# Patient Record
Sex: Male | Born: 1945 | Race: White | Hispanic: No | Marital: Married | Smoking: Former smoker
Health system: Southern US, Community
[De-identification: ages and names within clinical notes are randomized; demographics above are authoritative.]

## PROBLEM LIST (undated history)

## (undated) DIAGNOSIS — E785 Hyperlipidemia, unspecified: Secondary | ICD-10-CM

## (undated) DIAGNOSIS — I10 Essential (primary) hypertension: Secondary | ICD-10-CM

## (undated) DIAGNOSIS — I482 Chronic atrial fibrillation, unspecified: Secondary | ICD-10-CM

## (undated) DIAGNOSIS — J449 Chronic obstructive pulmonary disease, unspecified: Secondary | ICD-10-CM

## (undated) HISTORY — PX: TONSILLECTOMY: SUR1361

## (undated) HISTORY — PX: APPENDECTOMY: SHX54

## (undated) HISTORY — PX: HERNIA REPAIR: SHX51

---

## 1997-05-12 HISTORY — PX: FLEXIBLE SIGMOIDOSCOPY: SHX1649

## 2001-04-14 ENCOUNTER — Encounter: Payer: Self-pay | Admitting: Internal Medicine

## 2001-04-14 ENCOUNTER — Encounter: Admission: RE | Admit: 2001-04-14 | Discharge: 2001-04-14 | Payer: Self-pay | Admitting: Internal Medicine

## 2001-10-30 ENCOUNTER — Encounter: Payer: Self-pay | Admitting: Otolaryngology

## 2001-10-30 ENCOUNTER — Encounter: Admission: RE | Admit: 2001-10-30 | Discharge: 2001-10-30 | Payer: Self-pay | Admitting: Otolaryngology

## 2003-07-06 ENCOUNTER — Encounter: Payer: Self-pay | Admitting: Internal Medicine

## 2004-10-30 ENCOUNTER — Ambulatory Visit: Payer: Self-pay | Admitting: Internal Medicine

## 2004-10-31 ENCOUNTER — Ambulatory Visit: Payer: Self-pay | Admitting: Internal Medicine

## 2004-11-28 ENCOUNTER — Ambulatory Visit: Payer: Self-pay | Admitting: Internal Medicine

## 2005-02-26 ENCOUNTER — Ambulatory Visit: Payer: Self-pay | Admitting: Internal Medicine

## 2005-12-08 ENCOUNTER — Emergency Department (HOSPITAL_COMMUNITY): Admission: EM | Admit: 2005-12-08 | Discharge: 2005-12-08 | Payer: Self-pay | Admitting: Emergency Medicine

## 2005-12-21 ENCOUNTER — Encounter: Admission: RE | Admit: 2005-12-21 | Discharge: 2005-12-21 | Payer: Self-pay | Admitting: Orthopaedic Surgery

## 2006-01-14 ENCOUNTER — Ambulatory Visit: Payer: Self-pay | Admitting: Internal Medicine

## 2006-01-14 LAB — CONVERTED CEMR LAB
ALT: 29 units/L (ref 0–40)
AST: 27 units/L (ref 0–37)
BUN: 10 mg/dL (ref 6–23)
Basophils Relative: 1.2 % — ABNORMAL HIGH (ref 0.0–1.0)
Calcium: 9.4 mg/dL (ref 8.4–10.5)
Chloride: 103 meq/L (ref 96–112)
Cholesterol: 153 mg/dL (ref 0–200)
Creatinine, Ser: 1 mg/dL (ref 0.4–1.5)
Eosinophil percent: 2.1 % (ref 0.0–5.0)
GFR calc non Af Amer: 82 mL/min
HCT: 43 % (ref 39.0–52.0)
HDL: 62.5 mg/dL (ref >39.0)
Hemoglobin: 14.7 g/dL (ref 13.0–17.0)
Lymphocytes Relative: 28.1 % (ref 12.0–46.0)
MCHC: 34.3 g/dL (ref 30.0–36.0)
MCV: 98.2 fL (ref 78.0–100.0)
Monocytes Absolute: 0.6 10*3/uL (ref 0.2–0.7)
Neutro Abs: 3.4 10*3/uL (ref 1.4–7.7)
Neutrophils Relative %: 59 % (ref 43.0–77.0)
PSA: 0.26 ng/mL (ref 0.10–4.00)
Triglyceride fasting, serum: 77 mg/dL (ref 0–149)
VLDL: 15 mg/dL (ref 0–40)
WBC: 5.9 10*3/uL (ref 4.5–10.5)

## 2007-01-26 ENCOUNTER — Telehealth (INDEPENDENT_AMBULATORY_CARE_PROVIDER_SITE_OTHER): Payer: Self-pay | Admitting: *Deleted

## 2007-02-02 ENCOUNTER — Ambulatory Visit: Payer: Self-pay | Admitting: Internal Medicine

## 2007-02-02 DIAGNOSIS — N138 Other obstructive and reflux uropathy: Secondary | ICD-10-CM

## 2007-02-02 DIAGNOSIS — I4891 Unspecified atrial fibrillation: Secondary | ICD-10-CM

## 2007-02-02 DIAGNOSIS — N401 Enlarged prostate with lower urinary tract symptoms: Secondary | ICD-10-CM

## 2007-02-02 DIAGNOSIS — J45909 Unspecified asthma, uncomplicated: Secondary | ICD-10-CM

## 2007-02-03 ENCOUNTER — Encounter (INDEPENDENT_AMBULATORY_CARE_PROVIDER_SITE_OTHER): Payer: Self-pay | Admitting: *Deleted

## 2007-02-10 ENCOUNTER — Encounter (INDEPENDENT_AMBULATORY_CARE_PROVIDER_SITE_OTHER): Payer: Self-pay | Admitting: *Deleted

## 2007-02-10 ENCOUNTER — Ambulatory Visit: Payer: Self-pay | Admitting: Internal Medicine

## 2007-02-12 ENCOUNTER — Ambulatory Visit: Payer: Self-pay | Admitting: Cardiovascular Disease

## 2007-02-19 ENCOUNTER — Ambulatory Visit: Payer: Self-pay

## 2007-02-19 ENCOUNTER — Ambulatory Visit: Payer: Self-pay | Admitting: Cardiology

## 2007-02-19 ENCOUNTER — Encounter: Payer: Self-pay | Admitting: Cardiovascular Disease

## 2007-02-19 ENCOUNTER — Encounter: Payer: Self-pay | Admitting: Internal Medicine

## 2007-02-26 ENCOUNTER — Ambulatory Visit: Payer: Self-pay | Admitting: Cardiology

## 2007-03-05 ENCOUNTER — Ambulatory Visit: Payer: Self-pay | Admitting: Cardiology

## 2007-03-11 ENCOUNTER — Ambulatory Visit: Payer: Self-pay | Admitting: Cardiology

## 2007-03-20 ENCOUNTER — Ambulatory Visit: Payer: Self-pay | Admitting: Internal Medicine

## 2007-03-27 ENCOUNTER — Ambulatory Visit: Payer: Self-pay | Admitting: Cardiovascular Disease

## 2007-03-31 ENCOUNTER — Ambulatory Visit: Payer: Self-pay | Admitting: Cardiovascular Disease

## 2007-04-06 ENCOUNTER — Ambulatory Visit (HOSPITAL_COMMUNITY): Admission: RE | Admit: 2007-04-06 | Discharge: 2007-04-06 | Payer: Self-pay | Admitting: Cardiovascular Disease

## 2007-04-06 ENCOUNTER — Ambulatory Visit: Payer: Self-pay | Admitting: Cardiovascular Disease

## 2007-04-15 ENCOUNTER — Ambulatory Visit: Payer: Self-pay | Admitting: Cardiovascular Disease

## 2007-04-15 ENCOUNTER — Ambulatory Visit: Payer: Self-pay | Admitting: Internal Medicine

## 2007-04-24 ENCOUNTER — Ambulatory Visit: Payer: Self-pay

## 2007-05-06 ENCOUNTER — Ambulatory Visit: Payer: Self-pay | Admitting: Cardiovascular Disease

## 2007-05-20 ENCOUNTER — Ambulatory Visit: Payer: Self-pay | Admitting: Cardiology

## 2007-05-20 ENCOUNTER — Ambulatory Visit: Payer: Self-pay | Admitting: Cardiovascular Disease

## 2007-05-27 ENCOUNTER — Ambulatory Visit (HOSPITAL_COMMUNITY): Admission: RE | Admit: 2007-05-27 | Discharge: 2007-05-27 | Payer: Self-pay | Admitting: Cardiovascular Disease

## 2007-05-27 ENCOUNTER — Ambulatory Visit: Payer: Self-pay | Admitting: Cardiovascular Disease

## 2007-06-03 ENCOUNTER — Ambulatory Visit: Payer: Self-pay | Admitting: Cardiology

## 2007-06-08 ENCOUNTER — Ambulatory Visit: Payer: Self-pay | Admitting: Cardiovascular Disease

## 2007-07-01 ENCOUNTER — Ambulatory Visit: Payer: Self-pay | Admitting: Cardiovascular Disease

## 2007-07-23 ENCOUNTER — Ambulatory Visit: Payer: Self-pay | Admitting: Cardiology

## 2007-08-21 ENCOUNTER — Ambulatory Visit: Payer: Self-pay | Admitting: Cardiovascular Disease

## 2007-09-18 ENCOUNTER — Ambulatory Visit: Payer: Self-pay | Admitting: Internal Medicine

## 2007-10-16 ENCOUNTER — Ambulatory Visit: Payer: Self-pay | Admitting: Internal Medicine

## 2007-11-13 ENCOUNTER — Ambulatory Visit: Payer: Self-pay | Admitting: Plastic Surgery

## 2007-12-11 ENCOUNTER — Ambulatory Visit: Payer: Self-pay | Admitting: Cardiovascular Disease

## 2007-12-11 ENCOUNTER — Ambulatory Visit: Payer: Self-pay | Admitting: Internal Medicine

## 2008-01-11 ENCOUNTER — Telehealth (INDEPENDENT_AMBULATORY_CARE_PROVIDER_SITE_OTHER): Payer: Self-pay | Admitting: *Deleted

## 2008-01-12 ENCOUNTER — Encounter (INDEPENDENT_AMBULATORY_CARE_PROVIDER_SITE_OTHER): Payer: Self-pay | Admitting: *Deleted

## 2008-01-12 ENCOUNTER — Ambulatory Visit: Payer: Self-pay | Admitting: Cardiology

## 2008-01-18 ENCOUNTER — Ambulatory Visit: Payer: Self-pay | Admitting: Internal Medicine

## 2008-01-18 LAB — CONVERTED CEMR LAB: HDL goal, serum: 40 mg/dL

## 2008-01-19 ENCOUNTER — Telehealth (INDEPENDENT_AMBULATORY_CARE_PROVIDER_SITE_OTHER): Payer: Self-pay | Admitting: *Deleted

## 2008-01-20 ENCOUNTER — Ambulatory Visit: Payer: Self-pay | Admitting: Internal Medicine

## 2008-01-26 ENCOUNTER — Ambulatory Visit: Payer: Self-pay | Admitting: Internal Medicine

## 2008-01-26 LAB — CONVERTED CEMR LAB
OCCULT 2: NEGATIVE
OCCULT 3: NEGATIVE

## 2008-01-27 ENCOUNTER — Encounter (INDEPENDENT_AMBULATORY_CARE_PROVIDER_SITE_OTHER): Payer: Self-pay | Admitting: *Deleted

## 2008-02-02 ENCOUNTER — Encounter (INDEPENDENT_AMBULATORY_CARE_PROVIDER_SITE_OTHER): Payer: Self-pay | Admitting: *Deleted

## 2008-02-02 LAB — CONVERTED CEMR LAB
AST: 31 units/L (ref 0–37)
Alkaline Phosphatase: 52 units/L (ref 39–117)
BUN: 12 mg/dL (ref 6–23)
CO2: 30 meq/L (ref 19–32)
Chloride: 104 meq/L (ref 96–112)
Eosinophils Absolute: 0.1 10*3/uL (ref 0.0–0.7)
Eosinophils Relative: 0.9 % (ref 0.0–5.0)
GFR calc non Af Amer: 73 mL/min
HDL: 51.6 mg/dL (ref >39.0)
LDL Cholesterol: 90 mg/dL (ref 0–99)
Lymphocytes Relative: 27.8 % (ref 12.0–46.0)
MCV: 97.8 fL (ref 78.0–100.0)
Neutrophils Relative %: 58.6 % (ref 43.0–77.0)
Platelets: 209 10*3/uL (ref 150–400)
Potassium: 4.4 meq/L (ref 3.5–5.1)
Total Bilirubin: 1.1 mg/dL (ref 0.3–1.2)
Total CHOL/HDL Ratio: 3
VLDL: 14 mg/dL (ref 0–40)
WBC: 6.4 10*3/uL (ref 4.5–10.5)

## 2008-02-09 ENCOUNTER — Ambulatory Visit: Payer: Self-pay | Admitting: Cardiology

## 2008-03-21 ENCOUNTER — Ambulatory Visit: Payer: Self-pay | Admitting: Cardiovascular Disease

## 2008-03-21 ENCOUNTER — Encounter: Payer: Self-pay | Admitting: Cardiovascular Disease

## 2008-04-18 ENCOUNTER — Ambulatory Visit: Payer: Self-pay | Admitting: Internal Medicine

## 2008-05-16 ENCOUNTER — Ambulatory Visit: Payer: Self-pay | Admitting: Internal Medicine

## 2008-06-07 ENCOUNTER — Encounter: Payer: Self-pay | Admitting: *Deleted

## 2008-06-13 ENCOUNTER — Ambulatory Visit: Payer: Self-pay | Admitting: Cardiology

## 2008-06-13 LAB — CONVERTED CEMR LAB: POC INR: 2.4

## 2008-07-12 ENCOUNTER — Ambulatory Visit: Payer: Self-pay | Admitting: Cardiology

## 2008-07-12 ENCOUNTER — Encounter (INDEPENDENT_AMBULATORY_CARE_PROVIDER_SITE_OTHER): Payer: Self-pay | Admitting: Cardiology

## 2008-07-12 LAB — CONVERTED CEMR LAB: Prothrombin Time: 22.7 s

## 2008-07-13 ENCOUNTER — Encounter: Payer: Self-pay | Admitting: *Deleted

## 2008-08-09 ENCOUNTER — Ambulatory Visit: Payer: Self-pay | Admitting: Cardiovascular Disease

## 2008-09-06 ENCOUNTER — Ambulatory Visit: Payer: Self-pay | Admitting: Cardiovascular Disease

## 2008-10-04 ENCOUNTER — Ambulatory Visit: Payer: Self-pay | Admitting: Cardiology

## 2008-11-01 ENCOUNTER — Ambulatory Visit: Payer: Self-pay | Admitting: Cardiology

## 2008-11-29 ENCOUNTER — Ambulatory Visit: Payer: Self-pay | Admitting: Cardiology

## 2008-11-29 LAB — CONVERTED CEMR LAB: POC INR: 2.6

## 2008-12-27 ENCOUNTER — Ambulatory Visit: Payer: Self-pay | Admitting: Cardiology

## 2008-12-27 LAB — CONVERTED CEMR LAB: POC INR: 2.7

## 2009-01-07 HISTORY — PX: COLONOSCOPY: SHX174

## 2009-01-16 ENCOUNTER — Telehealth (INDEPENDENT_AMBULATORY_CARE_PROVIDER_SITE_OTHER): Payer: Self-pay | Admitting: *Deleted

## 2009-01-23 ENCOUNTER — Ambulatory Visit: Payer: Self-pay | Admitting: Internal Medicine

## 2009-01-23 DIAGNOSIS — Z8739 Personal history of other diseases of the musculoskeletal system and connective tissue: Secondary | ICD-10-CM

## 2009-01-23 DIAGNOSIS — E785 Hyperlipidemia, unspecified: Secondary | ICD-10-CM | POA: Insufficient documentation

## 2009-01-24 ENCOUNTER — Encounter (INDEPENDENT_AMBULATORY_CARE_PROVIDER_SITE_OTHER): Payer: Self-pay | Admitting: *Deleted

## 2009-02-03 ENCOUNTER — Ambulatory Visit: Payer: Self-pay | Admitting: Gastroenterology

## 2009-02-03 ENCOUNTER — Encounter (INDEPENDENT_AMBULATORY_CARE_PROVIDER_SITE_OTHER): Payer: Self-pay | Admitting: *Deleted

## 2009-02-15 ENCOUNTER — Ambulatory Visit: Payer: Self-pay | Admitting: Gastroenterology

## 2009-02-24 ENCOUNTER — Ambulatory Visit: Payer: Self-pay | Admitting: Cardiology

## 2009-03-14 ENCOUNTER — Telehealth (INDEPENDENT_AMBULATORY_CARE_PROVIDER_SITE_OTHER): Payer: Self-pay | Admitting: *Deleted

## 2009-03-24 ENCOUNTER — Ambulatory Visit: Payer: Self-pay | Admitting: Cardiology

## 2009-04-21 ENCOUNTER — Ambulatory Visit: Payer: Self-pay | Admitting: Internal Medicine

## 2009-04-21 LAB — CONVERTED CEMR LAB: POC INR: 1.9

## 2009-05-19 ENCOUNTER — Ambulatory Visit: Payer: Self-pay | Admitting: Cardiovascular Disease

## 2009-06-16 ENCOUNTER — Ambulatory Visit: Payer: Self-pay | Admitting: Cardiology

## 2009-07-14 ENCOUNTER — Ambulatory Visit: Payer: Self-pay | Admitting: Cardiovascular Disease

## 2009-08-11 ENCOUNTER — Ambulatory Visit: Payer: Self-pay | Admitting: Cardiovascular Disease

## 2009-08-11 LAB — CONVERTED CEMR LAB: POC INR: 2

## 2009-09-13 ENCOUNTER — Encounter: Payer: Self-pay | Admitting: Internal Medicine

## 2009-09-14 ENCOUNTER — Ambulatory Visit: Payer: Self-pay | Admitting: Cardiovascular Disease

## 2009-09-14 ENCOUNTER — Ambulatory Visit: Payer: Self-pay | Admitting: Internal Medicine

## 2009-09-14 LAB — CONVERTED CEMR LAB: POC INR: 2.2

## 2009-10-11 ENCOUNTER — Ambulatory Visit: Payer: Self-pay | Admitting: Cardiovascular Disease

## 2009-10-11 LAB — CONVERTED CEMR LAB: POC INR: 2.6

## 2009-11-09 ENCOUNTER — Ambulatory Visit: Payer: Self-pay | Admitting: Cardiology

## 2009-11-09 LAB — CONVERTED CEMR LAB: POC INR: 2.5

## 2009-12-07 ENCOUNTER — Ambulatory Visit: Payer: Self-pay | Admitting: Cardiology

## 2009-12-07 LAB — CONVERTED CEMR LAB: POC INR: 2.5

## 2010-01-11 ENCOUNTER — Ambulatory Visit: Admission: RE | Admit: 2010-01-11 | Discharge: 2010-01-11 | Payer: Self-pay | Source: Home / Self Care

## 2010-01-11 LAB — CONVERTED CEMR LAB: POC INR: 2.6

## 2010-01-26 ENCOUNTER — Encounter: Payer: Self-pay | Admitting: Internal Medicine

## 2010-01-26 ENCOUNTER — Ambulatory Visit
Admission: RE | Admit: 2010-01-26 | Discharge: 2010-01-26 | Payer: Self-pay | Source: Home / Self Care | Attending: Internal Medicine | Admitting: Internal Medicine

## 2010-01-26 DIAGNOSIS — J309 Allergic rhinitis, unspecified: Secondary | ICD-10-CM | POA: Insufficient documentation

## 2010-02-04 LAB — CONVERTED CEMR LAB
ALT: 29 units/L (ref 0–53)
Albumin: 4 g/dL (ref 3.5–5.2)
BUN: 13 mg/dL (ref 6–23)
Basophils Absolute: 0 10*3/uL (ref 0.0–0.1)
Basophils Absolute: 0.1 10*3/uL (ref 0.0–0.1)
Bilirubin, Direct: 0.2 mg/dL (ref 0.0–0.3)
Bilirubin, Direct: 0.3 mg/dL (ref 0.0–0.3)
CK-MB: 1.8 ng/mL (ref 0.3–4.0)
CO2: 28 meq/L (ref 19–32)
Chloride: 103 meq/L (ref 96–112)
Cholesterol: 135 mg/dL (ref 0–200)
Cholesterol: 164 mg/dL (ref 0–200)
Creatinine, Ser: 1.1 mg/dL (ref 0.4–1.5)
Eosinophils Absolute: 0.1 10*3/uL (ref 0.0–0.6)
Eosinophils Absolute: 0.1 10*3/uL (ref 0.0–0.7)
Eosinophils Relative: 1.2 % (ref 0.0–5.0)
Free T4: 0.7 ng/dL (ref 0.6–1.6)
GFR calc Af Amer: 88 mL/min
GFR calc non Af Amer: 73 mL/min
Glucose, Bld: 107 mg/dL — ABNORMAL HIGH (ref 70–99)
Glucose, Bld: 83 mg/dL (ref 70–99)
HCT: 44.7 % (ref 39.0–52.0)
HDL goal, serum: 40 mg/dL
Hemoglobin: 15.7 g/dL (ref 13.0–17.0)
INR: 2.5
LDL Goal: 160 mg/dL
Lymphocytes Relative: 21.7 % (ref 12.0–46.0)
Lymphocytes Relative: 25.7 % (ref 12.0–46.0)
MCHC: 33.3 g/dL (ref 30.0–36.0)
MCHC: 33.7 g/dL (ref 30.0–36.0)
MCV: 97.4 fL (ref 78.0–100.0)
Neutro Abs: 3.9 10*3/uL (ref 1.4–7.7)
Neutro Abs: 4.9 10*3/uL (ref 1.4–7.7)
Neutrophils Relative %: 62.9 % (ref 43.0–77.0)
PSA: 0.21 ng/mL (ref 0.10–4.00)
Potassium: 4.7 meq/L (ref 3.5–5.1)
RBC: 4.59 M/uL (ref 4.22–5.81)
RDW: 12.1 % (ref 11.5–14.6)
Sodium: 138 meq/L (ref 135–145)
TSH: 1.24 microintl units/mL (ref 0.35–5.50)
TSH: 1.4 microintl units/mL (ref 0.35–5.50)
Total CHOL/HDL Ratio: 2.4
Total CK: 78 units/L (ref 7–195)
Total Protein: 7.2 g/dL (ref 6.0–8.3)
Triglycerides: 55 mg/dL (ref 0–149)
Triglycerides: 83 mg/dL (ref 0.0–149.0)
Uric Acid, Serum: 8.3 mg/dL — ABNORMAL HIGH (ref 4.0–7.8)
WBC: 6.2 10*3/uL (ref 4.5–10.5)

## 2010-02-06 ENCOUNTER — Ambulatory Visit: Admission: RE | Admit: 2010-02-06 | Discharge: 2010-02-06 | Payer: Self-pay | Source: Home / Self Care

## 2010-02-06 ENCOUNTER — Ambulatory Visit: Admit: 2010-02-06 | Payer: Self-pay | Admitting: Internal Medicine

## 2010-02-06 ENCOUNTER — Ambulatory Visit
Admission: RE | Admit: 2010-02-06 | Discharge: 2010-02-06 | Payer: Self-pay | Source: Home / Self Care | Attending: Internal Medicine | Admitting: Internal Medicine

## 2010-02-06 ENCOUNTER — Other Ambulatory Visit: Payer: Self-pay | Admitting: Internal Medicine

## 2010-02-06 LAB — CBC WITH DIFFERENTIAL/PLATELET
Basophils Absolute: 0 10*3/uL (ref 0.0–0.1)
Eosinophils Absolute: 0.1 10*3/uL (ref 0.0–0.7)
Lymphocytes Relative: 15.3 % (ref 12.0–46.0)
MCHC: 34.7 g/dL (ref 30.0–36.0)
Neutrophils Relative %: 73.7 % (ref 43.0–77.0)
Platelets: 218 10*3/uL (ref 150.0–400.0)
RBC: 4.18 Mil/uL — ABNORMAL LOW (ref 4.22–5.81)
RDW: 12 % (ref 11.5–14.6)

## 2010-02-06 LAB — BASIC METABOLIC PANEL
Calcium: 8.9 mg/dL (ref 8.4–10.5)
Creatinine, Ser: 1 mg/dL (ref 0.4–1.5)
GFR: 79.32 mL/min (ref >60.00)
Glucose, Bld: 91 mg/dL (ref 70–99)
Sodium: 138 mEq/L (ref 135–145)

## 2010-02-06 LAB — HEPATIC FUNCTION PANEL
ALT: 21 U/L (ref 0–53)
AST: 25 U/L (ref 0–37)
Albumin: 3.7 g/dL (ref 3.5–5.2)
Alkaline Phosphatase: 52 U/L (ref 39–117)

## 2010-02-06 LAB — PSA: PSA: 0.37 ng/mL (ref 0.10–4.00)

## 2010-02-06 LAB — LIPID PANEL
HDL: 50.9 mg/dL (ref >39.00)
Triglycerides: 65 mg/dL (ref 0.0–149.0)

## 2010-02-06 NOTE — Medication Information (Signed)
Summary: rov/cb  Anticoagulant Therapy  Managed by: [name], PharmD Referring MD: [name] PCP: [name], MD Supervising MD: Eden Emms MD, Theron Arista Indication 1: Atrial Fibrillation (ICD-427.31) Lab Used: [facility] Site: [facility] INR POC 2.2 INR RANGE 2 - 3  Dietary changes: no    Health status changes: no    Bleeding/hemorrhagic complications: no    Recent/future hospitalizations: no    Any changes in medication regimen? no    Recent/future dental: no  Any missed doses?: no       Is patient compliant with meds? yes       Allergies: No Known Drug Allergies  Anticoagulation Management History:      The patient is taking warfarin and comes in today for a routine follow up visit.  Negative risk factors for bleeding include an age less than 41 years old and no history of CVA/TIA.  The bleeding index is 'low risk'.  Positive CHADS2 values include History of HTN.  Negative CHADS2 values include Age > 89 years old, History of Diabetes, and Prior Stroke/CVA/TIA.  The start date was 02/12/2007.  His last INR was 2.5.  Anticoagulation responsible provider: Eden Emms MD, Theron Arista.  INR POC: 2.2.  Cuvette Lot#: [id number].  Exp: 09/2010.    Anticoagulation Management Assessment/Plan:      The patient's current anticoagulation dose is Coumadin 5 mg tabs: Use as directed by anticoagulation clinic..  The target INR is 2 - 3.  The next INR is due 08/11/2009.  Anticoagulation instructions were given to patient.  Results were reviewed/authorized by [name], PharmD.  He was notified by [name] PharmD.         Prior Anticoagulation Instructions: INR 2.6. Take 1 tablet daily except 1.5 tablets on Sat.  Recheck in 4 weeks.  Current Anticoagulation Instructions: INR 2.2  Continue same dose of 1 tablet every day except 1 1/ 2 tablets on Saturday.

## 2010-02-06 NOTE — Letter (Signed)
Summary: Care Consideration Regarding Pulmonary Rehab/[facility]  Care Consideration Regarding Pulmonary Rehab/[facility]   Imported By: Lanelle Bal 10/02/2009 12:57:22  _____________________________________________________________________  External Attachment:    Type:   Image     Comment:   External Document

## 2010-02-06 NOTE — Medication Information (Signed)
Summary: rov/sp  Anticoagulant Therapy  Managed by: [name], PharmD Referring MD: [name] PCP: [name], MD Supervising MD: Eden Emms MD, Theron Arista Indication 1: Atrial Fibrillation (ICD-427.31) Lab Used: [facility] Site: [facility] INR POC 2.0 INR RANGE 2 - 3  Dietary changes: no    Health status changes: no    Bleeding/hemorrhagic complications: no    Recent/future hospitalizations: no    Any changes in medication regimen? no    Recent/future dental: no  Any missed doses?: no       Is patient compliant with meds? yes       Allergies: No Known Drug Allergies  Anticoagulation Management History:      The patient is taking warfarin and comes in today for a routine follow up visit.  Negative risk factors for bleeding include an age less than 66 years old and no history of CVA/TIA.  The bleeding index is 'low risk'.  Positive CHADS2 values include History of HTN.  Negative CHADS2 values include Age > 21 years old, History of Diabetes, and Prior Stroke/CVA/TIA.  The start date was 02/12/2007.  His last INR was 2.5.  Anticoagulation responsible Obe Ahlers: Eden Emms MD, Theron Arista.  INR POC: 2.0.  Cuvette Lot#: [id number].  Exp: 10/2010.    Anticoagulation Management Assessment/Plan:      The patient's current anticoagulation dose is Coumadin 5 mg tabs: Use as directed by anticoagulation clinic..  The target INR is 2 - 3.  The next INR is due 09/14/2009.  Anticoagulation instructions were given to patient.  Results were reviewed/authorized by [name], PharmD.  He was notified by Gweneth Fritter, PharmD Candidate.         Prior Anticoagulation Instructions: INR 2.2  Continue same dose of 1 tablet every day except 1 1/ 2 tablets on Saturday.   Current Anticoagulation Instructions: INR- 2.0  Continue taking 1 tablet (5mg ) daily except take 1.5 tablets (7.5mg ) on Sat.

## 2010-02-06 NOTE — Assessment & Plan Note (Signed)
Summary: 6 mo f/u /cy  Medications Added LIPITOR 20 MG  TABS (ATORVASTATIN CALCIUM) 1 tablet mon ,wed, fri, sunday ASPIRIN 81 MG  TABS (ASPIRIN) 1 tab by mouth once daily      Allergies Added: NKDA  Primary Provider:  [name], MD  CC:  pt has been having some muscle pain.  History of Present Illness: Reginald Green is seen today in followup.   Patient has chronic atrial fibrillation.  He has failed multiple cardioversions in the past. We  have not  referred him for afib ablatin in the past  since he is asymptomatic and doing well   He has been in afib for about 3 years.  We discussed  Pradaxa as well and until there is an "antidote" I would not change especially since his INR's have been very consistant  He bruises easily on Coumadin He had a benign colonoscopy recently and no problems off coumadin with no bridge In February of 09 he had a normal stress Myoview and a normal echo.  Is not having any significant chest pain palpitations PND orthopnea dyspnea no syncope lower extremity edema or claudication.  His hypertension has been under good control.  Is trying to watch the salt in his diet.  Current Problems (verified): 1)  Gout  (ICD-274.9) 2)  Hyperlipidemia  (ICD-272.4) 3)  Coumadin Therapy  (ICD-V58.61) 4)  Atrial Fibrillation  (ICD-427.31) 5)  Special Screening For Malignant Neoplasms Colon  (ICD-V76.51) 6)  Hyperplasia Prostate Uns w/o Ur Obst & Oth Luts  (ICD-600.90) 7)  Atrial Fibrillation  (ICD-427.31) 8)  Other and Unspecified Hyperlipidemia  (ICD-272.4) 9)  Unspecified Essential Hypertension  (ICD-401.9) 10)  Asthma  (ICD-493.90)  Current Medications (verified): 1)  Advair Diskus 250-50 Mcg/dose  Misc (Fluticasone-Salmeterol) .... Inhale 1 Puff By Mouth Every 12 Hours, 2)  Lisinopril 40 Mg  Tabs (Lisinopril) .... 1/2 Tab Daily 3)  Metoprolol Tartrate 50 Mg  Tabs (Metoprolol Tartrate) .... 1/2 By Mouth Two Times A Day 4)  Lipitor 20 Mg  Tabs (Atorvastatin Calcium) .Marland Kitchen.. 1  Tablet Mon ,wed, Fri, Sunday 5)  Aspirin 81 Mg  Tabs (Aspirin) .Marland Kitchen.. 1 Tab By Mouth Once Daily 6)  Proair Hfa 108 (90 Base) Mcg/act  Aers (Albuterol Sulfate) .Marland Kitchen.. 1-2 Puffs Q 4-6 Hrs Prn 7)  Multivitamins  Tabs (Multiple Vitamin) .Marland Kitchen.. 1 By Mouth Once Daily 8)  Coumadin 5 Mg Tabs (Warfarin Sodium) .... Use As Directed By Anticoagulation Clinic.  Allergies (verified): No Known Drug Allergies  Past History:  Past Medical History: Last updated: 02/03/2009 atrial fibrillation PVC and  excess hypertensive response at treadmill stress test 12/25/1998 Hypertension elevated uric acid, PMH of Gilberts syndrome false positive HIV 2004 @ [facility] Hyperlipidemia Tinnitus Gout COPD  Past Surgical History: Last updated: 02/03/2009 hernia repair 1979 undescended testicle-testicle remans, it was brought down at herniorrhaphy 1979; incidental appendectomy done @ same surgery 1979 Tonsillectomy colonoscopy negative 2001, due 2011 dislocated left shoulder  S/P Physical Therapy 12/2005 Cardioversions , multiple, unsuccessful, Dr Eden Emms Appendectomy  Family History: Last updated: 02/03/2009 maternal uncle 2 vessel CABG, carotid endarectomy mother CVA ,endarterectomy, HTN maternal grandmotherCVA, diabetes maternal grandfather MI @ 10; M uncle MI <55,CBAG No FH of Colon Cancer: Family History of Colon Polyps:Mother Family History of Heart Disease: Father  Social History: Last updated: 02/03/2009 Former Smoker: quit age 61 Alcohol use-yes occasionally Regular exercise-yes: walking 2-3 mpd > 3X/week Occupation: Emergency planning/management officer Daily Caffeine Use 2-3  Review of Systems  Denies fever, malais, weight loss, blurry vision, decreased visual acuity, cough, sputum, SOB, hemoptysis, pleuritic pain, palpitaitons, heartburn, abdominal pain, melena, lower extremity edema, claudication, or rash.   Vital Signs:  Patient profile:   65 year old male Height:      74 inches Weight:       223 pounds BMI:     28.73 Pulse rate:   70 / minute Pulse rhythm:   irregularly irregular Resp:     14 per minute BP sitting:   118 / 80  (left arm)  Vitals Entered By: Kem Parkinson (September 14, 2009 3:42 PM)  Physical Exam  General:  Affect appropriate Healthy:  appears stated age HEENT: normal Neck supple with no adenopathy JVP normal no bruits no thyromegaly Lungs clear with no wheezing and good diaphragmatic motion Heart:  S1/S2 no murmur,rub, gallop or click PMI normal Abdomen: benighn, BS positve, no tenderness, no AAA no bruit.  No HSM or HJR Distal pulses intact with no bruits No edema Neuro non-focal Skin warm and dry    Impression & Recommendations:  Problem # 1:  HYPERLIPIDEMIA (ICD-272.4) At goal with no side effects His updated medication list for this problem includes:    Lipitor 20 Mg Tabs (Atorvastatin calcium) .Marland Kitchen... 1 tablet mon ,wed, fri, sunday  CHOL: 164 (01/23/2009)   LDL: 84 (01/23/2009)   HDL: 63.00 (01/23/2009)   TG: 83.0 (01/23/2009) CHOL (goal): 200 (01/18/2008)   LDL (goal): 120 (01/18/2008)   HDL (goal): 40 (01/18/2008)   TG (goal): 150 (01/18/2008)  Problem # 2:  COUMADIN THERAPY (ICD-V58.61) F/U coumadin clinic today.  No bleeding problems  Problem # 3:  ATRIAL FIBRILLATION (ICD-427.31) Good rate control and anticoagulaiton  Asymptomatic His updated medication list for this problem includes:    Metoprolol Tartrate 50 Mg Tabs (Metoprolol tartrate) .Marland Kitchen... 1/2 by mouth two times a day    Aspirin 81 Mg Tabs (Aspirin) .Marland Kitchen... 1 tab by mouth once daily    Coumadin 5 Mg Tabs (Warfarin sodium) ..... Use as directed by anticoagulation clinic.  Problem # 4:  UNSPECIFIED ESSENTIAL HYPERTENSION (ICD-401.9) Well controlled continue low sodium diet His updated medication list for this problem includes:    Lisinopril 40 Mg Tabs (Lisinopril) .Marland Kitchen... 1/2 tab daily    Metoprolol Tartrate 50 Mg Tabs (Metoprolol tartrate) .Marland Kitchen... 1/2 by mouth two times a  day    Aspirin 81 Mg Tabs (Aspirin) .Marland Kitchen... 1 tab by mouth once daily  Patient Instructions: 1)  Your physician recommends that you schedule a follow-up appointment in: ONE YEAR

## 2010-02-06 NOTE — Medication Information (Signed)
Summary: rov/nb   Anticoagulant Therapy  Managed by: [name], PharmD Referring MD: [name] PCP: [name], MD Supervising MD: Jens Som MD, Arlys Aldous Indication 1: Atrial Fibrillation (ICD-427.31) Lab Used: [facility] Site: [facility] INR POC 2.5 INR RANGE 2 - 3  Dietary changes: no    Health status changes: no    Bleeding/hemorrhagic complications: no    Recent/future hospitalizations: no    Any changes in medication regimen? no    Recent/future dental: no  Any missed doses?: no       Is patient compliant with meds? yes       Allergies: No Known Drug Allergies  Anticoagulation Management History:      The patient is taking warfarin and comes in today for a routine follow up visit.  Negative risk factors for bleeding include an age less than 42 years old and no history of CVA/TIA.  The bleeding index is 'low risk'.  Positive CHADS2 values include History of HTN.  Negative CHADS2 values include Age > 67 years old, History of Diabetes, and Prior Stroke/CVA/TIA.  The start date was 02/12/2007.  His last INR was 2.5.  Anticoagulation responsible provider: Jens Som MD, Arlys Mourad.  INR POC: 2.5.  Cuvette Lot#: [id number].  Exp: 12/2010.    Anticoagulation Management Assessment/Plan:      The patient's current anticoagulation dose is Coumadin 5 mg tabs: Use as directed by anticoagulation clinic..  The target INR is 2 - 3.  The next INR is due 01/11/2010.  Anticoagulation instructions were given to patient.  Results were reviewed/authorized by [name], PharmD.  He was notified by [name] PharmD.         Prior Anticoagulation Instructions: INR 2.5  Continue previous dose of 1 tablet daily except 1.5 tablets on Saturday.  Recheck INR in 4 weeks.   Current Anticoagulation Instructions: INR 2.5  Continue same dose of 1 tablet every day except 1 1/2 tablets on Saturday.  Recheck INR in 4 weeks.

## 2010-02-06 NOTE — Letter (Signed)
Summary: [facility] Instructions  [facility]  [address] [place], [place] [address]   Phone: [phone]  Fax: [phone]       Reginald Green    12/19/1945    MRN: [id number]        Procedure Day Dorna Bloom: Wednesday, 02/15/09     Arrival Time: 10;00      Procedure Time: 11;00     Location of Procedure:                    _x _   [facility] ([facility])                        PREPARATION FOR COLONOSCOPY WITH MOVIPREP   Starting 5 days prior to your procedure 02/10/09 do not eat nuts, seeds, popcorn, corn, beans, peas,  salads, or any raw vegetables.  Do not take any fiber supplements (e.g. Metamucil, Citrucel, and Benefiber).  THE DAY BEFORE YOUR PROCEDURE         DATE: 02/14/09   DAY: Tuesday  1.  Drink clear liquids the entire day-NO SOLID FOOD  2.  Do not drink anything colored red or purple.  Avoid juices with pulp.  No orange juice.  3.  Drink at least 64 oz. (8 glasses) of fluid/clear liquids during the day to prevent dehydration and help the prep work efficiently.  CLEAR LIQUIDS INCLUDE: Water Jello Ice Popsicles Tea (sugar ok, no milk/cream) Powdered fruit flavored drinks Coffee (sugar ok, no milk/cream) Gatorade Juice: apple, white grape, white cranberry  Lemonade Clear bullion, consomm, broth Carbonated beverages (any kind) Strained chicken noodle soup Hard Candy                             4.  In the morning, mix first dose of MoviPrep solution:    Empty 1 Pouch A and 1 Pouch B into the disposable container    Add lukewarm drinking water to the top line of the container. Mix to dissolve    Refrigerate (mixed solution should be used within 24 hrs)  5.  Begin drinking the prep at 5:00 p.m. The MoviPrep container is divided by 4 marks.   Every 15 minutes drink the solution down to the next mark (approximately 8 oz) until the full liter is complete.   6.  Follow completed prep with 16 oz of clear liquid of your choice (Nothing  red or purple).  Continue to drink clear liquids until bedtime.  7.  Before going to bed, mix second dose of MoviPrep solution:    Empty 1 Pouch A and 1 Pouch B into the disposable container    Add lukewarm drinking water to the top line of the container. Mix to dissolve    Refrigerate  THE DAY OF YOUR PROCEDURE      DATE: 02/15/09  DAY: Wednesday  Beginning at 6:00 a.m. (5 hours before procedure):         1. Every 15 minutes, drink the solution down to the next mark (approx 8 oz) until the full liter is complete.  2. Follow completed prep with 16 oz. of clear liquid of your choice.    3. You may drink clear liquids until 9:00 (2 HOURS BEFORE PROCEDURE).   MEDICATION INSTRUCTIONS  Unless otherwise instructed, you should take regular prescription medications with a small sip of water   as early as possible the morning of  your procedure.     Stop taking Coumadin on  02/10/09  (5 days before procedure).           OTHER INSTRUCTIONS  You will need a responsible adult at least 63 years of age to accompany you and drive you home.   This person must remain in the waiting room during your procedure.  Wear loose fitting clothing that is easily removed.  Leave jewelry and other valuables at home.  However, you may wish to bring a book to read or  an iPod/MP3 player to listen to music as you wait for your procedure to start.  Remove all body piercing jewelry and leave at home.  Total time from sign-in until discharge is approximately 2-3 hours.  You should go home directly after your procedure and rest.  You can resume normal activities the  day after your procedure.  The day of your procedure you should not:   Drive   Make legal decisions   Operate machinery   Drink alcohol   Return to work  You will receive specific instructions about eating, activities and medications before you leave.    The above instructions have been reviewed and explained to me by    _______________________    I fully understand and can verbalize these instructions _____________________________ Date _________

## 2010-02-06 NOTE — Progress Notes (Signed)
Summary: refill  Phone Note Refill Request Message from:  Fax from Pharmacy on [address] fax [phone]  Refills Requested: Medication #1:  METOPROLOL TARTRATE 50 MG  TABS 1/2 by mouth two times a day Initial call taken by: Barb Merino,  January 16, 2009 8:37 AM  Follow-up for Phone Call        pt has pending OV 01-24-08............Marland KitchenFelecia Deloach CMA  January 16, 2009 11:01 AM     New/Updated Medications: METOPROLOL TARTRATE 50 MG  TABS (METOPROLOL TARTRATE) 1/2 by mouth two times a day*OFFICE VISIT DUE NOW** Prescriptions: METOPROLOL TARTRATE 50 MG  TABS (METOPROLOL TARTRATE) 1/2 by mouth two times a day*OFFICE VISIT DUE NOW**  #30 x 0   Entered by:   Jeremy Johann CMA   Authorized by:   [name] MD   Signed by:   Jeremy Johann CMA on 01/16/2009   Method used:   Faxed to ...       [facility]* (retail)       [address] [place], [place]  [address]       Ph: [phone]       Fax: [phone]   RxID:   [id number]

## 2010-02-06 NOTE — Assessment & Plan Note (Signed)
Summary: med refiill//fd   Vital Signs:  Patient profile:   65 year old male Height:      74 inches Weight:      227 pounds BMI:     29.25 Resp:     15 per minute BP sitting:   130 / 84  (left arm) Cuff size:   large  Vitals Entered By: Shonna Chock (January 23, 2009 10:48 AM) CC: CPX with fasting labs and refill medications, Hypertension Management Comments REVIEWED MED LIST, PATIENT AGREED DOSE AND INSTRUCTION CORRECT    CC:  CPX with fasting labs and refill medications and Hypertension Management.  History of Present Illness: Reginald Green is here for med refill;he is asymptomatic.  Hypertension History:      He complains of neurologic problems, but denies headache, chest pain, palpitations, dyspnea with exertion, orthopnea, PND, peripheral edema, visual symptoms, syncope, and side effects from treatment.  BP @ home usually 130/75-80. Minor epistaxis. No claudication.        Positive major cardiovascular risk factors include male age 66 years old or older, hyperlipidemia, hypertension, and family history for ischemic heart disease (males less than 49 years old).  Negative major cardiovascular risk factors include no history of diabetes and non-tobacco-user status.        Further assessment for target organ damage reveals no history of ASHD, stroke/TIA, or peripheral vascular disease.     Allergies (verified): No Known Drug Allergies  Past History:  Past Medical History: atrial fibrillation PVC and  excess hypertensive response at treadmill stress test 12/25/1998 Hypertension elevated uric acid, PMH of Gilberts syndrome false positive HIV 2004 @ [facility] Hyperlipidemia Tinnitus Gout  Past Surgical History: hernia repair 1979 undescended testicle-testicle remans, it was brought down at herniorrhaphy 1979; incidental appendectomy done @ same surgery 1979 Tonsillectomy colonoscopy negative 2001, due 2011 dislocated left shoulder  S/P Physical Therapy  12/2005 Cardioversions , multiple, unsuccessful, Dr Eden Emms  Family History: maternal uncle 2 vessel CABG, carotid endarectomy mother CVA ,endarterectomy, HTN maternal grandmotherCVA, diabetes maternal grandfather MI @ 47; M uncle MI <55,CBAG  Social History: Former Smoker: quit age 82 Alcohol use-yes occasionally Regular exercise-yes: walking 2-3 mpd > 3X/week Occupation: Emergency planning/management officer  Review of Systems  The patient denies anorexia, fever, weight loss, weight gain, decreased hearing, hoarseness, abdominal pain, melena, hematochezia, severe indigestion/heartburn, hematuria, incontinence, suspicious skin lesions, depression, unusual weight change, abnormal bleeding, enlarged lymph nodes, and angioedema.   Eyes:  Denies blurring, double vision, and vision loss-both eyes. CV:  Denies leg cramps with exertion, lightheadness, and near fainting. Resp:  Complains of sputum productive; denies cough, excessive snoring, hypersomnolence, morning headaches, shortness of breath, and wheezing; Rarely uses rescue MDI. Phlegm in am & with walking. Neuro:  Complains of numbness and tingling; denies disturbances in coordination and poor balance; Occasional N&T in hands @ night.  Physical Exam  General:  well-nourished,in no acute distress; alert,appropriate and cooperative throughout examination Head:  Normocephalic and atraumatic without obvious abnormalities. No apparent alopecia; moustache &  beard Neck:  No deformities, masses, or tenderness noted. Lungs:  Normal respiratory effort, chest expands symmetrically. Lungs are clear to auscultation, no crackles or wheezes. Heart:  bradycardia and irregular rhythm.   Abdomen:  Bowel sounds positive,abdomen soft and non-tender without masses, organomegaly or hernias noted. Rectal:  No external abnormalities noted. Normal sphincter tone. No rectal masses or tenderness. Genitalia:  R testicle is high.L varicocele.   Prostate:  no nodules, no asymmetry, no  induration, but  1+ enlarged.  Pulses:  R and L carotid,radial  and posterior tibial pulses are full and equal bilaterally. Decreased DPP Extremities:  No clubbing, cyanosis, edema. Deformed toenails Neurologic:  alert & oriented X3 and DTRs symmetrical and normal.   Skin:  Intact without suspicious lesions or rashes Cervical Nodes:  No lymphadenopathy noted Axillary Nodes:  No palpable lymphadenopathy Psych:  memory intact for recent and remote, normally interactive, and good eye contact.     Impression & Recommendations:  Problem # 1:  HYPERLIPIDEMIA (ICD-272.4)  His updated medication list for this problem includes:    Lipitor 20 Mg Tabs (Atorvastatin calcium) .Marland Kitchen... 1 by mouth mon,wed,frid, sun  Orders: Venipuncture ([address]) TLB-Lipid Panel (80061-LIPID) TLB-Hepatic/Liver Function Pnl (80076-HEPATIC) TLB-TSH (Thyroid Stimulating Hormone) (84443-TSH) EKG w/ Interpretation (93000)  Problem # 2:  ATRIAL FIBRILLATION (ICD-427.31)  as per Dr Eden Emms His updated medication list for this problem includes:    Metoprolol Tartrate 50 Mg Tabs (Metoprolol tartrate) .Marland Kitchen... 1/2 by mouth two times a day    Coumadin 5 Mg Tabs (Warfarin sodium) ..... Use as directed by anticoagulation clinic.  Orders: Venipuncture (60454) EKG w/ Interpretation (93000) Protime (09811BJ)  Problem # 3:  ASTHMA (ICD-493.90) Stable His updated medication list for this problem includes:    Advair Diskus 250-50 Mcg/dose Misc (Fluticasone-salmeterol) ..... Inhale 1 puff by mouth every 12 hours,    Proair Hfa 108 (90 Base) Mcg/act Aers (Albuterol sulfate) .Marland Kitchen... 1-2 puffs q 4-6 hrs prn  Problem # 4:  GOUT (ICD-274.9)  PMH of  Orders: TLB-Uric Acid, Blood (84550-URIC)  Problem # 5:  HYPERPLASIA PROSTATE UNS W/O UR OBST & OTH LUTS (ICD-600.90)  Orders: Venipuncture (47829) TLB-PSA (Prostate Specific Antigen) (84153-PSA)  Problem # 6:  SPECIAL SCREENING FOR MALIGNANT NEOPLASMS COLON  (ICD-V76.51)  Orders: Gastroenterology Referral (GI)  Problem # 7:  COUMADIN THERAPY (ICD-V58.61)  Orders: TLB-CBC Platelet - w/Differential (85025-CBCD) Protime (56213YQ)  Complete Medication List: 1)  Advair Diskus 250-50 Mcg/dose Misc (Fluticasone-salmeterol) .... Inhale 1 puff by mouth every 12 hours, 2)  Lisinopril 40 Mg Tabs (Lisinopril) .... 1/2 tab daily 3)  Metoprolol Tartrate 50 Mg Tabs (Metoprolol tartrate) .... 1/2 by mouth two times a day 4)  Lipitor 20 Mg Tabs (Atorvastatin calcium) .Marland Kitchen.. 1 by mouth mon,wed,frid, sun 5)  Asa 81mg   .... Qd 6)  Proair Hfa 108 (90 Base) Mcg/act Aers (Albuterol sulfate) .Marland Kitchen.. 1-2 puffs q 4-6 hrs prn 7)  Multivitamins Tabs (Multiple vitamin) .Marland Kitchen.. 1 by mouth once daily 8)  Coumadin 5 Mg Tabs (Warfarin sodium) .... Use as directed by anticoagulation clinic.  Other Orders: TLB-BMP (Basic Metabolic Panel-BMET) (80048-METABOL)  Hypertension Assessment/Plan:      The patient's hypertensive risk group is category B: At least one risk factor (excluding diabetes) with no target organ damage.  His calculated 10 year risk of coronary heart disease is 7 %.  Today's blood pressure is 130/84.    Patient Instructions: 1)  Correct data & carry copy when traveling. No change in warfarin; recheck 4 weeks. Prescriptions: METOPROLOL TARTRATE 50 MG  TABS (METOPROLOL TARTRATE) 1/2 by mouth two times a day  #90 x 3   Entered and Authorized by:   [name] MD   Signed by:   [name] MD on 01/23/2009   Method used:   Faxed to ...       [facility]* (retail)       [address] [place]  [place], [place]  [address]       Ph: [phone]       Fax: [phone]   RxID:   [id number] LIPITOR 20 MG  TABS (ATORVASTATIN CALCIUM) 1 by mouth mon,wed,frid, sun  #30 x 6   Entered and Authorized by:   [name] MD   Signed by:   [name] MD on 01/23/2009   Method used:   Faxed to ...       [facility] [address]* (retail)       [address] [place], [place]  [address]       Ph: [phone]       Fax: [phone]   RxID:   [id number] LISINOPRIL 40 MG  TABS (LISINOPRIL) 1/2 tab daily  #90 x 1   Entered and Authorized by:   [name] MD   Signed by:   [name] MD on 01/23/2009   Method used:   Faxed to ...       [facility] [address]* (retail)       [address] [place], [place]  [address]       Ph: [phone]       Fax: [phone]   RxID:   [id number] ADVAIR DISKUS 250-50 MCG/DOSE  MISC (FLUTICASONE-SALMETEROL) INHALE 1 PUFF by mouth EVERY 12 HOURS,  #1 x 11   Entered and Authorized by:   [name] MD   Signed by:   [name] MD on 01/23/2009   Method used:   Faxed to ...       [facility] [address] (retail)       [address] [place], [place]  [address]       Ph: [phone]       Fax: [phone]   RxID:   [id number]    ANTICOAGULATION RECORD PREVIOUS REGIMEN & LAB RESULTS Anticoagulation Diagnosis:  Atrial Fibrillation (ICD-427.31) on  05/16/2008 Previous INR Goal Range:  2 - 3 on  05/16/2008  Previous Coumadin Dose(mg):  5mg  on  11/01/2008   NEW REGIMEN & LAB RESULTS Current INR: 2.5 Regimen:   (no change)  Other Comments: Patient will follow-up in 4 weeks with Coumadin Clinic  MEDICATIONS ADVAIR DISKUS 250-50 MCG/DOSE  MISC (FLUTICASONE-SALMETEROL) INHALE 1 PUFF by mouth EVERY 12 HOURS, LISINOPRIL 40 MG  TABS (LISINOPRIL) 1/2 tab daily METOPROLOL TARTRATE 50 MG  TABS (METOPROLOL TARTRATE) 1/2 by mouth two times a day LIPITOR 20 MG  TABS (ATORVASTATIN CALCIUM) 1 by mouth mon,wed,frid, sun * ASA 81MG  qd PROAIR HFA 108 (90 BASE) MCG/ACT  AERS (ALBUTEROL SULFATE) 1-2 puffs q 4-6 hrs prn MULTIVITAMINS  TABS (MULTIPLE VITAMIN) 1 by mouth once daily COUMADIN 5 MG TABS (WARFARIN  SODIUM) Use as directed by anticoagulation clinic.   Anticoagulation Visit Questionnaire      Coumadin dose missed/changed:  No      Abnormal Bleeding Symptoms:  No   Any diet changes including alcohol intake, vegetables or greens since the last visit:  No Any illnesses or hospitalizations since the last visit:  No Any signs of clotting since the last visit (including chest discomfort, dizziness, shortness of breath, arm tingling, slurred speech, swelling or redness in leg):  No

## 2010-02-06 NOTE — Procedures (Signed)
Summary: Colonoscopy  Patient: Reginald Green Note: All result statuses are Final unless otherwise noted.  Tests: (1) Colonoscopy (COL)   COL Colonoscopy           DONE     [facility]     [address]     [place], [place]  [address]           COLONOSCOPY PROCEDURE REPORT           PATIENT:  Reginald Green, Reginald Green  MR#:  [id number]     BIRTHDATE:  23-Jan-1945, 63 yrs. old  GENDER:  male           ENDOSCOPIST:  Vania Rea. Jarold Motto, MD, [facility]     Referred by:           PROCEDURE DATE:  02/15/2009     PROCEDURE:  Average-risk screening colonoscopy     Q2595     ASA CLASS:  Class II     INDICATIONS:  Routine Risk Screening           MEDICATIONS:   Fentanyl 50 mcg IV, Versed 9 mg IV           DESCRIPTION OF PROCEDURE:   After the risks benefits and     alternatives of the procedure were thoroughly explained, informed     consent was obtained.  Digital rectal exam was performed and     revealed no abnormalities.   The LB [id number] endoscope     was introduced through the anus and advanced to the cecum, which     was identified by both the appendix and ileocecal valve, without     limitations.  The quality of the prep was excellent, using     MoviPrep.  The instrument was then slowly withdrawn as the colon     was fully examined.     <<PROCEDUREIMAGES>>           FINDINGS:  No polyps or cancers were seen.  This was otherwise a     normal examination of the colon.   Retroflexed views in the rectum     revealed no abnormalities.    The scope was then withdrawn from     the patient and the procedure completed.           COMPLICATIONS:  None           ENDOSCOPIC IMPRESSION:     1) No polyps or cancers     2) Otherwise normal examination     RECOMMENDATIONS:     1) Continue current colorectal screening recommendations for     "routine risk" patients with a repeat colonoscopy in 10 years.     RESUME ALL MEDS           REPEAT EXAM:  No        ______________________________     Vania Rea. Jarold Motto, MD, Clementeen Graham           CC:  Pecola Lawless, MD           n.     Rosalie DoctorMarland Kitchen   Vania Rea. Patterson at 02/15/2009 11:41 AM           Wolter, Jonny Ruiz, [phone]  Note: An exclamation mark (!) indicates a result that was not dispersed into the flowsheet. Document Creation Date: 02/15/2009 11:41 AM _______________________________________________________________________  (1) Order result status: Final Collection or observation date-time: 02/15/2009 11:36 Requested date-time:  Receipt date-time:  Reported date-time:  Referring Physician:  Ordering Physician: Sheryn Bison [phone]) Specimen Source:  Source: Launa Grill Order Number: [id number] Lab site:   Appended Document: Colonoscopy    Clinical Lists Changes  Observations: Added new observation of COLONNXTDUE: 02/2019 (02/15/2009 12:52)      Appended Document: Colonoscopy     Procedures Next Due Date:    Colonoscopy: 02/2019

## 2010-02-06 NOTE — Assessment & Plan Note (Signed)
Summary: SCREEN FOR COLON ONCOUMADIN/YF    History of Present Illness Visit Type: Initial Consult Primary GI MD: Sheryn Bison MD FACP FAGA Primary Provider: [name], MD Requesting Provider: [name], MD Chief Complaint: Colon screening, patient on coumadin History of Present Illness:   65 year old Caucasian male with chronic atrial  fibrillation on Coumadin and 81 mg of aspirin a day  referred for possible colonoscopy screening.  Croix had flexible sigmoidoscopy in 1999 that was unremarkable. He's had no followup since that time and remains asymptomatic in terms of any gastrointestinal complaints. He has regular daily bowel movements without melena, hematochezia, or abdominal pain. He denies upper gastrointestinal or hepatobiliary complaints. Appetite is good and his weight is stable without food intolerances.  His atrial fibrillation is well controlled with lisinopril and metaprolol. His prothrombin time runs 2 x control. Other problems have included surgery for undescended testicle, and inguinall hernia repair, multiple cardioversions by Dr.Nishan, and appendectomy. Patient denies any current cardiovascular or pulmonary complaints.He does have attacks of gouty arthritis and has recently been started on allopurinol therapy.   GI Review of Systems      Denies abdominal pain, acid reflux, belching, bloating, chest pain, dysphagia with liquids, dysphagia with solids, heartburn, loss of appetite, nausea, vomiting, vomiting blood, weight loss, and  weight gain.        Denies anal fissure, black tarry stools, change in bowel habit, constipation, diarrhea, diverticulosis, fecal incontinence, heme positive stool, hemorrhoids, irritable bowel syndrome, jaundice, light color stool, liver problems, rectal bleeding, and  rectal pain.    Current Medications (verified): 1)  Advair Diskus 250-50 Mcg/dose  Misc (Fluticasone-Salmeterol) .... Inhale 1 Puff By Mouth Every 12 Hours, 2)   Lisinopril 40 Mg  Tabs (Lisinopril) .... 1/2 Tab Daily 3)  Metoprolol Tartrate 50 Mg  Tabs (Metoprolol Tartrate) .... 1/2 By Mouth Two Times A Day 4)  Lipitor 20 Mg  Tabs (Atorvastatin Calcium) .Marland Kitchen.. 1 By Mouth Mon,wed,frid, Sun 5)  Asa 81mg  .... Qd 6)  Proair Hfa 108 (90 Base) Mcg/act  Aers (Albuterol Sulfate) .Marland Kitchen.. 1-2 Puffs Q 4-6 Hrs Prn 7)  Multivitamins  Tabs (Multiple Vitamin) .Marland Kitchen.. 1 By Mouth Once Daily 8)  Coumadin 5 Mg Tabs (Warfarin Sodium) .... Use As Directed By Anticoagulation Clinic. 9)  Allopurinol 100 Mg Tabs (Allopurinol) .Marland Kitchen.. 1 Once Daily. If This Is Started , Check Pt/inr After 1 Week  Allergies (verified): No Known Drug Allergies  Past History:  Past medical, surgical, family and social histories (including risk factors) reviewed for relevance to current acute and chronic problems.  Past Medical History: atrial fibrillation PVC and  excess hypertensive response at treadmill stress test 12/25/1998 Hypertension elevated uric acid, PMH of Gilberts syndrome false positive HIV 2004 @ [facility] Hyperlipidemia Tinnitus Gout COPD  Past Surgical History: hernia repair 1979 undescended testicle-testicle remans, it was brought down at herniorrhaphy 1979; incidental appendectomy done @ same surgery 1979 Tonsillectomy colonoscopy negative 2001, due 2011 dislocated left shoulder  S/P Physical Therapy 12/2005 Cardioversions , multiple, unsuccessful, Dr Eden Emms Appendectomy  Family History: Reviewed history from 01/23/2009 and no changes required. maternal uncle 2 vessel CABG, carotid endarectomy mother CVA ,endarterectomy, HTN maternal grandmotherCVA, diabetes maternal grandfather MI @ 82; M uncle MI <55,CBAG No FH of Colon Cancer: Family History of Colon Polyps:Mother Family History of Heart Disease: Father  Social History: Reviewed history from 01/23/2009 and no changes required. Former Smoker: quit age 89 Alcohol use-yes occasionally Regular  exercise-yes: walking 2-3 mpd > 3X/week Occupation: Project  Manager Daily Caffeine Use 2-3  Review of Systems  The patient denies allergy/sinus, anemia, anxiety-new, arthritis/joint pain, back pain, blood in urine, breast changes/lumps, change in vision, confusion, cough, coughing up blood, depression-new, fainting, fatigue, fever, headaches-new, hearing problems, heart murmur, heart rhythm changes, itching, menstrual pain, muscle pains/cramps, night sweats, nosebleeds, pregnancy symptoms, shortness of breath, skin rash, sleeping problems, sore throat, swelling of feet/legs, swollen lymph glands, thirst - excessive , urination - excessive , urination changes/pain, urine leakage, vision changes, and voice change.    Vital Signs:  Patient profile:   65 year old male Height:      74 inches Weight:      226.25 pounds BMI:     29.15 Pulse rate:   68 / minute Pulse rhythm:   irregular BP sitting:   110 / 70  (left arm) Cuff size:   regular  Vitals Entered By: June McMurray CMA Duncan Dull) (February 03, 2009 10:25 AM)  Physical Exam  General:  Well developed, well nourished, no acute distress.healthy appearing.   Head:  Normocephalic and atraumatic. Eyes:  PERRLA, no icterus.exam deferred to patient's ophthalmologist.   Lungs:  Clear throughout to auscultation. Heart:  Regular rate and rhythm; no murmurs, rubs,  or bruits. Abdomen:  Soft, nontender and nondistended. No masses, hepatosplenomegaly or hernias noted. Normal bowel sounds. Rectal:  deferred until time of colonoscopy.   Msk:  Symmetrical with no gross deformities. Normal posture. Pulses:  Normal pulses noted. Extremities:  No clubbing, cyanosis, edema or deformities noted. Neurologic:  Alert and  oriented x4;  grossly normal neurologically. Inguinal Nodes:  No significant inguinal adenopathy. Psych:  Alert and cooperative. Normal mood and affect.   Impression & Recommendations:  Problem # 1:  SPECIAL SCREENING FOR MALIGNANT  NEOPLASMS COLON (ICD-V76.51) Assessment Unchanged His mother had colonic polyposis but at an elderly age.The Patient is asymptomatic, but has never had full diagnostic colonoscopy which is scheduled at his convenience. We will hold Coumadin 5 days before this procedure but continue salicylate therapy. Risk and benefits of colonoscopy polypectomy including bleeding with anticoagulant use has been reviewed in detail, and the patient understands the risk involved should polypectomy be performed. I'll send this letter to cardiology and to Dr. Alwyn Ren to be sure they have no objections to these plans.  Problem # 2:  COUMADIN THERAPY (ICD-V58.61) Assessment: Unchanged Hold and resume Coumadin for procedure as per Coumadin clinic direction  Problem # 3:  ATRIAL FIBRILLATION (ICD-427.31) Assessment: Improved His cardiac rhythm is slightly irregular but the rate is well controlled on lisinopril/ metaprolol.He Is to Continue All His Medications as per Drs. Hopper and [employer].  Problem # 4:  UNSPECIFIED ESSENTIAL HYPERTENSION (ICD-401.9) Assessment: Improved blood pressure today is 110/70.  Patient Instructions: 1)  Copy sent to : Dr. [name] and Dr. Charlton Haws 2)  Please continue current medications.  3)  Coumadin adjustments for colonoscopy 4)  Colonoscopy and Flexible Sigmoidoscopy brochure given.  5)  Conscious Sedation brochure given.  6)  The medication list was reviewed and reconciled.  All changed / newly prescribed medications were explained.  A complete medication list was provided to the patient / caregiver.  Appended Document: SCREEN FOR COLON ONCOUMADIN/YF    Clinical Lists Changes  Medications: Added new medication of MOVIPREP 100 GM  SOLR (PEG-KCL-NACL-NASULF-NA ASC-C) As per prep instructions. - Signed Rx of MOVIPREP 100 GM  SOLR (PEG-KCL-NACL-NASULF-NA ASC-C) As per prep instructions.;  #1 x 0;  Signed;  Entered by: Ashok Cordia RN;  Authorized by: Mardella Layman MD  [facility];  Method used: Electronically to [facility] [address], [place], [place], [place]  [address], Ph: [phone], Fax: [phone] Orders: Added new Test order of Colonoscopy (Colon) - Signed    Prescriptions: MOVIPREP 100 GM  SOLR (PEG-KCL-NACL-NASULF-NA ASC-C) As per prep instructions.  #1 x 0   Entered by:   Ashok Cordia RN   Authorized by:   Mardella Layman MD [facility]   Signed by:   Ashok Cordia RN on 02/03/2009   Method used:   Electronically to        [facility]* (retail)       [address] [place], [place]  [address]       Ph: [phone]       Fax: [phone]   RxID:   [id number]

## 2010-02-06 NOTE — Letter (Signed)
Summary: New Patient letter  [facility]  [address] [place], [place] [address]   Phone: [phone]  Fax: [phone]       01/24/2009 MRN: [id number]  Reginald Green [address] [place], [place]  [address]  Dear Reginald Green,  Welcome to the Gastroenterology Division at [facility].    You are scheduled to see Dr.  Jarold Motto on 02-03-09 at 10AM on the 3rd floor at [facility], [address]  We ask that you try to arrive at our office 15 minutes prior to your appointment time to allow for check-in.  We would like you to complete the enclosed self-administered evaluation form prior to your visit and bring it with you on the day of your appointment.  We will review it with you.  Also, please bring a complete list of all your medications or, if you prefer, bring the medication bottles and we will list them.  Please bring your insurance card so that we may make a copy of it.  If your insurance requires a referral to see a specialist, please bring your referral form from your primary care physician.  Co-payments are due at the time of your visit and may be paid by cash, check or credit card.     Your office visit will consist of a consult with your physician (includes a physical exam), any laboratory testing he/she may order, scheduling of any necessary diagnostic testing (e.g. x-ray, ultrasound, CT-scan), and scheduling of a procedure (e.g. Endoscopy, Colonoscopy) if required.  Please allow enough time on your schedule to allow for any/all of these possibilities.    If you cannot keep your appointment, please call [phone] to cancel or reschedule prior to your appointment date.  This allows [place] the opportunity to schedule an appointment for another patient in need of care.  If you do not cancel or reschedule by 5 p.m. the business day prior to your appointment date, you will be charged a $50.00 late cancellation/no-show fee.    Thank you for choosing  [facility] for your medical needs.  We appreciate the opportunity to care for you.  Please visit [place] at our website  to learn more about our practice.                     Sincerely,                                                             The Gastroenterology Division

## 2010-02-06 NOTE — Medication Information (Signed)
Summary: rov/sp  Anticoagulant Therapy  Managed by: [name], PharmD Referring MD: [name] PCP: [name], MD Supervising MD: Eden Emms MD, Theron Arista Indication 1: Atrial Fibrillation (ICD-427.31) Lab Used: [facility] Site: [facility] INR POC 2.4 INR RANGE 2 - 3  Dietary changes: no    Health status changes: no    Bleeding/hemorrhagic complications: no    Recent/future hospitalizations: no    Any changes in medication regimen? no    Recent/future dental: no  Any missed doses?: no       Is patient compliant with meds? yes       Allergies: No Known Drug Allergies  Anticoagulation Management History:      The patient is taking warfarin and comes in today for a routine follow up visit.  Negative risk factors for bleeding include an age less than 47 years old and no history of CVA/TIA.  The bleeding index is 'low risk'.  Positive CHADS2 values include History of HTN.  Negative CHADS2 values include Age > 4 years old, History of Diabetes, and Prior Stroke/CVA/TIA.  The start date was 02/12/2007.  His last INR was 2.5.  Anticoagulation responsible provider: Eden Emms MD, Theron Arista.  INR POC: 2.4.  Cuvette Lot#: [id number].  Exp: 08/2010.    Anticoagulation Management Assessment/Plan:      The patient's current anticoagulation dose is Coumadin 5 mg tabs: Use as directed by anticoagulation clinic..  The target INR is 2 - 3.  The next INR is due 06/16/2009.  Anticoagulation instructions were given to patient.  Results were reviewed/authorized by [name], PharmD.  He was notified by [name] PharmD.         Prior Anticoagulation Instructions: INR 1.9  Take 1 1/2 tablets today then resume same dose of 1 tablet every day except 1 1/2 tablets on Saturday   Current Anticoagulation Instructions: INR 2.4  Continue same dose of 1 tablet every day except 1 1/2 tablets on Saturday.  Recheck in 4 weeks.

## 2010-02-06 NOTE — Medication Information (Signed)
Summary: rov/cs   Anticoagulant Therapy  Managed by: [name], PharmD Referring MD: [name] PCP: [name], MD Supervising MD: Patty Sermons Indication 1: Atrial Fibrillation (ICD-427.31) Lab Used: [facility] Site: [facility] INR POC 2.5 INR RANGE 2 - 3  Dietary changes: no    Health status changes: no    Bleeding/hemorrhagic complications: no    Recent/future hospitalizations: no    Any changes in medication regimen? no    Recent/future dental: no  Any missed doses?: no       Is patient compliant with meds? yes       Allergies: No Known Drug Allergies  Anticoagulation Management History:      The patient is taking warfarin and comes in today for a routine follow up visit.  Negative risk factors for bleeding include an age less than 16 years old and no history of CVA/TIA.  The bleeding index is 'low risk'.  Positive CHADS2 values include History of HTN.  Negative CHADS2 values include Age > 62 years old, History of Diabetes, and Prior Stroke/CVA/TIA.  The start date was 02/12/2007.  His last INR was 2.5.  Anticoagulation responsible Reshawn Ostlund: [place].  INR POC: 2.5.  Cuvette Lot#: [id number].  Exp: 12/2010.    Anticoagulation Management Assessment/Plan:      The patient's current anticoagulation dose is Coumadin 5 mg tabs: Use as directed by anticoagulation clinic..  The target INR is 2 - 3.  The next INR is due 12/07/2009.  Anticoagulation instructions were given to patient.  Results were reviewed/authorized by [name], PharmD.  He was notified by Hoy Register, PharmD Candidate.         Prior Anticoagulation Instructions: INR 2.6  Continue Coumadin as scheduled - 1 tablet every day of the week except 1.5 tab on Saturday.  Recheck in 4 weeks.   Current Anticoagulation Instructions: INR 2.5  Continue previous dose of 1 tablet daily except 1.5 tablets on Saturday.  Recheck INR in 4 weeks.

## 2010-02-06 NOTE — Medication Information (Signed)
Summary: ccr/ appt is 4 p.m. same day with pn/ gd   Anticoagulant Therapy  Managed by: Eda Keys, PharmD Referring MD: [name] PCP: [name], MD Supervising MD: Ladona Ridgel MD, Sharlot Gowda Indication 1: Atrial Fibrillation (ICD-427.31) Lab Used: [facility] Site: [facility] INR POC 2.2 INR RANGE 2 - 3  Dietary changes: no    Health status changes: no    Bleeding/hemorrhagic complications: no    Recent/future hospitalizations: no    Any changes in medication regimen? no    Recent/future dental: no  Any missed doses?: no       Is patient compliant with meds? yes       Allergies: No Known Drug Allergies  Anticoagulation Management History:      The patient is taking warfarin and comes in today for a routine follow up visit.  Negative risk factors for bleeding include an age less than 46 years old and no history of CVA/TIA.  The bleeding index is 'low risk'.  Positive CHADS2 values include History of HTN.  Negative CHADS2 values include Age > 69 years old, History of Diabetes, and Prior Stroke/CVA/TIA.  The start date was 02/12/2007.  His last INR was 2.5.  Anticoagulation responsible provider: Ladona Ridgel MD, Sharlot Gowda.  INR POC: 2.2.  Cuvette Lot#: [id number].  Exp: 11/2010.    Anticoagulation Management Assessment/Plan:      The patient's current anticoagulation dose is Coumadin 5 mg tabs: Use as directed by anticoagulation clinic..  The target INR is 2 - 3.  The next INR is due 10/11/2009.  Anticoagulation instructions were given to patient.  Results were reviewed/authorized by Eda Keys, PharmD.  He was notified by Harrel Carina, PharmD candidate.         Prior Anticoagulation Instructions: INR- 2.0  Continue taking 1 tablet (5mg ) daily except take 1.5 tablets (7.5mg ) on Sat.    Current Anticoagulation Instructions: INR 2.2  Continue taking 1 tablet every day except take 1 1/2 tablets on Saturdays. Re-check INR in 4 weeks.

## 2010-02-06 NOTE — Medication Information (Signed)
Summary: rov/eac  Anticoagulant Therapy  Managed by: [name], PharmD Referring MD: [name] PCP: [name], MD Supervising MD: Tenny Craw MD, Gunnar Fusi Indication 1: Atrial Fibrillation (ICD-427.31) Lab Used: [facility] Site: [facility] INR POC 1.9 INR RANGE 2 - 3  Dietary changes: no    Health status changes: no    Bleeding/hemorrhagic complications: no    Recent/future hospitalizations: no    Any changes in medication regimen? no    Recent/future dental: no  Any missed doses?: no       Is patient compliant with meds? yes       Allergies: No Known Drug Allergies  Anticoagulation Management History:      The patient is taking warfarin and comes in today for a routine follow up visit.  Negative risk factors for bleeding include an age less than 29 years old and no history of CVA/TIA.  The bleeding index is 'low risk'.  Positive CHADS2 values include History of HTN.  Negative CHADS2 values include Age > 48 years old, History of Diabetes, and Prior Stroke/CVA/TIA.  The start date was 02/12/2007.  His last INR was 2.5.  Anticoagulation responsible provider: Tenny Craw MD, Gunnar Fusi.  INR POC: 1.9.  Cuvette Lot#: [id number].  Exp: 05/2010.    Anticoagulation Management Assessment/Plan:      The patient's current anticoagulation dose is Coumadin 5 mg tabs: Use as directed by anticoagulation clinic..  The target INR is 2 - 3.  The next INR is due 05/19/2009.  Anticoagulation instructions were given to patient.  Results were reviewed/authorized by [name], PharmD.  He was notified by [name] PharmD.         Prior Anticoagulation Instructions: INR 2.1  Continue taking 1.5 tablets on Saturday and 1 tablet all other days.  Return to clinic in 4 weeks.  Current Anticoagulation Instructions: INR 1.9  Take 1 1/2 tablets today then resume same dose of 1 tablet every day except 1 1/2 tablets on Saturday

## 2010-02-06 NOTE — Medication Information (Signed)
Summary: rov/jaj      Allergies Added: NKDA Anticoagulant Therapy  Managed by: [name], PharmD Referring MD: [name] PCP: [name], MD Supervising MD: Leodis Sias Indication 1: Atrial Fibrillation (ICD-427.31) Lab Used: [facility] Site: [facility] INR POC 2.6 INR RANGE 2 - 3  Dietary changes: no    Health status changes: no    Bleeding/hemorrhagic complications: no    Recent/future hospitalizations: no    Any changes in medication regimen? no    Recent/future dental: no  Any missed doses?: no       Is patient compliant with meds? yes       Current Medications (verified): 1)  Advair Diskus 250-50 Mcg/dose  Misc (Fluticasone-Salmeterol) .... Inhale 1 Puff By Mouth Every 12 Hours, 2)  Lisinopril 40 Mg  Tabs (Lisinopril) .... 1/2 Tab Daily 3)  Metoprolol Tartrate 50 Mg  Tabs (Metoprolol Tartrate) .... 1/2 By Mouth Two Times A Day 4)  Lipitor 20 Mg  Tabs (Atorvastatin Calcium) .Marland Kitchen.. 1 Tablet Mon ,wed, Fri, Sunday 5)  Aspirin 81 Mg  Tabs (Aspirin) .Marland Kitchen.. 1 Tab By Mouth Once Daily 6)  Proair Hfa 108 (90 Base) Mcg/act  Aers (Albuterol Sulfate) .Marland Kitchen.. 1-2 Puffs Q 4-6 Hrs Prn 7)  Multivitamins  Tabs (Multiple Vitamin) .Marland Kitchen.. 1 By Mouth Once Daily 8)  Coumadin 5 Mg Tabs (Warfarin Sodium) .... Use As Directed By Anticoagulation Clinic.  Allergies (verified): No Known Drug Allergies  Anticoagulation Management History:      The patient is taking warfarin and comes in today for a routine follow up visit.  Negative risk factors for bleeding include an age less than 72 years old and no history of CVA/TIA.  The bleeding index is 'low risk'.  Positive CHADS2 values include History of HTN.  Negative CHADS2 values include Age > 26 years old, History of Diabetes, and Prior Stroke/CVA/TIA.  The start date was 02/12/2007.  His last INR was 2.5.  Anticoagulation responsible Reginald Green: Leodis Sias.  INR POC: 2.6.  Cuvette Lot#: [id number].  Exp: 11/2010.    Anticoagulation  Management Assessment/Plan:      The patient's current anticoagulation dose is Coumadin 5 mg tabs: Use as directed by anticoagulation clinic..  The target INR is 2 - 3.  The next INR is due 11/08/2009.  Anticoagulation instructions were given to patient.  Results were reviewed/authorized by [name], PharmD.  He was notified by Haynes Hoehn, PharmD candidate.         Prior Anticoagulation Instructions: INR 2.2  Continue taking 1 tablet every day except take 1 1/2 tablets on Saturdays. Re-check INR in 4 weeks.   Current Anticoagulation Instructions: INR 2.6  Continue Coumadin as scheduled - 1 tablet every day of the week except 1.5 tab on Saturday.  Recheck in 4 weeks.

## 2010-02-06 NOTE — Progress Notes (Signed)
Summary: lipitor refill   Phone Note Refill Request Message from:  Patient on March 14, 2009 3:42 PM  Refills Requested: Medication #1:  LIPITOR 20 MG  TABS 1 by mouth [facility]-francis king  Initial call taken by: Doristine Devoid,  March 14, 2009 3:42 PM  Follow-up for Phone Call        patient need lipitor prescription rewritten so he could get $4 co-pay.Marland KitchenMarland KitchenDoristine Devoid  March 14, 2009 3:44 PM     New/Updated Medications: LIPITOR 20 MG  TABS (ATORVASTATIN CALCIUM) 1 tablet at bedtime Prescriptions: LIPITOR 20 MG  TABS (ATORVASTATIN CALCIUM) 1 tablet at bedtime  #30 x 5   Entered by:   Doristine Devoid   Authorized by:   [name] MD   Signed by:   Doristine Devoid on 03/14/2009   Method used:   Electronically to        [facility]* (retail)       [address] [place], [place]  [address]       Ph: [phone]       Fax: [phone]   RxID:   [id number]

## 2010-02-06 NOTE — Medication Information (Signed)
Summary: CCR/PER PT PH CALL/JSS  Anticoagulant Therapy  Managed by: Cloyde Reams, RN, BSN Referring MD: [name] PCP: [name], MD Supervising MD: Jens Som MD, Arlys Camren Indication 1: Atrial Fibrillation (ICD-427.31) Lab Used: [facility] Site: [facility] INR POC 2.0 INR RANGE 2 - 3  Dietary changes: no    Health status changes: no    Bleeding/hemorrhagic complications: no    Recent/future hospitalizations: no    Any changes in medication regimen? no    Recent/future dental: no  Any missed doses?: yes     Details: Off coumadin 2/4-2/9  Is patient compliant with meds? yes       Allergies (verified): No Known Drug Allergies  Anticoagulation Management History:      The patient is taking warfarin and comes in today for a routine follow up visit.  Negative risk factors for bleeding include an age less than 68 years old and no history of CVA/TIA.  The bleeding index is 'low risk'.  Positive CHADS2 values include History of HTN.  Negative CHADS2 values include Age > 23 years old, History of Diabetes, and Prior Stroke/CVA/TIA.  The start date was 02/12/2007.  His last INR was 2.5.  Anticoagulation responsible provider: Jens Som MD, Arlys Josia.  INR POC: 2.0.  Cuvette Lot#: [id number].  Exp: 04/2010.    Anticoagulation Management Assessment/Plan:      The patient's current anticoagulation dose is Coumadin 5 mg tabs: Use as directed by anticoagulation clinic..  The target INR is 2 - 3.  The next INR is due 03/24/2009.  Anticoagulation instructions were given to patient.  Results were reviewed/authorized by Cloyde Reams, RN, BSN.  He was notified by Cloyde Reams RN.         Prior Anticoagulation Instructions: INR 2.7  Continue 5mg s daily except 7.5mg s on Sat. Recheck in 4 wks.   Current Anticoagulation Instructions: INR 2.0  Continue on same dosage 1 tablet daily except 1.5 tablets on Saturdays.   Recheck in 4 weeks.

## 2010-02-06 NOTE — Medication Information (Signed)
Summary: rov/sp  Anticoagulant Therapy  Managed by: Elaina Pattee, PharmD Referring MD: [name] PCP: [name], MD Supervising MD: Myrtis Ser MD, Tinnie Gens Indication 1: Atrial Fibrillation (ICD-427.31) Lab Used: [facility] Site: [facility] INR POC 2.6 INR RANGE 2 - 3  Dietary changes: no    Health status changes: no    Bleeding/hemorrhagic complications: no    Recent/future hospitalizations: no    Any changes in medication regimen? no    Recent/future dental: no  Any missed doses?: no       Is patient compliant with meds? yes       Allergies: No Known Drug Allergies  Anticoagulation Management History:      The patient is taking warfarin and comes in today for a routine follow up visit.  Negative risk factors for bleeding include an age less than 76 years old and no history of CVA/TIA.  The bleeding index is 'low risk'.  Positive CHADS2 values include History of HTN.  Negative CHADS2 values include Age > 8 years old, History of Diabetes, and Prior Stroke/CVA/TIA.  The start date was 02/12/2007.  His last INR was 2.5.  Anticoagulation responsible provider: Myrtis Ser MD, Tinnie Gens.  INR POC: 2.6.  Cuvette Lot#: [id number].  Exp: 08/2010.    Anticoagulation Management Assessment/Plan:      The patient's current anticoagulation dose is Coumadin 5 mg tabs: Use as directed by anticoagulation clinic..  The target INR is 2 - 3.  The next INR is due 07/14/2009.  Anticoagulation instructions were given to patient.  Results were reviewed/authorized by Elaina Pattee, PharmD.  He was notified by Elaina Pattee, PharmD.         Prior Anticoagulation Instructions: INR 2.4  Continue same dose of 1 tablet every day except 1 1/2 tablets on Saturday.  Recheck in 4 weeks.   Current Anticoagulation Instructions: INR 2.6. Take 1 tablet daily except 1.5 tablets on Sat.  Recheck in 4 weeks.

## 2010-02-06 NOTE — Medication Information (Signed)
Summary: rov/ewj  Anticoagulant Therapy  Managed by: Eda Keys, PharmD Referring MD: [name] PCP: [name], MD Supervising MD: Jens Som MD, Arlys Atha Indication 1: Atrial Fibrillation (ICD-427.31) Lab Used: [facility] Site: [facility] INR POC 2.1 INR RANGE 2 - 3  Dietary changes: no    Health status changes: no    Bleeding/hemorrhagic complications: no    Recent/future hospitalizations: no    Any changes in medication regimen? no    Recent/future dental: no  Any missed doses?: no       Is patient compliant with meds? yes       Allergies: No Known Drug Allergies  Anticoagulation Management History:      The patient is taking warfarin and comes in today for a routine follow up visit.  Negative risk factors for bleeding include an age less than 76 years old and no history of CVA/TIA.  The bleeding index is 'low risk'.  Positive CHADS2 values include History of HTN.  Negative CHADS2 values include Age > 72 years old, History of Diabetes, and Prior Stroke/CVA/TIA.  The start date was 02/12/2007.  His last INR was 2.5.  Anticoagulation responsible provider: Jens Som MD, Arlys Zyrus.  INR POC: 2.1.  Cuvette Lot#: [id number].  Exp: 05/2010.    Anticoagulation Management Assessment/Plan:      The patient's current anticoagulation dose is Coumadin 5 mg tabs: Use as directed by anticoagulation clinic..  The target INR is 2 - 3.  The next INR is due 04/21/2009.  Anticoagulation instructions were given to patient.  Results were reviewed/authorized by Eda Keys, PharmD.  He was notified by Eda Keys.         Prior Anticoagulation Instructions: INR 2.0  Continue on same dosage 1 tablet daily except 1.5 tablets on Saturdays.   Recheck in 4 weeks.    Current Anticoagulation Instructions: INR 2.1  Continue taking 1.5 tablets on Saturday and 1 tablet all other days.  Return to clinic in 4 weeks.

## 2010-02-08 NOTE — Medication Information (Signed)
Summary: rov/sp   Anticoagulant Therapy  Managed by: [name], PharmD Referring MD: [name] PCP: [name], MD Supervising MD: Tenny Craw MD, Gunnar Fusi Indication 1: Atrial Fibrillation (ICD-427.31) Lab Used: [facility] Site: [facility] INR POC 2.6 INR RANGE 2 - 3  Dietary changes: no    Health status changes: yes       Details: Case of vertigo probably due to congestion   Bleeding/hemorrhagic complications: no    Recent/future hospitalizations: no    Any changes in medication regimen? no    Recent/future dental: no  Any missed doses?: no       Is patient compliant with meds? yes       Allergies: No Known Drug Allergies  Anticoagulation Management History:      The patient is taking warfarin and comes in today for a routine follow up visit.  Negative risk factors for bleeding include an age less than 80 years old and no history of CVA/TIA.  The bleeding index is 'low risk'.  Positive CHADS2 values include History of HTN.  Negative CHADS2 values include Age > 24 years old, History of Diabetes, and Prior Stroke/CVA/TIA.  The start date was 02/12/2007.  His last INR was 2.5.  Anticoagulation responsible provider: Tenny Craw MD, Gunnar Fusi.  INR POC: 2.6.  Cuvette Lot#: [id number].  Exp: 01/2011.    Anticoagulation Management Assessment/Plan:      The patient's current anticoagulation dose is Coumadin 5 mg tabs: Use as directed by anticoagulation clinic..  The target INR is 2 - 3.  The next INR is due 02/08/2010.  Anticoagulation instructions were given to patient.  Results were reviewed/authorized by [name], PharmD.  He was notified by Stephannie Peters, PharmD Candidate .         Prior Anticoagulation Instructions: INR 2.5  Continue same dose of 1 tablet every day except 1 1/2 tablets on Saturday.  Recheck INR in 4 weeks.   Current Anticoagulation Instructions: INR 2.6  Coumadin 5 mg tablets - Continue 1 tablet daily except 1.5 tablets on Saturdays.

## 2010-02-08 NOTE — Assessment & Plan Note (Signed)
Summary: FOR MED REFILL//PH   Vital Signs:  Patient profile:   65 year old male Height:      74.75 inches Weight:      223.0 pounds BMI:     28.16 Temp:     98.3 degrees F oral Pulse rate:   76 / minute Resp:     14 per minute BP sitting:   116 / 78  (left arm) Cuff size:   large  Vitals Entered By: Shonna Chock CMA (January 26, 2010 4:31 PM) CC: Yearly follow-up and renew meds , URI symptoms, Lipid Management   Primary Care Provider:  [name], MD  CC:  Yearly follow-up and renew meds , URI symptoms, and Lipid Management.  History of Present Illness:    Reginald Green is here for a physical; he is asymptomatic except for head  congestion & PNDrainage.He  denies purulent nasal discharge, sore throat, productive cough, itchy eyes , sneezing , earache, fever, dyspnea,  wheezing & frontal  headache.  The patient denies the following risk factors for Strep sinusitis: unilateral facial pain, tooth pain, and tender adenopathy.      Hypertension Follow-Up:  The patient reports urinary frequency, but denies lightheadedness, edema, and fatigue.  The patient denies the following associated symptoms: chest pain, chest pressure, exercise intolerance, dyspnea, palpitations, and syncope.  Compliance with medications (by patient report) has been near 100% & dietary compliance has been good.  The patient reports exercising 3-4X per week.  Adjunctive measures currently used by the patient include salt restriction. BP  AVERAGES < 120/80.    Hyperlipidemia Follow-Up: The patient denies muscle aches, GI upset, abdominal pain, flushing, constipation, and diarrhea.  Compliance with medications (by patient report) has been near 100%.  Adjunctive measures currently used by the patient include fiber and ASA.    Lipid Management History:      Positive NCEP/ATP III risk factors include male age 52 years old or older, family history for ischemic heart disease (males less than 79 years old), and hypertension.   Negative NCEP/ATP III risk factors include non-diabetic, HDL cholesterol greater than 60, non-tobacco-user status, no ASHD (atherosclerotic heart disease), no prior stroke/TIA, no peripheral vascular disease, and no history of aortic aneurysm.     Current Medications (verified): 1)  Advair Diskus 250-50 Mcg/dose  Misc (Fluticasone-Salmeterol) .... Inhale 1 Puff By Mouth Every 12 Hours, 2)  Lisinopril 40 Mg  Tabs (Lisinopril) .... 1/2 Tab Daily 3)  Metoprolol Tartrate 50 Mg  Tabs (Metoprolol Tartrate) .... 1/2 By Mouth Two Times A Day 4)  Lipitor 20 Mg  Tabs (Atorvastatin Calcium) .Marland Kitchen.. 1 Tablet Mon ,wed, Fri, Sunday 5)  Aspirin 81 Mg  Tabs (Aspirin) .Marland Kitchen.. 1 Tab By Mouth Once Daily 6)  Proair Hfa 108 (90 Base) Mcg/act  Aers (Albuterol Sulfate) .Marland Kitchen.. 1-2 Puffs Q 4-6 Hrs Prn 7)  Multivitamins  Tabs (Multiple Vitamin) .Marland Kitchen.. 1 By Mouth Once Daily 8)  Coumadin 5 Mg Tabs (Warfarin Sodium) .... Use As Directed By Anticoagulation Clinic.  Allergies (verified): No Known Drug Allergies  Past History:  Past Medical History: Atrial fibrillation, Dr Eden Emms PVC and  excess hypertensive response at treadmill stress test 12/25/1998 Hypertension Gilberts syndrome false positive HIV 2004 @ [facility]/EIB Hyperlipidemia: Framingham Study LDL goal = < 130. Tinnitus Gout, PMH of  Past Surgical History: Hernia repair 1979 undescended testicle  brought down at herniorrhaphy 1979; incidental appendectomy done @ same surgery 1979 Tonsillectomy colonoscopy negative 2001& 2011 dislocated left shoulder  S/P Physical  Therapy 12/2005 Cardioversions, unsuccessful X 2, Dr Eden Emms Appendectomy  Family History: maternal uncle :multiple vessel  CABG X 2, carotid endarectomy mother: CVA ,endarterectomy, HTN, polyps maternal grandmother:CVA, diabetes maternal grandfather: MI @ 43; M uncle: MI <55,CBAG No FH of Colon Cancer   Father:CAD, MI @ 106  Social History: Former Smoker: quit age 30 Alcohol use-yes  occasionally Regular exercise-yes: walking 2-3 mpd  3-4 X/week Occupation: Emergency planning/management officer Daily Caffeine Use 2 cups / day & 2 glasses tea/ day   Review of Systems  The patient denies anorexia, weight loss, weight gain, hoarseness, hemoptysis, melena, hematochezia, severe indigestion/heartburn, hematuria, suspicious skin lesions, depression, unusual weight change, and angioedema.    Physical Exam  General:  well-nourished;alert,appropriate and cooperative throughout examination Head:  Normocephalic and atraumatic without obvious abnormalities. No apparent alopecia ; beard & moustache Eyes:  No corneal or conjunctival inflammation noted. EOMI. Perrla. Funduscopic exam benign, without hemorrhages, exudates or papilledema. Vision grossly normal. Ears:  External ear exam shows no significant lesions or deformities.  Otoscopic examination reveals clear canals, tympanic membranes are intact bilaterally without bulging, retraction, inflammation or discharge. Hearing is grossly normal bilaterally. Nose:  External nasal examination shows no deformity or inflammation. Nasal mucosa are pink and moist without lesions or exudates. Mouth:  Oral mucosa and oropharynx without lesions or exudates.  Teeth in good repair. Neck:  No deformities, masses, or tenderness noted. Lungs:  Normal respiratory effort, chest expands symmetrically. Lungs are clear to auscultation, no crackles or wheezes. Heart:  no murmur, no gallop, no rub, no JVD, no HJR, bradycardia, and irregular rhythm.   Abdomen:  Bowel sounds positive,abdomen soft and non-tender without masses, organomegaly or hernias noted. Rectal:  No external abnormalities noted. Normal sphincter tone. No rectal masses or tenderness. Genitalia:  Testes bilaterally descended without nodularity, tenderness or masses. No scrotal masses or lesions. No penis lesions or urethral discharge. R testicle high . Small L varicocele.   Prostate:  Prostate gland firm and smooth,  no enlargement, nodularity, tenderness, mass, asymmetry or induration. Msk:  No deformity or scoliosis noted of thoracic or lumbar spine.   Pulses:  R and L carotid,radial,dorsalis pedis and posterior tibial pulses are full and equal bilaterally Extremities:  No clubbing, cyanosis, edema, or deformity noted with normal full range of motion of all joints.   Crepitus of knees Neurologic:  alert & oriented X3 and DTRs symmetrical and normal.   Skin:  Intact without suspicious lesions or rashes Cervical Nodes:  No lymphadenopathy noted Axillary Nodes:  No palpable lymphadenopathy Inguinal Nodes:  No significant adenopathy Psych:  memory intact for recent and remote, normally interactive, and good eye contact.     Impression & Recommendations:  Problem # 1:  ROUTINE GENERAL MEDICAL EXAM@HEALTH  CARE FACL (ICD-V70.0)  Problem # 2:  RHINITIS (ICD-477.9)  His updated medication list for this problem includes:    Fluticasone Propionate 50 Mcg/act Susp (Fluticasone propionate) .Marland Kitchen... 1 spray two times a day as needed  Problem # 3:  HYPERLIPIDEMIA (ICD-272.4)  His updated medication list for this problem includes:    Lipitor 20 Mg Tabs (Atorvastatin calcium) .Marland Kitchen... 1 tablet mon ,wed, fri, sunday  Problem # 4:  ATRIAL FIBRILLATION (ICD-427.31) rate controlled; PT/INRs stable & therapeutic His updated medication list for this problem includes:    Metoprolol Tartrate 50 Mg Tabs (Metoprolol tartrate) .Marland Kitchen... 1/2 by mouth two times a day    Aspirin 81 Mg Tabs (Aspirin) .Marland Kitchen... 1 tab by mouth once daily    Coumadin  5 Mg Tabs (Warfarin sodium) ..... Use as directed by anticoagulation clinic.  Problem # 5:  ASTHMA (ICD-493.90) Quiescent His updated medication list for this problem includes:    Advair Diskus 250-50 Mcg/dose Misc (Fluticasone-salmeterol) ..... Inhale 1 puff by mouth every 12 hours,    Proair Hfa 108 (90 Base) Mcg/act Aers (Albuterol sulfate) .Marland Kitchen... 1-2 puffs q 4-6 hrs prn  Problem # 6:   GOUT (ICD-274.9) PMH of  Complete Medication List: 1)  Advair Diskus 250-50 Mcg/dose Misc (Fluticasone-salmeterol) .... Inhale 1 puff by mouth every 12 hours, 2)  Lisinopril 40 Mg Tabs (Lisinopril) .... 1/2 tab daily 3)  Metoprolol Tartrate 50 Mg Tabs (Metoprolol tartrate) .... 1/2 by mouth two times a day 4)  Lipitor 20 Mg Tabs (Atorvastatin calcium) .Marland Kitchen.. 1 tablet mon ,wed, fri, sunday 5)  Aspirin 81 Mg Tabs (Aspirin) .Marland Kitchen.. 1 tab by mouth once daily 6)  Proair Hfa 108 (90 Base) Mcg/act Aers (Albuterol sulfate) .Marland Kitchen.. 1-2 puffs q 4-6 hrs prn 7)  Multivitamins Tabs (Multiple vitamin) .Marland Kitchen.. 1 by mouth once daily 8)  Coumadin 5 Mg Tabs (Warfarin sodium) .... Use as directed by anticoagulation clinic. 9)  Fluticasone Propionate 50 Mcg/act Susp (Fluticasone propionate) .Marland Kitchen.. 1 spray two times a day as needed  Other Orders: Tdap => 39yrs IM (21308) Admin 1st Vaccine (65784)  Lipid Assessment/Plan:      Based on NCEP/ATP III, the patient's risk factor category is "0-1 risk factors".  The patient's lipid goals are as follows: Total cholesterol goal is 200; LDL cholesterol goal is 120; HDL cholesterol goal is 40; Triglyceride goal is 150.  His LDL cholesterol goal has been met.    Patient Instructions: 1)   For rhinitis use Neti pot & inhaled steroid  daily as needed.                Please schedule fasting labs; see Diagnoses for Codes:             uric acid ; 2)  BMP ; 3)  Hepatic Panel ; 4)  Lipid Panel ; 5)  TSH ; 6)  CBC w/ Diff ; 7)  PSA . 8)  It is important that you exercise regularly at least 20 minutes 5 times a week. If you develop chest pain, have severe difficulty breathing, or feel very tired , stop exercising immediately and seek medical attention. 9)  Check your Blood Pressure regularly. If it is above: 135/85 on average  you should make an appointment. Prescriptions: FLUTICASONE PROPIONATE 50 MCG/ACT SUSP (FLUTICASONE PROPIONATE) 1 spray two times a day as needed  #1 x 11   Entered  and Authorized by:   [name] MD   Signed by:   [name] MD on 01/26/2010   Method used:   Print then Give to Patient   RxID:   [id number] PROAIR HFA 108 (90 BASE) MCG/ACT  AERS (ALBUTEROL SULFATE) 1-2 puffs q 4-6 hrs prn  #1 x 2   Entered and Authorized by:   [name] MD   Signed by:   [name] MD on 01/26/2010   Method used:   Print then Give to Patient   RxID:   [id number] METOPROLOL TARTRATE 50 MG  TABS (METOPROLOL TARTRATE) 1/2 by mouth two times a day  #90 x 3   Entered and Authorized by:   [name] MD   Signed by:   [name] MD on 01/26/2010   Method used:   Print then Give to Patient  RxID:   [id number] LISINOPRIL 40 MG  TABS (LISINOPRIL) 1/2 tab daily  #90 x 3   Entered and Authorized by:   [name] MD   Signed by:   [name] MD on 01/26/2010   Method used:   Print then Give to Patient   RxID:   [id number] ADVAIR DISKUS 250-50 MCG/DOSE  MISC (FLUTICASONE-SALMETEROL) INHALE 1 PUFF by mouth EVERY 12 HOURS,  #1 x 11   Entered and Authorized by:   [name] MD   Signed by:   [name] MD on 01/26/2010   Method used:   Print then Give to Patient   RxID:   [id number]    Orders Added: 1)  Tdap => 57yrs IM [90715] 2)  Admin 1st Vaccine [90471] 3)  Est. Patient 40-64 years [99396]   Immunizations Administered:  Tetanus Vaccine:    Vaccine Type: Tdap    Site: right deltoid    Mfr: GlaxoSmithKline    Dose: 0.5 ml    Route: IM    Given by: Shonna Chock CMA    Exp. Date: 10/27/2011    Lot #: [id number]    VIS given: 11/25/07 version given January 26, 2010.   Immunizations Administered:  Tetanus Vaccine:    Vaccine Type: Tdap    Site: right deltoid    Mfr: GlaxoSmithKline    Dose: 0.5 ml    Route: IM    Given by: Shonna Chock CMA    Exp. Date: 10/27/2011    Lot #: [id number]    VIS given: 11/25/07 version given January 26, 2010.

## 2010-02-14 NOTE — Medication Information (Signed)
Summary: ROV/SP  Medications Added INR 2.7  Continue same dose of 1 tablet every day except 1 1/2 tablets on Saturday.  Recheck INR in 4 weeks. ADVAIR DISKUS 250-50 MCG/DOSE  MISC (FLUTICASONE-SALMETEROL) INHALE 1 PUFF by mouth EVERY 12 HOURS, MUST HAVE OV FOR ADDITIONAL REFILL ADVAIR DISKUS 250-50 MCG/DOSE  MISC (FLUTICASONE-SALMETEROL) INHALE 1 PUFF by mouth EVERY 12 HOURS, LISINOPRIL 40 MG  TABS (LISINOPRIL) 1/2 tab qd LISINOPRIL 40 MG  TABS (LISINOPRIL) 1/2 tab daily METOPROLOL TARTRATE 50 MG  TABS (METOPROLOL TARTRATE) 1/2 by mouth two times a day METOPROLOL TARTRATE 50 MG  TABS (METOPROLOL TARTRATE) 1/2 tab qd METOPROLOL TARTRATE 50 MG  TABS (METOPROLOL TARTRATE) 1/2 by mouth two times a day*OFFICE VISIT DUE NOW** METOPROLOL TARTRATE 50 MG  TABS (METOPROLOL TARTRATE) 1/2 by mouth two times a day LIPITOR 20 MG  TABS (ATORVASTATIN CALCIUM) 1 tablet at bedtime LIPITOR 20 MG  TABS (ATORVASTATIN CALCIUM) 1 tablet mon ,wed, fri, sunday LIPITOR 20 MG  TABS (ATORVASTATIN CALCIUM) 1 by mouth mon,wed,frid, sun ASPIRIN 81 MG  TABS (ASPIRIN) 1 tab by mouth once daily * ASA 81MG  qd PROAIR HFA 108 (90 BASE) MCG/ACT  AERS (ALBUTEROL SULFATE) 1-2 puffs q 4-6 hrs prn COUMADIN 5 MG TABS (WARFARIN SODIUM) AS DIRECTED BY DR.NISHAN COUMADIN 5 MG TABS (WARFARIN SODIUM) AS DIRECTED BY DR.NISHAN MULTIVITAMINS  TABS (MULTIPLE VITAMIN) 1 by mouth once daily COUMADIN 5 MG TABS (WARFARIN SODIUM) Sunday - 1 tab, Monday - 1 tab, Tuesday - 1 tab, Wednesday - 1 tab, Thursday - 1 tab, Friday - 1 tab, Saturday - 1.5 tabs COUMADIN 5 MG TABS (WARFARIN SODIUM) Use as directed by anticoagulation clinic. ALLOPURINOL 100 MG TABS (ALLOPURINOL) 1 once daily. If this is started , check PT/INR after 1 week ALLOPURINOL 100 MG TABS (ALLOPURINOL) 1 once daily. If this is started , check PT/INR after 1 week MOVIPREP 100 GM  SOLR (PEG-KCL-NACL-NASULF-NA ASC-C) As per prep instructions. FLUTICASONE PROPIONATE 50 MCG/ACT SUSP  (FLUTICASONE PROPIONATE) 1 spray two times a day as needed       Anticoagulant Therapy  Managed by: [name], PharmD Referring MD: [name] PCP: [name], MD Supervising MD: [name], [name] Indication 1: Atrial Fibrillation (ICD-427.31) Lab Used: [facility] Site: [facility] INR POC 2.7 INR RANGE 2 - 3  Dietary changes: no    Health status changes: no    Bleeding/hemorrhagic complications: no    Recent/future hospitalizations: no    Any changes in medication regimen? no    Recent/future dental: no  Any missed doses?: no       Is patient compliant with meds? yes       Allergies: No Known Drug Allergies  Anticoagulation Management History:      The patient is taking warfarin and comes in today for a routine follow up visit.  Negative risk factors for bleeding include an age less than 62 years old and no history of CVA/TIA.  The bleeding index is 'low risk'.  Positive CHADS2 values include History of HTN.  Negative CHADS2 values include Age > 9 years old, History of Diabetes, and Prior Stroke/CVA/TIA.  The start date was 02/12/2007.  His last INR was 2.5.  Anticoagulation responsible provider: [name], [name].  INR POC: 2.7.  Cuvette Lot#: [id number].  Exp: 01/2011.    Anticoagulation Management Assessment/Plan:      The patient's current anticoagulation dose is Coumadin 5 mg tabs: Use as directed by anticoagulation clinic..  The target INR is 2 -  3.  The next INR is due 03/06/2010.  Anticoagulation instructions were given to patient.  Results were reviewed/authorized by [name], PharmD.  He was notified by [name] PharmD.         Prior Anticoagulation Instructions: INR 2.6  Coumadin 5 mg tablets - Continue 1 tablet daily except 1.5 tablets on Saturdays.   Current Anticoagulation Instructions: INR 2.7  Continue same dose of 1 tablet every day except 1 1/2 tablets on Saturday.  Recheck INR in 4 weeks.  Prescriptions: COUMADIN 5 MG TABS (WARFARIN  SODIUM) Use as directed by anticoagulation clinic.  #60 x 3   Entered by:   [name] PharmD   Authorized by:   Colon Branch, MD, [name]   Signed by:   [name] PharmD on 02/06/2010   Method used:   Electronically to        [facility]* (retail)       [address] [place], [place]  [address]       Ph: [phone]       Fax: [phone]   RxID:   [id number]

## 2010-02-19 ENCOUNTER — Ambulatory Visit (INDEPENDENT_AMBULATORY_CARE_PROVIDER_SITE_OTHER): Payer: BC Managed Care – PPO

## 2010-02-19 ENCOUNTER — Encounter: Payer: Self-pay | Admitting: Internal Medicine

## 2010-02-19 DIAGNOSIS — I4891 Unspecified atrial fibrillation: Secondary | ICD-10-CM

## 2010-02-19 DIAGNOSIS — Z7901 Long term (current) use of anticoagulants: Secondary | ICD-10-CM

## 2010-02-19 LAB — CONVERTED CEMR LAB: INR: 2.9

## 2010-02-26 DIAGNOSIS — I4891 Unspecified atrial fibrillation: Secondary | ICD-10-CM

## 2010-02-28 NOTE — Assessment & Plan Note (Signed)
Summary: PT CHECK--PH      Allergies Added: NKDA Nurse Visit   Vital Signs:  Patient profile:   65 year old male Weight:      224 pounds Temp:     98.1 degrees F Pulse rate:   73 / minute BP sitting:   100 / 78  Current Medications (verified): 1)  Advair Diskus 250-50 Mcg/dose  Misc (Fluticasone-Salmeterol) .... Inhale 1 Puff By Mouth Every 12 Hours, 2)  Lisinopril 40 Mg  Tabs (Lisinopril) .... 1/2 Tab Daily 3)  Metoprolol Tartrate 50 Mg  Tabs (Metoprolol Tartrate) .... 1/2 By Mouth Two Times A Day 4)  Lipitor 20 Mg  Tabs (Atorvastatin Calcium) .Marland Kitchen.. 1 Tablet Mon ,wed, Fri, Sunday 5)  Aspirin 81 Mg  Tabs (Aspirin) .... 1 Tab By Mouth Once Daily 6)  Proair Hfa 108 (90 Base) Mcg/act  Aers (Albuterol Sulfate) .... 1-2 Puffs Q 4-6 Hrs Prn 7)  Multivitamins  Tabs (Multiple Vitamin) .... 1 By Mouth Once Daily 8)  Coumadin 5 Mg Tabs (Warfarin Sodium) .... Use As Directed By Anticoagulation Clinic. 9)  Fluticasone Propionate 50 Mcg/act Susp (Fluticasone Propionate) .... 1 Spray Two Times A Day As Needed 10)  Allopurinol 300 Mg Tabs (Allopurinol) .... 1/2 Once Daily (Check Pt/inr  5 Days Afterward If This Is Started)  Allergies (verified): No Known Drug Allergies Laboratory Results   Blood Tests      INR: 2.9   (Normal Range: 0.88-1.12   Therap INR: 2.0-3.5)    Orders Added: 1)  Est. Patient Level I [99211] 2)  Protime [85610QW]    VITAL SIGNS:  Patient Profile:   64 Years Old Male CC:      PT/INR Height:     74.75 inches Weight:      224 pounds BP sitting:   100 / 78  (left arm) Temp:     98 .1 degrees F. Pulse rate:   73      ANTICOAGULATION RECORD PREVIOUS REGIMEN & LAB RESULTS Anticoagulation Diagnosis:  Atrial Fibrillation (ICD-427.31) on  05/16/2008 Previous INR Goal Range:  2 - 3 on  05/16/2008 Previous INR:  2.5 on  01/23/2009 Previous Coumadin Dose(mg):  5mg  on  10/11/2009   NEW REGIMEN & LAB RESULTS Current INR: 2.9 Current Coumadin Dose(mg): 1 tab  daily except 1 1/2 tab on Sat Regimen: same  Provider: Dulcey Riederer Repeat testing in: f/u as instructed by cardiology  Anticoagulation Visit Questionnaire Coumadin dose missed/changed:  No Abnormal Bleeding Symptoms:  No  Any diet changes including alcohol intake, vegetables or greens since the last visit:  No Any illnesses or hospitalizations since the last visit:  No Any signs of clotting since the last visit (including chest discomfort, dizziness, shortness of breath, arm tingling, slurred speech, swelling or redness in leg):  No  MEDICATIONS ADVAIR DISKUS 250-50 MCG/DOSE  MISC (FLUTICASONE-SALMETEROL) INHALE 1 PUFF by mouth EVERY 12 HOURS, LISINOPRIL 40 MG  TABS (LISINOPRIL) 1/2 tab daily METOPROLOL TARTRATE 50 MG  TABS (METOPROLOL TARTRATE) 1/2 by mouth two times a day LIPITOR 20 MG  TABS (ATORVASTATIN CALCIUM) 1 tablet mon ,wed, fri, sunday ASPIRIN 81 MG  TABS (ASPIRIN) 1 tab by mouth once daily PROAIR HFA 108 (90 BASE) MCG/ACT  AERS (ALBUTEROL SULFATE) 1-2 puffs q 4-6 hrs prn MULTIVITAMINS  TABS (MULTIPLE VITAMIN) 1 by mouth once daily COUMADIN 5 MG TABS (WARFARIN SODIUM) Use as directed by anticoagulation clinic. FLUTICASONE PROPIONATE 50 MCG/ACT SUSP (FLUTICASONE PROPIONATE) 1 spray two times a day as needed ALLOPURINOL 300  MG TABS (ALLOPURINOL) 1/2 once daily (check PT/INR  5 days afterward if this is started)

## 2010-03-06 ENCOUNTER — Encounter (INDEPENDENT_AMBULATORY_CARE_PROVIDER_SITE_OTHER): Payer: BC Managed Care – PPO

## 2010-03-06 ENCOUNTER — Encounter: Payer: Self-pay | Admitting: Cardiovascular Disease

## 2010-03-06 DIAGNOSIS — Z7901 Long term (current) use of anticoagulants: Secondary | ICD-10-CM

## 2010-03-06 DIAGNOSIS — I4891 Unspecified atrial fibrillation: Secondary | ICD-10-CM

## 2010-03-15 NOTE — Medication Information (Signed)
Summary: Reginald Green   Anticoagulant Therapy  Managed by: [name], PharmD Referring MD: [name] PCP: [name], MD Supervising MD: Eden Emms MD, Theron Arista Indication 1: Atrial Fibrillation (ICD-427.31) Lab Used: [facility] Site: [facility] INR POC 2.6 INR RANGE 2 - 3  Dietary changes: no    Health status changes: no    Bleeding/hemorrhagic complications: no    Recent/future hospitalizations: no    Any changes in medication regimen? yes       Details: started flonase and allopurinol  Recent/future dental: no  Any missed doses?: no       Is patient compliant with meds? yes       Allergies: No Known Drug Allergies  Anticoagulation Management History:      The patient is taking warfarin and comes in today for a routine follow up visit.  Negative risk factors for bleeding include an age less than 74 years old and no history of CVA/TIA.  The bleeding index is 'low risk'.  Positive CHADS2 values include History of HTN.  Negative CHADS2 values include Age > 8 years old, History of Diabetes, and Prior Stroke/CVA/TIA.  The start date was 02/12/2007.  His last INR was 2.9.  Anticoagulation responsible provider: Eden Emms MD, Theron Arista.  INR POC: 2.6.  Cuvette Lot#: [id number].  Exp: 01/2011.    Anticoagulation Management Assessment/Plan:      The patient's current anticoagulation dose is Coumadin 5 mg tabs: Use as directed by anticoagulation clinic..  The target INR is 2 - 3.  The next INR is due 04/03/2010.  Anticoagulation instructions were given to patient.  Results were reviewed/authorized by [name], PharmD.  He was notified by [name] PharmD.         Prior Anticoagulation Instructions: INR 2.7  Continue same dose of 1 tablet every day except 1 1/2 tablets on Saturday.  Recheck INR in 4 weeks.   Current Anticoagulation Instructions: INR 2.6  Continue same dose of 1 tablet every day except 1 1/2 tablets on Saturday.  Recheck INR in 4 weeks.

## 2010-04-03 ENCOUNTER — Ambulatory Visit (INDEPENDENT_AMBULATORY_CARE_PROVIDER_SITE_OTHER): Payer: BC Managed Care – PPO | Admitting: *Deleted

## 2010-04-03 DIAGNOSIS — I4891 Unspecified atrial fibrillation: Secondary | ICD-10-CM

## 2010-04-03 NOTE — Patient Instructions (Signed)
INR 2.4 Return to clinic in 4 weeks Cont with current regimen

## 2010-04-11 ENCOUNTER — Other Ambulatory Visit: Payer: Self-pay

## 2010-04-11 MED ORDER — METOPROLOL TARTRATE 50 MG PO TABS: 50.0000 mg | ORAL_TABLET | ORAL | Status: DC

## 2010-05-01 ENCOUNTER — Ambulatory Visit (INDEPENDENT_AMBULATORY_CARE_PROVIDER_SITE_OTHER): Payer: BC Managed Care – PPO | Admitting: *Deleted

## 2010-05-01 DIAGNOSIS — I4891 Unspecified atrial fibrillation: Secondary | ICD-10-CM

## 2010-05-01 LAB — POCT INR: INR: 2.6

## 2010-05-22 NOTE — Assessment & Plan Note (Signed)
[facility] HEALTHCARE                            CARDIOLOGY OFFICE NOTE   NAME:[name], Reginald [name]                     MRN:          [id number]  DATE:05/19/2007                            DOB:          [birth year]    Reginald [name] returns today for followup.   He has had atrial fibrillation.  He had a cardioversion March 30 and  quickly reverted back to A fibrillation.  We subsequently started him on  flecainide 100 b.i.d. He had a stress test done April 24, 2007 with no  proarrhythmia.  He has been therapeutic on his Coumadin.  We had a  discussion today regarding repeat attempted cardioversion.  We will set  this up for May 20.   Has not had any bleeding diathesis.  He just had a nice cruise to the  [place] the [place].   He has a bit of COPD and has some bronchitis and a URI now.  I told him  waiting another week would be good in terms of clearing up any lung  issues.   The patient continues to understand the risk of stroke during  cardioversion.  His INRs have been therapeutic.  He did not have any  problems with his last attempt outside of not maintaining sinus rhythm.  Review of systems otherwise negative.   CURRENT MEDICATIONS:  1. Advair 250/50 b.i.d.  2. Aspirin.  3. Lisinopril 20 a day.  4. Lipitor 20 a day.  5. Coumadin as directed.  6. Flecainide 100 b.i.d.  7. Metoprolol 25 a day.   PHYSICAL EXAMINATION:  GENERAL:  Exam is remarkable for healthy-  appearing, middle-aged white male in no distress.  VITAL SIGNS:  His blood pressure is 126/87, pulse 94 and irregular,  weight 234, afebrile, respiratory 14.  HEENT:  Unremarkable.  NECK:  Carotids are without bruit.  No lymphadenopathy, thyromegaly or  JVP elevation.  LUNGS:  Mild expiratory wheezes and rhonchi.  Good diaphragmatic motion.  HEART:  S1 and S2.  Normal heart sounds. PMI normal.  ABDOMEN:  Benign.  Bowel sounds positive.  No AAA, no tenderness, no  bruit, no hepatosplenomegaly or  reflux.  EXTREMITIES:  Pulses intact, no edema.  NEURO:  Nonfocal.  SKIN:  Warm and dry.  MUSCULOSKELETAL: No muscular weakness.   LABORATORY DATA:  Lab work reviewed from March 30.  Hematocrit was 44.  INR was 2.4, creatinine was 1, potassium was 4.5.   IMPRESSION:  1. Atrial fibrillation.  Failed cardioversion x1.  Antiarrhythmic      therapy instituted.  Followup cardioversion May 20.  2. Anticoagulation.  INRs been therapeutic for more than 3 weeks.  INR      today was 3.7.  3. Upper respiratory infection with chronic obstructive pulmonary      disease.  Continue to use Advair symptomatic care.  4. Hypertension.  Continue lisinopril, low-salt diet.  5. Hypercholesterolemia.  Continue Lipitor.  Lipid liver profile in 6      months.   Further recommendation will be based on his response to repeat  cardioversion.     Reginald [name]. Eden Emms, MD, [facility]  Electronically Signed    PCN/MedQ  DD: 05/20/2007  DT: 05/20/2007  Job #: [id number]

## 2010-05-22 NOTE — Assessment & Plan Note (Signed)
[facility] HEALTHCARE                            CARDIOLOGY OFFICE NOTE   NAME:Reginald [name]                     MRN:          [id number]  DATE:03/31/2007                            DOB:          [birth year]    Reginald [name] returns today for followup.  He has isolated atrial fibrillation.  He has been therapeutic on Coumadin for over 3 weeks.  We had a long  discussion today.  He is fairly asymptomatic.  His echocardiogram was  normal without valve disease and good LV function.  His Myoview was  nonischemic. I told Reginald [name] that in more elderly patients with asymptomatic  atrial fibrillation, rate control on Coumadin seemed to be fine and just  as good; however, he is only 65 years old.  He does not necessarily want  to be on Coumadin the rest of his life. I told him that if it were me, I  would try one attempt at cardioversion.  I would not necessarily  reattempt it on antiarrhythmics if he is unable to be converted or  converts back to atrial fibrillation.  He is in agreement with this  plan.  We will try to arrange cardioversion for next week.  The risks of  acute cardioversion including stroke were discussed.  He is willing to  proceed.   REVIEW OF SYSTEMS:  Remarkable for no significant bleeding diathesis on  Coumadin.  In fact, he says he seems to bleed easier on aspirin.  He  does have asthma and is not having any active wheezing.  I told him to  take a couple puffs of his rescue inhaler prior to the cardioversion and  explained to him that anesthesia would be a little more careful with him  given his asthma.  He does take Advair 250/50 twice a day.  He has not  had any significant coughing or fever.   MEDICATIONS:  1. Advair 250/50 b.i.d.  2. Multivitamins.  3. Baby aspirin.  4. Lisinopril 20 a day.  5. Metoprolol 25 a day.  6. Lipitor 20 mg.  7. Coumadin as directed.   PHYSICAL EXAMINATION:  VITAL SIGNS:  Remarkable for atrial fibrillation  at a rate of  80, blood pressure 115/80, weight 232, afebrile,  respiratory rate 14.  HEENT:  Unremarkable.  NECK:  Carotids normal without bruit.  No lymphadenopathy, thyromegaly,  or JVP elevation.  LUNGS:  Clear with good diaphragmatic motion.  No wheezing.  CARDIOVASCULAR:  S1-S2, normal heart sounds. PMI normal.  ABDOMEN:  Benign.  Bowel sounds positive.  No AAA, no tenderness, no  hepatosplenomegaly, no hepatojugular reflux.  EXTREMITIES:  Distal pulses intact.  No edema.  NEUROLOGIC:  Nonfocal.  SKIN:  Warm and dry.  MUSCULOSKELETAL:  No musculoskeletal weakness.   IMPRESSION:  1. Atrial fibrillation for elective cardioversion next week.  2. Anticoagulation.  Pro time is therapeutic, if anything a little on      the high side.  Will check stat pro times just prior to      cardioversion.  3. Hypertension, currently well controlled.  Continue lisinopril 20 a  day.  4. Hypercholesterolemia.  Continue Lipitor 20 a day. Lipid and liver      profile in 6 months.     [name], MD, [facility]  Electronically Signed    PCN/MedQ  DD: 03/31/2007  DT: 03/31/2007  Job #: [id number]

## 2010-05-22 NOTE — Consult Note (Signed)
NAME:  Reginald [name], Reginald [name] NO.:  [id number]   MEDICAL RECORD NO.:  [id number]          PATIENT TYPE:  OIB   LOCATION:  2855                         FACILITY:  [facility]   PHYSICIAN:  [name], MD, FACCDATE OF BIRTH:  1945/01/30   DATE OF CONSULTATION:  05/27/2007  DATE OF DISCHARGE:                                 CONSULTATION   A 65 year old patient with AFib and previous failed cardioversion.  The  patient has been loaded with flecainide.  He has been on therapeutic  Coumadin.  Repeat cardioversion was attempted on antiarrhythmics.   This patient was treated with Sodium Pentothal by Dr. Jairo Ben.  Two 200-joules biphasic shocks were delivered.  The patient converted to  normal sinus rhythm at a rate of 55.   IMPRESSION:  Successful cardioversion on therapeutic Coumadin.  The  patient will follow up with our Coumadin Clinic in a week.  We will  maintain him on flecainide.   I told the patient that if he reverts back to atrial fibrillation, we  would not attempt any further cardioversion and rate control and  anticoagulation would be in order.  He tolerated procedure well with no  immediate neurological sequela.      [name], MD, [facility]  Electronically Signed     PCN/MEDQ  D:  05/27/2007  T:  05/28/2007  Job:  [id number]

## 2010-05-22 NOTE — Assessment & Plan Note (Signed)
[facility] HEALTHCARE                            CARDIOLOGY OFFICE NOTE   NAME:Reginald [name], Reginald [name]                     MRN:          [id number]  DATE:02/12/2007                            DOB:          May 20, 1945    [name] is a pleasant 65 year old patient referred by Dr. Alwyn Ren  for a fib.  The patient is relatively asymptomatic.  However, he was  found to be in atrial fibrillation at last office visit.  In retrospect  when I talk to the patient he has noted that his heart rate may be  little higher than usual in the morning.   He has not had any significant palpitations, PND, orthopnea and has been  no chest pain or dyspnea.  Has not had a previous heart workup before.   The patient does not have an EKG in our physical chart since 2001.  However, he seems to think that his higher than normal heart rate  started about a month ago.  He has no contraindications to Coumadin.   I talked at length to [name] regarding his relatively  asymptomatic atrial fibrillation.  First explained to him that we needed  to make sure his heart was structurally sound with a 2-D echocardiogram  and also to rule out coronary disease in light of possible need for  antiarrhythmic this with a stress Myoview.  Would also like to see how  his blood pressure and rate control are with exercise.   We then talked about rate control conversion and anticoagulation.   I told the patient that outside of his relatively young age, a lot of  people did perfectly well with rate control and Coumadin alone.  However, since he is only 30,  I think that one attempted cardioversion  may be in order.   The patient has not had a recent echo or stress test.  He has no other  structural heart disease that is known.  He is never been cathed and  does not have significant chest pain with his arrhythmia.   His past medical history is remarkable for previous hiatal hernia  repair, previous  appendectomy, previous surgery for undescended testes,  some insomnia. Dr. Frederik Pear note indicated some previous history of  chest pressure.  However, he did not really complain about this to me.  Dr. Frederik Pear note indicated that pain was in the substernal area and  lasted about 30 minutes and was present almost every other day.   His past medical history otherwise is remarkable for hypertension,  hyperuricemia, Gilbert's disease, occasional PVCs.   FAMILY HISTORY:  Remarkable for a maternal uncle with previous CABG and  endarterectomy.  Mother with a CVA and endarterectomy as well as  hypertension.   The patient does use alcohol on occasion.  He is fairly sedentary.  He  is a previous smoker.  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  He is married with three children.   He has no known allergies.  His medications currently include Advair  250/50, an aspirin a day, lisinopril 20 a day,  metoprolol 25 a day and  Lipitor 20 a day.   EXAM:  Remarkable for healthy-appearing middle-aged white male in no  distress.  Affect is appropriate.  Blood pressure is 130/80, pulse is 80 and irregular.  HEENT:  Unremarkable.  Carotids are without bruit, no lymphadenopathy,  thyromegaly, JVP elevation.  Lungs are clear be diaphragmatic motion wheezing.  S1-S2 normal heart sounds.  PMI normal.  ABDOMEN:  Benign.  Bowel sounds positive.  No AAA no tenderness, no  bruit, status post appendectomy.  Distal pulse intact, no edema.  NEURO:  Nonfocal.  SKIN:  Warm and dry.   EKG shows atrial fibrillation, nonspecific ST-T wave changes but no  previous MI or significant QRS widening or heart block.  Baseline QT  interval is 392.   IMPRESSION:  1. Essentially asymptomatic atrial fibrillation.  Rate control fine on      metoprolol, start Coumadin 5 mg a day.  Arrange to be seen in      Coumadin Clinic see back in 4-6 weeks to discuss cardioversion.  2. Rule out  structural heart disease in the setting of atrial      fibrillation, check 2-D echocardiogram to assess atrial size as      well.  3. Rule out coronary disease.  The patient described chest pain      syndrome to Dr. Alwyn Ren.  He is in A fib.  Important to rule out      significant ischemic disease.  If we have to start antiarrhythmic.      Follow-up stress Myoview.  This will also allow me to check for      chronotropic response on beta blockers and adequacy control his      blood pressure.  4. Hypertension likely related to his atrial fibrillation currently      under good control.  Continue current dose lisinopril and beta      blocker.  5. Hyperlipidemia.  Follow with Dr. Alwyn Ren.  Continue Lipitor 20 a      day, low cholesterol diet.  6. Previous smoker with a question asthma, chronic obstructive      pulmonary disease.  Continue Advair 250 50 b.i.d. follow-up PFTs in      a year.   I will see Mr. Oliff back in 4-6 weeks and depending on the results  of his echo and stress test, we will discuss the possibility of an  attempted cardioversion.    [name], MD, [facility]  Electronically Signed   PCN/MedQ  DD: 02/12/2007  DT: 02/13/2007  Job #: [id number]   cc:   Titus Dubin. Alwyn Ren, MD,FACP,FCCP

## 2010-05-22 NOTE — Assessment & Plan Note (Signed)
[facility] HEALTHCARE                            CARDIOLOGY OFFICE NOTE   NAME:Reginald [name]                     MRN:          [id number]  DATE:06/08/2007                            DOB:          Jul 15, 1945    Reginald [name] returns today for followup.  He has had atrial fibrillation.  I  tried to cardiovert him without antiarrhythmics once.  He failed this.  We cardioverted him last week on flecainide, and he failed this.  He is  back in atrial fibrillation.  We had a long discussion.  I think at this  point, anticoagulation rate control is in order.  I told him to stop his  flecainide.  We will increase his Lopressor to 25 b.i.d.  He will follow  up with the Coumadin Clinic in 3-4 weeks.  Hypertension seems to have  been well-controlled.   REVIEW OF SYSTEMS:  Negative, outside of a little bit of burning and  itching in the anterior patch site from his cardioversion.   MEDICATIONS:  1. Advair 250/50 b.i.d.  2. Aspirin 81 a day.  3. Lisinopril 20 a day.  4. Lipitor 20 a day.  5. Coumadin as directed.  6. Flecainide to be stopped.  7. Metoprolol 25 b.i.d.   He uses the [employer] out by [employer] on [address].   PHYSICAL EXAMINATION:  GENERAL:  A healthy-appearing middle-aged white  male in no distress.  VITAL SIGNS:  His weight is 234, blood pressure 122/80, pulse 82 and  irregular, afebrile, respiratory rate 14.  HEENT:  Unremarkable.  Carotids normal without bruit.  No  lymphadenopathy, thyromegaly, or JVP elevation.  LUNGS:  Clear with good diaphragmatic motion.  No wheezing.  CARDIAC:  S1-S2 with normal heart sounds.  PMI normal.  ABDOMEN:  Benign.  Bowel sounds positive.  No AAA, no bruit, no  hepatosplenomegaly, no hepatojugular reflux.  EXTREMITIES:  No edema.  NEUROLOGIC:  Nonfocal.  SKIN:  Warm and dry.  CHEST:  His chest actually looks fine.  There is mild erythema and no  blistering.  MUSCULOSKELETAL:  No muscular  weakness.   EKG shows atrial fibrillation with nonspecific ST-T wave changes.   IMPRESSION:  1. Atrial fibrillation, failed cardioversion x2.  Stop flecainide,      increase beta blocker.  Follow up Coumadin in 3-4 weeks.  2. Hypertension, currently well-controlled.  Continue beta-blocker and      low-sodium diet.  3. Hypercholesterolemia.  Continue Lipitor.  Lipid and liver profile      in 6 months.     [name], MD, [facility]  Electronically Signed    PCN/MedQ  DD: 06/08/2007  DT: 06/08/2007  Job #: [id number]

## 2010-05-22 NOTE — Procedures (Signed)
[facility]                              EXERCISE TREADMILL   NAME:Agostino, SECUNDINO ELLITHORPE                     MRN:          [id number]  DATE:04/24/2007                            DOB:          06-27-1945    INDICATIONS FOR PROCEDURE:  A 65 year old patient with previous  cardioversion failed, now on flecainide.   The patient exercised 6 minutes on advanced stage Bruce protocol.  Exercise was stopped due to fatigue and shortness of breath.  Maximum  heart rate was 146.  Peak blood pressure was 149/79.   EKG showed atrial fibrillation with a QT interval of 402. With stress  there was an occasional PVC but no nonsustained VT.   IMPRESSION:  No proarrhythmia on flecainide.  Will continue  antiarrhythmic therapy with eye towards repeat cardioversion.     [name], MD, [facility]  Electronically Signed    PCN/MedQ  DD: 04/24/2007  DT: 04/24/2007  Job #: [id number]

## 2010-05-22 NOTE — Assessment & Plan Note (Signed)
[facility] HEALTHCARE                            CARDIOLOGY OFFICE NOTE   NAME:Reginald [name]                     MRN:          [id number]  DATE:04/15/2007                            DOB:          Jun 18, 1945    Reginald [name] returns today for followup.  I did a cardioversion on him on April 06, 2007. It was successful after two shocks; however, clinically he is  back in atrial fibrillation today. He is asymptomatic.  His INRs 2.8.  I  talked to Ashten at length.  I told him I would confirm the irregular  pulse by EKG.  We talked at length about possible antiarrhythmic  therapy.  I think he is a reasonable candidate for flecainide.  I  explained to him that we would have him on it for 6-8 weeks.  We would  do a treadmill rule out proarrhythmia and repeat cardioversion in about  8 weeks. If he does not want to do this, we can continue to have him  follow up in the Coumadin Clinic and treat him with anticoagulation and  rate control.  He will let me know what he wants to do.   REVIEW OF SYSTEMS:  Otherwise negative. In particular, he has not had  any TIA-like symptoms or bleeding diathesis.   MEDICATIONS:  1. Advair 250 /50.  2. An aspirin a day 81 mg.  3. Lisinopril 20 mg a day.  4. Lopressor 25 a day.  5. Lipitor 20 a day.  6. Coumadin as directed. INR 2.8 today.   PHYSICAL EXAMINATION:  GENERAL:  Remarkable for no significant burns on  his chest from the cardioversion.  VITAL SIGNS:  Weight 232, blood pressure 137/80, pulse 74 and irregular,  afebrile, respiratory rate 14.  HEENT:  Unremarkable.  NECK:  Carotids are without bruit. No lymphadenopathy, thyromegaly JVP  elevation.  LUNGS:  Clear with good diaphragmatic motion.  No wheezing.  CARDIAC:  S1-S2 normal heart sounds. PMI normal.  ABDOMEN:  Benign.  Bowel sounds positive.  No hepatosplenomegaly or  hepatojugular reflux.  EXTREMITIES:  Distal pulses intact, no edema.  NEUROLOGIC:  Nonfocal.  SKIN:   Warm and dry.  No muscular weakness.   IMPRESSION:  1 . Atrial fibrillation appears recurrent.  Patient to  decide whether he wants to take antiarrhythmic therapy or continue rate  control and anticoagulation.  1. Hyperlipidemia.  Continue Lipitor, lipid and liver profile in 6      months.  2. Asthma.  Continue Advair 250/50, p.r.n. Claritin or Allegra.  3. Hypertension currently well controlled.  Continue lisinopril 20 mg      a day, low-salt diet.   I will see him back depending on how he wants to proceed, but I think it  is reasonable to try flecainide and one more cardioversion.     [name], MD, [facility]  Electronically Signed    PCN/MedQ  DD: 04/15/2007  DT: 04/15/2007  Job #: [id number]

## 2010-05-22 NOTE — Op Note (Signed)
NAME:  Reginald [name], Reginald [name] NO.:  [id number]   MEDICAL RECORD NO.:  [id number]          PATIENT TYPE:  OIB   LOCATION:  2852                         FACILITY:  [facility]   PHYSICIAN:  [name], MD, FACCDATE OF BIRTH:  Jul 17, 1945   DATE OF PROCEDURE:  04/06/2007  DATE OF DISCHARGE:                               OPERATIVE REPORT   PROCEDURE:  Direct current cardioversion.   INDICATIONS:  A 65 year old patient with atrial fibrillation.   PROCEDURE:  The patient was sedated with 250 mg of sodium Pentothal.  Two synchronized biphasic 200-joule shocks were applied.  The patient  converted from atrial fibrillation at a rate of 90 to normal sinus  rhythm with a rate of 70.   INR at the time of the procedure was 2.4.   IMPRESSION:  Successful direct current cardioversion with no obvious  complications immediately.   The patient will follow up with me in the office in a week.      [name], MD, [facility]  Electronically Signed     PCN/MEDQ  D:  04/06/2007  T:  04/06/2007  Job:  [id number]

## 2010-05-22 NOTE — Assessment & Plan Note (Signed)
[facility] HEALTHCARE                            CARDIOLOGY OFFICE NOTE   NAME:[name], Reginald NEEDS                     MRN:          [id number]  DATE:12/11/2007                            DOB:          05/15/1945    Reginald [name] returns today in followup.  He has chronic atrial  fibrillation.  He has failed cardioversions on multiple times including  on antiarrhythmics.   He gets occasional palpitations, but overall has been doing well.  He  has significant COPD and this does cause some exertional dyspnea.   He has been compliant with his medications.   His overall LV function is normal.  His most recent echo was on February 19, 2007.  He had a Myoview study done in February 2009 as well, which  was nonischemic.  He has no documented coronary artery disease.   REVIEW OF SYSTEMS:  Otherwise negative.  His Coumadin level has been  therapeutic with no bleeding diathesis.   MEDICATIONS:  1. Advair 50/250 b.i.d.  2. Multivitamins.  3. Aspirin.  4. Lisinopril 20 a day.  5. Lipitor 20 a day.  6. Coumadin as directed.  7. Lopressor 25 b.i.d.   PHYSICAL EXAMINATION:  VITAL SIGNS:  Remarkable for being in AFib at a  rate of 80 and afebrile.  HEENT:  Unremarkable.  NECK:  Carotids are normal without bruit.  No lymphadenopathy,  thyromegaly, or JVP elevation.  LUNGS:  Clear.  Good diaphragmatic motion.  No wheezing.  CARDIAC:  S1 and S2 with normal heart sounds.  PMI normal.  ABDOMEN:  Benign.  Bowel sounds positive.  No AAA, no tenderness, no  bruit, no hepatosplenomegaly, no hepatojugular reflux.  EXTREMITIES:  Distal pulses are intact.  No edema.  NEUROLOGIC:  Nonfocal,.  SKIN:  Warm and dry.  MUSCULOSKELETAL:  No muscular weakness.   IMPRESSION:  1. Chronic atrial fibrillation with good rate control and      anticoagulation.  2. Hypertension, currently well controlled.  Continue current doses of      medicine and low-sodium diet.  3. Chronic  obstructive pulmonary disease.  Continue inhalers.      Baseline dyspnea have not worsened.  He has had the flu shot.   I will see him back in 3 months.     [name], MD, [facility]  Electronically Signed    PCN/MedQ  DD: 12/11/2007  DT: 12/12/2007  Job #: [id number]

## 2010-05-29 ENCOUNTER — Ambulatory Visit (INDEPENDENT_AMBULATORY_CARE_PROVIDER_SITE_OTHER): Payer: BC Managed Care – PPO | Admitting: *Deleted

## 2010-05-29 DIAGNOSIS — I4891 Unspecified atrial fibrillation: Secondary | ICD-10-CM

## 2010-05-29 LAB — POCT INR: INR: 2.6

## 2010-06-12 ENCOUNTER — Other Ambulatory Visit (INDEPENDENT_AMBULATORY_CARE_PROVIDER_SITE_OTHER): Payer: BC Managed Care – PPO

## 2010-06-12 DIAGNOSIS — M109 Gout, unspecified: Secondary | ICD-10-CM

## 2010-06-12 LAB — URINALYSIS
Bilirubin Urine: NEGATIVE
Ketones, ur: NEGATIVE
Nitrite: NEGATIVE
Total Protein, Urine: NEGATIVE
Urine Glucose: NEGATIVE
pH: 7 (ref 5.0–8.0)

## 2010-06-15 ENCOUNTER — Other Ambulatory Visit: Payer: Self-pay | Admitting: *Deleted

## 2010-06-15 MED ORDER — WARFARIN SODIUM 5 MG PO TABS: ORAL_TABLET | ORAL | Status: DC

## 2010-06-26 ENCOUNTER — Ambulatory Visit (INDEPENDENT_AMBULATORY_CARE_PROVIDER_SITE_OTHER): Payer: BC Managed Care – PPO | Admitting: *Deleted

## 2010-06-26 DIAGNOSIS — I4891 Unspecified atrial fibrillation: Secondary | ICD-10-CM

## 2010-07-14 ENCOUNTER — Other Ambulatory Visit: Payer: Self-pay | Admitting: Cardiovascular Disease

## 2010-07-16 ENCOUNTER — Telehealth: Payer: Self-pay | Admitting: Cardiovascular Disease

## 2010-07-16 NOTE — Telephone Encounter (Signed)
Spoke with medical records, they have received paperwork from [employer] requesting 5 years of the pt records. Pt aware, he will call back if anything else needed Deliah Goody

## 2010-07-16 NOTE — Telephone Encounter (Signed)
Pt is waiting for an attending physician statement to be filled out and returned to [facility].  This has been mailed but not received.  Reginald Green is the agent.  Do you have this to fill out or do they need to resend?  Call patient and advise.

## 2010-07-16 NOTE — Telephone Encounter (Signed)
Spoke with pt, he is aware i have sent a message to [employer] to see if they have any paperwork. Awaiting their response. Reginald Green

## 2010-07-24 ENCOUNTER — Encounter: Payer: BC Managed Care – PPO | Admitting: *Deleted

## 2010-07-24 ENCOUNTER — Ambulatory Visit (INDEPENDENT_AMBULATORY_CARE_PROVIDER_SITE_OTHER): Payer: BC Managed Care – PPO | Admitting: *Deleted

## 2010-07-24 DIAGNOSIS — I4891 Unspecified atrial fibrillation: Secondary | ICD-10-CM

## 2010-08-06 ENCOUNTER — Emergency Department (HOSPITAL_COMMUNITY): Payer: BC Managed Care – PPO

## 2010-08-06 ENCOUNTER — Emergency Department (HOSPITAL_COMMUNITY)
Admission: EM | Admit: 2010-08-06 | Discharge: 2010-08-06 | Disposition: A | Discharge status: Other Acute Inpatient Hospital | Payer: BC Managed Care – PPO | Source: Home / Self Care | Attending: Emergency Medicine | Admitting: Emergency Medicine

## 2010-08-06 ENCOUNTER — Emergency Department (HOSPITAL_COMMUNITY)
Admission: EM | Admit: 2010-08-06 | Discharge: 2010-08-06 | Disposition: A | Discharge status: Home / Self Care | Payer: BC Managed Care – PPO | Attending: Emergency Medicine | Admitting: Emergency Medicine

## 2010-08-06 DIAGNOSIS — Y92009 Unspecified place in unspecified non-institutional (private) residence as the place of occurrence of the external cause: Secondary | ICD-10-CM | POA: Insufficient documentation

## 2010-08-06 DIAGNOSIS — J449 Chronic obstructive pulmonary disease, unspecified: Secondary | ICD-10-CM | POA: Insufficient documentation

## 2010-08-06 DIAGNOSIS — R109 Unspecified abdominal pain: Secondary | ICD-10-CM | POA: Insufficient documentation

## 2010-08-06 DIAGNOSIS — J4489 Other specified chronic obstructive pulmonary disease: Secondary | ICD-10-CM | POA: Insufficient documentation

## 2010-08-06 DIAGNOSIS — S32509A Unspecified fracture of unspecified pubis, initial encounter for closed fracture: Secondary | ICD-10-CM | POA: Insufficient documentation

## 2010-08-06 DIAGNOSIS — R079 Chest pain, unspecified: Secondary | ICD-10-CM | POA: Insufficient documentation

## 2010-08-06 DIAGNOSIS — Z7901 Long term (current) use of anticoagulants: Secondary | ICD-10-CM | POA: Insufficient documentation

## 2010-08-06 DIAGNOSIS — I4891 Unspecified atrial fibrillation: Secondary | ICD-10-CM | POA: Insufficient documentation

## 2010-08-06 DIAGNOSIS — S329XXA Fracture of unspecified parts of lumbosacral spine and pelvis, initial encounter for closed fracture: Secondary | ICD-10-CM | POA: Insufficient documentation

## 2010-08-06 DIAGNOSIS — W19XXXA Unspecified fall, initial encounter: Secondary | ICD-10-CM | POA: Insufficient documentation

## 2010-08-06 DIAGNOSIS — R0602 Shortness of breath: Secondary | ICD-10-CM | POA: Insufficient documentation

## 2010-08-06 DIAGNOSIS — I10 Essential (primary) hypertension: Secondary | ICD-10-CM | POA: Insufficient documentation

## 2010-08-06 DIAGNOSIS — Z79899 Other long term (current) drug therapy: Secondary | ICD-10-CM | POA: Insufficient documentation

## 2010-08-06 DIAGNOSIS — R05 Cough: Secondary | ICD-10-CM | POA: Insufficient documentation

## 2010-08-06 DIAGNOSIS — W1809XA Striking against other object with subsequent fall, initial encounter: Secondary | ICD-10-CM | POA: Insufficient documentation

## 2010-08-06 DIAGNOSIS — Z7982 Long term (current) use of aspirin: Secondary | ICD-10-CM | POA: Insufficient documentation

## 2010-08-06 DIAGNOSIS — M25559 Pain in unspecified hip: Secondary | ICD-10-CM | POA: Insufficient documentation

## 2010-08-06 DIAGNOSIS — R059 Cough, unspecified: Secondary | ICD-10-CM | POA: Insufficient documentation

## 2010-08-06 LAB — COMPREHENSIVE METABOLIC PANEL
Alkaline Phosphatase: 64 U/L (ref 39–117)
BUN: 23 mg/dL (ref 6–23)
Calcium: 9.7 mg/dL (ref 8.4–10.5)
Creatinine, Ser: 0.87 mg/dL (ref 0.50–1.35)
GFR calc Af Amer: >60 mL/min (ref >60)
Glucose, Bld: 119 mg/dL — ABNORMAL HIGH (ref 70–99)
Potassium: 4.2 mEq/L (ref 3.5–5.1)
Total Protein: 7.1 g/dL (ref 6.0–8.3)

## 2010-08-06 LAB — DIFFERENTIAL
Eosinophils Relative: 0 % (ref 0–5)
Lymphocytes Relative: 9 % — ABNORMAL LOW (ref 12–46)
Lymphs Abs: 1.1 10*3/uL (ref 0.7–4.0)
Monocytes Absolute: 1.1 10*3/uL — ABNORMAL HIGH (ref 0.1–1.0)
Monocytes Relative: 10 % (ref 3–12)
Neutro Abs: 9.6 10*3/uL — ABNORMAL HIGH (ref 1.7–7.7)

## 2010-08-06 LAB — APTT: aPTT: 45 seconds — ABNORMAL HIGH (ref 24–37)

## 2010-08-06 LAB — CBC
HCT: 40.4 % (ref 39.0–52.0)
Hemoglobin: 14.7 g/dL (ref 13.0–17.0)
MCH: 34.1 pg — ABNORMAL HIGH (ref 26.0–34.0)
MCHC: 36.4 g/dL — ABNORMAL HIGH (ref 30.0–36.0)
MCV: 93.7 fL (ref 78.0–100.0)

## 2010-08-06 LAB — PROTIME-INR: Prothrombin Time: 28.6 seconds — ABNORMAL HIGH (ref 11.6–15.2)

## 2010-08-06 LAB — ETHANOL: Alcohol, Ethyl (B): <11 mg/dL (ref 0–11)

## 2010-08-06 MED ORDER — IOHEXOL 300 MG/ML  SOLN
100.0000 mL | Freq: Once | INTRAMUSCULAR | Status: AC | PRN
  Administered 2010-08-06: 100 mL via INTRAVENOUS

## 2010-08-10 ENCOUNTER — Telehealth (INDEPENDENT_AMBULATORY_CARE_PROVIDER_SITE_OTHER): Payer: Self-pay | Admitting: Orthopedic Surgery

## 2010-08-10 MED ORDER — BISACODYL 5 MG PO TBEC: 5.0000 mg | DELAYED_RELEASE_TABLET | Freq: Every day | ORAL | Status: AC | PRN

## 2010-08-10 MED ORDER — DOCUSATE SODIUM 100 MG PO CAPS: 100.0000 mg | ORAL_CAPSULE | Freq: Two times a day (BID) | ORAL | Status: AC

## 2010-08-10 MED ORDER — MAGNESIUM CITRATE PO SOLN: 296.0000 mL | Freq: Two times a day (BID) | ORAL | Status: DC | PRN

## 2010-08-10 MED ORDER — POLYETHYLENE GLYCOL 3350 17 GM/SCOOP PO POWD: 17.0000 g | Freq: Every day | ORAL | Status: AC

## 2010-08-10 NOTE — Telephone Encounter (Signed)
Pt called because he is constipated. Hasn't had a BM in about 5 days.

## 2010-08-17 ENCOUNTER — Telehealth (INDEPENDENT_AMBULATORY_CARE_PROVIDER_SITE_OTHER): Payer: Self-pay | Admitting: Orthopedic Surgery

## 2010-08-17 NOTE — Telephone Encounter (Signed)
Pt called worried he would run out of percocet before his appointment on Monday with Dr. Carola Frost. He said he does have some Vicodin 5/500 left over from dental procedure. Only taking 1 Percocet at a time at this point.  I suggested taking 1 Vicodin and 1/2 of a Percocet every 6 hours as needed for his pain. I believe that will last until his appointment on Monday.

## 2010-08-20 ENCOUNTER — Ambulatory Visit (INDEPENDENT_AMBULATORY_CARE_PROVIDER_SITE_OTHER): Payer: BC Managed Care – PPO | Admitting: *Deleted

## 2010-08-20 DIAGNOSIS — I4891 Unspecified atrial fibrillation: Secondary | ICD-10-CM

## 2010-08-21 ENCOUNTER — Encounter: Payer: BC Managed Care – PPO | Admitting: *Deleted

## 2010-09-17 ENCOUNTER — Ambulatory Visit (INDEPENDENT_AMBULATORY_CARE_PROVIDER_SITE_OTHER): Payer: BC Managed Care – PPO | Admitting: *Deleted

## 2010-09-17 DIAGNOSIS — I4891 Unspecified atrial fibrillation: Secondary | ICD-10-CM

## 2010-09-17 NOTE — Consult Note (Signed)
NAME:  Reginald Green, Reginald Green NO.:  [id number]  MEDICAL RECORD NO.:  [id number]  LOCATION:  [facility]                         FACILITY:  [facility]  PHYSICIAN:  Cherylynn Ridges, M.D.    DATE OF BIRTH:  October 09, 1945  DATE OF CONSULTATION:  08/06/2010 DATE OF DISCHARGE:  08/06/2010                                CONSULTATION   HISTORY OF PRESENT ILLNESS:  This is a 65 year old white male who was stepping off on to a bench when he slipped and fell on his buttock and left side.  There is no loss of consciousness.  This happened about 6:30 p.m. the day prior to presentation.  He tried to tough it out at home, but it was too painful and so he had his son bring him to the [facility] Emergency Department.  Workup there showed a left superior and inferior pubic rami fracture.  Because of his use of Coumadin, he was transferred over to [facility] for evaluation by the Trauma Team.  PAST MEDICAL HISTORY:  Significant for hypertension, COPD, gout, allergic rhinitis, dyslipidemia, and atrial fibrillation.  SURGICAL HISTORY:  Significant for herniorrhaphy.  SOCIAL HISTORY:  Significant for alcohol use about 2 beers per day.  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  ALLERGIES:  He has no known allergies.  MEDICATIONS: 1. He is on Advair 250/5 b.i.d. 2. Allopurinol 150 mg daily. 3. Aspirin 81 mg daily. 4. Coumadin 5 mg daily. 5. Flonase 1 spray in each nostril b.i.d. 6. Lipitor 20 mg. 7. Lisinopril 20 mg daily. 8. Metoprolol 25 mg b.i.d.  PRIMARY CARE PROVIDER:  Titus Dubin. Alwyn Ren, MD, FACP, FCCP.  His tetanus was up to date.  REVIEW OF SYSTEMS:  Negative II through XII systems with the exception of some pelvic pain and left chest wall pain.  PHYSICAL EXAMINATION:  VITAL SIGNS:  Temperature is 97.3, pulse 84, respirations were 18 and unlabored, blood pressure 124/96, O2 sats 97%. GENERAL:  This is a well-developed, well-nourished white male,  in no acute distress. SKIN:  Warm and dry without lacerations or edema although he had multiple ecchymoses on all the limbs. HEENT:  Head was normocephalic, atraumatic.  Eyes, PERRL.  Extraocular movements intact bilateral without injection, hemorrhage, edema, or ecchymosis.  Vision was grossly intact.  Ears, the left TM was clear. Right canal was filled with cerumen and could not assess the TM.  The auricles were without lesions and hearing was grossly intact bilaterally.  Face was without lesions, edema, or ecchymosis.  Facial movement and strength grossly intact without any obvious oral trauma or malocclusion. NECK:  Nontender without lesions.  Range of motion is grossly intact without pain. LUNGS:  Clear to auscultation bilaterally.  Chest excursion normal and equal. CARDIOVASCULAR:  Normal S1 and S2 without murmurs, rubs, or gallops.  It was irregularly irregular.  There is no auscultated bruit and peripheral pulses were palpable x4. ABDOMEN:  Soft and nontender with normoactive bowel sounds and no distention. PELVIS:  No lesions.  External genitalia without abnormality. RECTAL:  Not performed. EXTREMITIES:  The patient moved all extremities.  No deficits in strength or sensation although he  had some pain with leg extension bilaterally, especially on the left. BACK:  Without lesions, tenderness, or bony step-offs although he is significantly scoliotic in the lumbar spine. NEUROLOGIC:  GCS of 15, oriented without amnesia or focal deficits.  OBJECTIVE DATA:  LABS:  Sodium 133, potassium 4.2 chloride is 101, bicarb 23, BUN 23, creatinine 0.87, glucose 119.  His hemoglobin is 14.7, hematocrit is 40.4, white blood cell count 7.9, and platelets 195.  His PT/INR is 20.6/2.64.  X-RAYS:  Pelvic x-ray showed left superior and inferior pubic rami fractures.  CT of the abdomen and pelvis showed the same with no significant hematoma formation.  IMPRESSION AND PLAN: 1. Fall. 2. Left  superior and inferior pubic rami fractures. 3. Atrial fibrillation, on Coumadin. 4. Dyslipidemia. 5. Chronic obstructive pulmonary disease. 6. Hypertension. 7. Allergic rhinitis. 8. Gout.  PLAN:  We had physical therapy evaluate the patient who thought that he could safely go home in the care of his son with a walker.  He had his pain controlled on Percocet here in the hospital and so we will plan to discharge him home with followup with Dr. Carola Frost who was consulted by telephone in approximately 2 weeks.  He may call the Trauma Service if he has any questions or concerns, but followup with [place] will be on an as- needed basis.     Earney Hamburg, P.A.   ______________________________ Cherylynn Ridges, M.D.    MJ/MEDQ  D:  08/06/2010  T:  08/07/2010  Job:  [id number]  cc:   Titus Dubin. Alwyn Ren, MD,FACP,FCCP Doralee Albino. Carola Frost, M.D.  Electronically Signed by Charma Igo P.A. on 08/28/2010 03:46:35 PM Electronically Signed by Jimmye Norman M.D. on 09/17/2010 08:50:29 AM

## 2010-10-01 LAB — CBC
HCT: 44.1
Hemoglobin: 15.5
MCHC: 35
RDW: 12.1

## 2010-10-01 LAB — BASIC METABOLIC PANEL
CO2: 26
Calcium: 9.4
Glucose, Bld: 103 — ABNORMAL HIGH
Sodium: 138

## 2010-10-03 LAB — CBC
HCT: 42.5
MCV: 97.3
Platelets: 270
RDW: 11.8

## 2010-10-03 LAB — BASIC METABOLIC PANEL
BUN: 16
Chloride: 104
Glucose, Bld: 100 — ABNORMAL HIGH
Potassium: 4.3

## 2010-10-08 ENCOUNTER — Ambulatory Visit (INDEPENDENT_AMBULATORY_CARE_PROVIDER_SITE_OTHER): Payer: BC Managed Care – PPO | Admitting: *Deleted

## 2010-10-08 DIAGNOSIS — I4891 Unspecified atrial fibrillation: Secondary | ICD-10-CM

## 2010-10-25 ENCOUNTER — Other Ambulatory Visit: Payer: Self-pay

## 2010-10-25 MED ORDER — ATORVASTATIN CALCIUM 20 MG PO TABS
ORAL_TABLET | ORAL | Status: DC
Start: 2010-10-25 — End: 2011-02-07

## 2010-10-25 MED ORDER — METOPROLOL TARTRATE 50 MG PO TABS: 50.0000 mg | ORAL_TABLET | ORAL | Status: DC

## 2010-10-25 MED ORDER — ALLOPURINOL 300 MG PO TABS: ORAL_TABLET | ORAL | Status: DC

## 2010-11-05 ENCOUNTER — Ambulatory Visit (INDEPENDENT_AMBULATORY_CARE_PROVIDER_SITE_OTHER): Payer: BC Managed Care – PPO | Admitting: *Deleted

## 2010-11-05 DIAGNOSIS — I4891 Unspecified atrial fibrillation: Secondary | ICD-10-CM

## 2010-12-03 ENCOUNTER — Ambulatory Visit (INDEPENDENT_AMBULATORY_CARE_PROVIDER_SITE_OTHER): Payer: BC Managed Care – PPO | Admitting: *Deleted

## 2010-12-03 DIAGNOSIS — I4891 Unspecified atrial fibrillation: Secondary | ICD-10-CM

## 2010-12-20 ENCOUNTER — Telehealth: Payer: Self-pay | Admitting: Internal Medicine

## 2010-12-20 ENCOUNTER — Encounter: Payer: Self-pay | Admitting: Internal Medicine

## 2010-12-20 ENCOUNTER — Ambulatory Visit (INDEPENDENT_AMBULATORY_CARE_PROVIDER_SITE_OTHER): Payer: BC Managed Care – PPO | Admitting: Internal Medicine

## 2010-12-20 DIAGNOSIS — H9319 Tinnitus, unspecified ear: Secondary | ICD-10-CM

## 2010-12-20 DIAGNOSIS — I4891 Unspecified atrial fibrillation: Secondary | ICD-10-CM

## 2010-12-20 DIAGNOSIS — R42 Dizziness and giddiness: Secondary | ICD-10-CM

## 2010-12-20 NOTE — Patient Instructions (Signed)
Please  schedule fasting Labs : BMET,Lipids, hepatic panel, CBC & dif, TSH. PLEASE BRING THESE INSTRUCTIONS TO FOLLOW UP  LAB APPOINTMENT.This will guarantee correct labs are drawn, eliminating need for repeat blood sampling ( needle sticks ! ). Diagnoses /Codes: vertigo, AF, 401.9, 272.4 , 995.20

## 2010-12-20 NOTE — Telephone Encounter (Signed)
Per Dr. Alwyn Ren: Please contact patient and have him come in at 4:30  with all his medications to discuss with MD

## 2010-12-20 NOTE — Assessment & Plan Note (Signed)
Clinically his atrial fib as well controlled as to rate. He denies any episodes of tachyarrhythmia associated with the vertigo.

## 2010-12-20 NOTE — Progress Notes (Signed)
  Subjective:    Patient ID: Reginald Green, male    DOB: 05/04/1945, 65 y.o.   MRN: [id number]  HPI Dizziness Onset:10/07/2010 Context: after coughing while driving Character: frank vertigo  Course: 3 recurrences since; all episodes have occurred while seated except for the last when he had just lay back in bed. He does not describe significant benign positional vertigo symptoms. Duration:2-3 minutes Associated triggers or predispositions: Position change:no Benign positional vertigo symptoms:minimally Straining:no Pain:no Cardiac prodrome:No palpitations, irregular rhythm, heart rate change. He has A fib Neurologic prodrome: No headache, numbness and tingling, weakness, change in coordination (gait/falling) Syncope:no Seizure activity:no Upper respiratory tract infection/extrinsic symptoms:no  Associated signs and symptoms: Visual change (blurred/double/loss):no Hearing loss/tinnitus:tinnitus has been chronic issue Nausea/sweating:yes Chest pain:no Dyspnea:no Treatment/response:no treatment.  There is no past medical history family history of vertigo.      Review of Systems     Objective:   Physical Exam  Gen. appearance: Well-nourished, in no distress Eyes: Extraocular motion intact, field of vision normal, vision grossly intact, no nystagmus ENT: Canals clear, tympanic membranes normal, tuning fork exam normal, hearing grossly normal Neck: Normal range of motion, no masses, normal thyroid Cardiovascular: Slow , irregular rhythm; no murmurs, gallops or extra heart sounds Muscle skeletal: Joints normal; tone &  strength normal Neuro:no cranial nerve deficit, deep tendon  reflexes normal, gait normal; Romberg and finger nose are normal,but he has  a very minor tremor with these test. Lymph: No cervical or axillary LA Skin: Warm and dry without suspicious lesions or rashes Psych: no anxiety or mood change. Normally interactive and cooperative.         Assessment &  Plan:  #1 vertigo, self-limited. 4 episodes since September, 3 while seated. Concern is occurence while driving. Neurology consultation will be pursued.  #2 tendinitis chronic

## 2010-12-21 ENCOUNTER — Encounter: Payer: Self-pay | Admitting: Neurology

## 2010-12-21 ENCOUNTER — Other Ambulatory Visit (INDEPENDENT_AMBULATORY_CARE_PROVIDER_SITE_OTHER): Payer: BC Managed Care – PPO

## 2010-12-21 DIAGNOSIS — I10 Essential (primary) hypertension: Secondary | ICD-10-CM

## 2010-12-21 DIAGNOSIS — E785 Hyperlipidemia, unspecified: Secondary | ICD-10-CM

## 2010-12-21 LAB — BASIC METABOLIC PANEL
CO2: 29 mEq/L (ref 19–32)
GFR: 80.95 mL/min (ref >60.00)
Glucose, Bld: 92 mg/dL (ref 70–99)
Potassium: 4 mEq/L (ref 3.5–5.1)
Sodium: 138 mEq/L (ref 135–145)

## 2010-12-21 LAB — HEPATIC FUNCTION PANEL
ALT: 24 U/L (ref 0–53)
AST: 27 U/L (ref 0–37)
Alkaline Phosphatase: 68 U/L (ref 39–117)
Total Bilirubin: 1 mg/dL (ref 0.3–1.2)

## 2010-12-21 LAB — LIPID PANEL
Cholesterol: 150 mg/dL (ref 0–200)
HDL: 61.8 mg/dL (ref >39.00)
LDL Cholesterol: 75 mg/dL (ref 0–99)
Total CHOL/HDL Ratio: 2
Triglycerides: 68 mg/dL (ref 0.0–149.0)
VLDL: 13.6 mg/dL (ref 0.0–40.0)

## 2010-12-21 LAB — CBC WITH DIFFERENTIAL/PLATELET
Basophils Absolute: 0 10*3/uL (ref 0.0–0.1)
Eosinophils Absolute: 0.1 10*3/uL (ref 0.0–0.7)
Eosinophils Relative: 1.1 % (ref 0.0–5.0)
MCHC: 34.5 g/dL (ref 30.0–36.0)
MCV: 101.7 fl — ABNORMAL HIGH (ref 78.0–100.0)
Monocytes Absolute: 0.8 10*3/uL (ref 0.1–1.0)
Neutrophils Relative %: 66.3 % (ref 43.0–77.0)
Platelets: 221 10*3/uL (ref 150.0–400.0)
RDW: 12.5 % (ref 11.5–14.6)
WBC: 8 10*3/uL (ref 4.5–10.5)

## 2010-12-21 LAB — TSH: TSH: 1.57 u[IU]/mL (ref 0.35–5.50)

## 2010-12-24 ENCOUNTER — Telehealth: Payer: Self-pay | Admitting: Internal Medicine

## 2010-12-24 NOTE — Telephone Encounter (Signed)
Patient received copy of med list he said lisinopril 40mg  1/2 daily was not on the list

## 2010-12-24 NOTE — Telephone Encounter (Signed)
Med list are always undated at OV, meds not added if patient unaware of med name or dose. Med list is now updated

## 2011-01-07 ENCOUNTER — Ambulatory Visit (INDEPENDENT_AMBULATORY_CARE_PROVIDER_SITE_OTHER): Payer: BC Managed Care – PPO | Admitting: *Deleted

## 2011-01-07 DIAGNOSIS — I4891 Unspecified atrial fibrillation: Secondary | ICD-10-CM

## 2011-01-07 LAB — POCT INR: INR: 2.6

## 2011-01-10 ENCOUNTER — Emergency Department (HOSPITAL_COMMUNITY): Payer: BC Managed Care – PPO

## 2011-01-10 ENCOUNTER — Other Ambulatory Visit: Payer: Self-pay

## 2011-01-10 ENCOUNTER — Emergency Department (HOSPITAL_COMMUNITY)
Admission: EM | Admit: 2011-01-10 | Discharge: 2011-01-10 | Disposition: A | Discharge status: Home / Self Care | Payer: BC Managed Care – PPO | Attending: *Deleted | Admitting: *Deleted

## 2011-01-10 ENCOUNTER — Encounter (HOSPITAL_COMMUNITY): Payer: Self-pay | Admitting: Emergency Medicine

## 2011-01-10 DIAGNOSIS — R42 Dizziness and giddiness: Secondary | ICD-10-CM | POA: Insufficient documentation

## 2011-01-10 DIAGNOSIS — R079 Chest pain, unspecified: Secondary | ICD-10-CM | POA: Insufficient documentation

## 2011-01-10 DIAGNOSIS — I10 Essential (primary) hypertension: Secondary | ICD-10-CM | POA: Insufficient documentation

## 2011-01-10 DIAGNOSIS — I4891 Unspecified atrial fibrillation: Secondary | ICD-10-CM | POA: Insufficient documentation

## 2011-01-10 DIAGNOSIS — Z7901 Long term (current) use of anticoagulants: Secondary | ICD-10-CM | POA: Insufficient documentation

## 2011-01-10 HISTORY — DX: Chronic obstructive pulmonary disease, unspecified: J44.9

## 2011-01-10 HISTORY — DX: Essential (primary) hypertension: I10

## 2011-01-10 HISTORY — DX: Chronic atrial fibrillation, unspecified: I48.20

## 2011-01-10 HISTORY — DX: Gilbert syndrome: E80.4

## 2011-01-10 HISTORY — DX: Hyperlipidemia, unspecified: E78.5

## 2011-01-10 LAB — CBC
HCT: 39.8 % (ref 39.0–52.0)
MCHC: 35.4 g/dL (ref 30.0–36.0)
Platelets: 190 10*3/uL (ref 150–400)
RDW: 12.3 % (ref 11.5–15.5)
WBC: 6.4 10*3/uL (ref 4.0–10.5)

## 2011-01-10 LAB — PROTIME-INR
INR: 2.14 — ABNORMAL HIGH (ref 0.00–1.49)
Prothrombin Time: 24.3 seconds — ABNORMAL HIGH (ref 11.6–15.2)

## 2011-01-10 LAB — DIFFERENTIAL
Basophils Absolute: 0 10*3/uL (ref 0.0–0.1)
Basophils Relative: 1 % (ref 0–1)
Lymphocytes Relative: 25 % (ref 12–46)
Monocytes Absolute: 0.8 10*3/uL (ref 0.1–1.0)
Neutro Abs: 3.9 10*3/uL (ref 1.7–7.7)
Neutrophils Relative %: 62 % (ref 43–77)

## 2011-01-10 LAB — POCT I-STAT, CHEM 8
BUN: 16 mg/dL (ref 6–23)
Calcium, Ion: 1.13 mmol/L (ref 1.12–1.32)
Chloride: 107 mEq/L (ref 96–112)
HCT: 43 % (ref 39.0–52.0)
Potassium: 5.4 mEq/L — ABNORMAL HIGH (ref 3.5–5.1)
Sodium: 137 mEq/L (ref 135–145)

## 2011-01-10 LAB — CARDIAC PANEL(CRET KIN+CKTOT+MB+TROPI): Troponin I: <0.3 ng/mL (ref <0.30)

## 2011-01-10 LAB — POCT I-STAT TROPONIN I: Troponin i, poc: 0 ng/mL (ref 0.00–0.08)

## 2011-01-10 NOTE — ED Notes (Signed)
Rainbow drawn 1 blue, 1 green, 1 light green, 1 dark green sent to lab

## 2011-01-10 NOTE — ED Notes (Signed)
Pt alert, nad, c/o vertigo, onset last pm, states started while lying down, unable to sleep, this am began feeling chest pain, denies n/v, resp even unlabored, skin pwd

## 2011-01-10 NOTE — ED Notes (Signed)
Informed md that the patient is now in afib. Patient has no hx of afib .  Patient denies pain at this time in his chest.

## 2011-01-10 NOTE — ED Provider Notes (Addendum)
History     CSN: [id number]  Arrival date & time 01/10/11  0542   First MD Initiated Contact with Patient 01/10/11 [phone]      Chief Complaint  Patient presents with  . Dizziness  . Chest Pain    (Consider location/radiation/quality/duration/timing/severity/associated sxs/prior treatment) Patient is a 67 y.o. male presenting with chest pain. The history is provided by the patient. No language interpreter was used.  Chest Pain The chest pain began 3 - 5 hours ago. Duration of episode(s) is 10 minutes. Chest pain occurs intermittently. The chest pain is resolved. At its most intense, the pain is at 8/10. The pain is currently at 0/10. The severity of the pain is moderate. The quality of the pain is described as pressure-like. The pain does not radiate. Primary symptoms include nausea and dizziness. Pertinent negatives for primary symptoms include no fever, no shortness of breath, no cough, no palpitations, no vomiting and no altered mental status.  Dizziness also occurs with nausea. Dizziness does not occur with vomiting, weakness or diaphoresis.  Pertinent negatives for associated symptoms include no diaphoresis, no lower extremity edema, no near-syncope, no numbness, no paroxysmal nocturnal dyspnea and no weakness. He tried nothing for the symptoms. Risk factors include obesity.  His past medical history is significant for arrhythmia, COPD, hyperlipidemia and recent injury.  Pertinent negatives for past medical history include no aneurysm, no anxiety/panic attacks, no aortic aneurysm, no aortic dissection, no cancer, no congenital heart disease, no connective tissue disease, no CHF, no diabetes, no DVT, no MI, no mitral valve prolapse, no pacemaker, no PE, no rheumatic fever, no seizures, no sickle cell disease, no sleep apnea, no stimulant use, no strokes, no thyroid problem and no TIA.  His family medical history is significant for CAD in family, diabetes in family, heart disease in family,  hyperlipidemia in family, hypertension in family, early MI in family, stroke in family and TIA in family.  Pertinent negatives for family medical history include: family history of aortic dissection, no connective tissue disease in family, no PE in family, no sickle cell disease in family and no sudden death in family.  Procedure history is positive for echocardiogram, persantine thallium, stress echo, stress thallium and exercise treadmill test.  Procedure history is negative for cardiac catheterization.     Past Medical History  Diagnosis Date  . Hypertension   . COPD (chronic obstructive pulmonary disease)   . Gout   . A-fib     History reviewed. No pertinent past surgical history.  No family history on file.  History  Substance Use Topics  . Smoking status: Former Games developer  . Smokeless tobacco: Not on file   Comment: stopped 23 years ago as of 2012  . Alcohol Use: Yes      Review of Systems  Constitutional: Negative.  Negative for fever and diaphoresis.  HENT: Negative.   Eyes: Negative.   Respiratory: Negative for cough and shortness of breath.   Cardiovascular: Positive for chest pain. Negative for palpitations, leg swelling and near-syncope.  Gastrointestinal: Positive for nausea. Negative for vomiting.  Musculoskeletal: Negative for back pain.  Skin: Negative.   Neurological: Positive for dizziness. Negative for seizures, weakness, numbness and headaches.  Psychiatric/Behavioral: Negative.  Negative for altered mental status.    Allergies  Review of patient's allergies indicates no known allergies.  Home Medications   Current Outpatient Rx  Name Route Sig Dispense Refill  . ALLOPURINOL 300 MG PO TABS  1/2 by mouth daily 90 tablet 0  .  ASPIRIN 81 MG PO TABS Oral Take 81 mg by mouth daily.      . ATORVASTATIN CALCIUM 20 MG PO TABS  1 tablet mon ,wed, fri, sunday 90 tablet 0  . FLUTICASONE PROPIONATE 50 MCG/ACT NA SUSP Nasal Place 1 spray into the nose 2 (two)  times daily.      Marland Kitchen FLUTICASONE-SALMETEROL 250-50 MCG/DOSE IN AEPB Inhalation Inhale 1 puff into the lungs every 12 (twelve) hours.      Marland Kitchen LISINOPRIL 40 MG PO TABS Oral Take 40 mg by mouth. 1/2 by mouth daily     . METOPROLOL TARTRATE 50 MG PO TABS Oral Take 1 tablet (50 mg total) by mouth as directed. 1/2 by mouth twice daily 90 tablet 0  . ONE-DAILY MULTI VITAMINS PO TABS Oral Take 1 tablet by mouth daily.      . WARFARIN SODIUM 5 MG PO TABS Oral Take 6.5 mg by mouth daily. Takes 1 1/2 tablets on Saturday.,all other days one tablet daily       BP 130/85  Pulse 78  Temp(Src) 97.9 F (36.6 C) (Oral)  Resp 15  Wt 225 lb (102.059 kg)  SpO2 97%  Physical Exam  Nursing note and vitals reviewed. Constitutional: He is oriented to person, place, and time. He appears well-developed and well-nourished.  HENT:  Head: Normocephalic.  Eyes: Conjunctivae and EOM are normal. Pupils are equal, round, and reactive to light.  Neck: Normal range of motion. Neck supple.  Cardiovascular: Normal pulses.  An irregular rhythm present. Exam reveals no gallop and no friction rub.   No murmur heard.      AFL  Pulmonary/Chest: Effort normal and breath sounds normal.  Abdominal: Soft. Bowel sounds are normal. He exhibits no distension.  Musculoskeletal: Normal range of motion.  Neurological: He is alert and oriented to person, place, and time.  Skin: Skin is warm and dry.  Psychiatric: He has a normal mood and affect.    ED Course  Procedures (including critical care time)  Labs Reviewed  CBC - Abnormal; Notable for the following:    RBC 4.20 (*)    All other components within normal limits  POCT I-STAT, CHEM 8 - Abnormal; Notable for the following:    Potassium 5.4 (*)    All other components within normal limits  DIFFERENTIAL  POCT I-STAT TROPONIN I  I-STAT TROPONIN I  I-STAT, CHEM 8  POTASSIUM  PROTIME-INR   Dg Chest 2 View  01/10/2011  *RADIOLOGY REPORT*  Clinical Data: Dizziness and chest  discomfort.  CHEST - 2 VIEW  Comparison: 08/06/2010  Findings: Elevation of the left hemidiaphragm, chronic.  Borderline heart size and pulmonary vascularity.  Early congestive change is not excluded.  No pulmonary edema.  No focal airspace consolidation.  No blunting of costophrenic angles.  No pneumothorax.  Tortuous aorta.  Degenerative changes in the spine and shoulders.  IMPRESSION: Chronic elevation of the left hemidiaphragm.  Borderline heart size and pulmonary vascularity may suggest developing vascular congestion.  No active consolidation.  Original Report Authenticated By: Marlon Pel, M.D.     1. Chest pain   2. Vertigo       MDM  Patient seen in the ER today for chest pressure pressure x30 minutes around 2 AM when he got up to go to the bathroom. Patient also experienced vertigo no that lasted 15 minutes x2. Past medical history of vertigo and is being worked up with Dr. Alwyn Ren. Scheduled to see a neurologist at the end  of the month. Dr. Sherlyn Lick will see him in the ER today concerning his chest pressure and may or may not admit him. Initial troponin is negative EKG is atrial flutter controlled rate. He has been seen by our cardiology in the past for his atrial flutter and has been cardioverted x2. Potassium was 5.4 and we will repeat that. Chest x-ray shows left hemidiaphragm elevation that is chronic and pulmonary vascular congestion. Abnormal labs as follows   Labs Reviewed  CBC - Abnormal; Notable for the following:    RBC 4.20 (*)    All other components within normal limits  POCT I-STAT, CHEM 8 - Abnormal; Notable for the following:    Potassium 5.4 (*)    All other components within normal limits  DIFFERENTIAL  POCT I-STAT TROPONIN I  I-STAT TROPONIN I  I-STAT, CHEM 8  POTASSIUM  PROTIME-INR         Jethro Bastos, NP 01/10/11 0818 1:30  Patient was seen by [facility] in the ER today.  3 negative troponins today.  Will follow up with multiple  appointments per [facility].  Return to ER for severe pain or SOB.  Jethro Bastos, NP 01/10/11 1336  Jethro Bastos, NP 04/09/11 1123

## 2011-01-10 NOTE — H&P (Signed)
History and Physical  Patient ID: Reginald Green MRN: [id number], SOB: 1945-10-24 66 y.o. Date of Encounter: 01/10/2011, 9:31 AM  Primary Physician: Reginald Melnick, MD, MD Primary Cardiologist: Dr. Eden Green  Chief Complaint: dizziness, chest discomfort  HPI: Reginald Green is a 66 y/o M with a hx of chronic atrial fib on anticoag & normal LV function in 2009 who presents to [facility] c/o chest discomfort and episodic dizziness. Last night, he had a rough night with intermittent left-sided chest pressure worse when lying down, better upon sitting. He denies overt SOB but states he felt "congested." He had mild nausea, but no vomiting or diaphoresis. He was able to doze off overnight but when he woke had increasing chest tightness so came to the ER. He is active walking 2 miles 3x/week and denies any CP on exertion but does have some dyspnea which is unchanged.  He also describes intermittent episodes of dizziness lasting 30 seconds at a time. They have sometimes been positional such as the episodes he had last night while rolling from his back to his side, but sometimes occur without warning or position changes. He sees the room spinning and is unable to focus. No palpitations, chest pain, dyspnea, presyncope or syncope. Sometimes these episodes are preceded by a "heavy" sensation in his head. Once he had an episode while driving that required him to pull over to be able to get his bearings. The episodes resolve spontaneously without any obvious relieving factors. He denies any hearing loss or ear pain.  EKG shows atrial fib with a V-rate of 54-102. QTc is slightly prolonged at 492. Initial Istat BMET showed K of 5.4, regular potassium lab was repeated at 3.9. On telemetry he has afib (rate controlled) with short NSVT x 1. BP is stable. POC troponin is neg.  Past Medical History  Diagnosis Date  . Hypertension   . COPD (chronic obstructive pulmonary disease)   . Gout   . Chronic atrial fibrillation    Multiple failed cardioversions (even on flecanide). Normal LV function by echo in 2009 with LA 45mm, reported nonischemic myoview at that time  . Hyperlipidemia   . Gilbert's disease     2D echo 12/19/2007 SUMMARY: - Overall left ventricular systolic function was normal. Left ventricular ejection fraction was estimated to be 60 %. There were no left ventricular regional wall motion abnormalities. - Normal mitral valve - The left atrium was dilated (45mm)  Surgical History:  Past Surgical History  Procedure Date  . Hernia repair     Undescended testicle brought down at this time in 1979  . Appendectomy     At time of hernia repair in 1979  . Tonsillectomy      Home Meds: Medication Sig  allopurinol (ZYLOPRIM) 300 MG tablet 1/2 by mouth daily  aspirin 81 MG tablet Take 81 mg by mouth daily.    atorvastatin (LIPITOR) 20 MG tablet 1 tablet mon ,wed, fri, sunday  fluticasone (FLONASE) 50 MCG/ACT nasal spray Place 1 spray into the nose 2 (two) times daily.    Fluticasone-Salmeterol (ADVAIR DISKUS) 250-50 MCG/DOSE AEPB Inhale 1 puff into the lungs every 12 (twelve) hours.    lisinopril (PRINIVIL,ZESTRIL) 40 MG tablet Take 40 mg by mouth. 1/2 by mouth daily   metoprolol (LOPRESSOR) 50 MG tablet Take 1 tablet (50 mg total) by mouth as directed. 1/2 by mouth twice daily  Multiple Vitamin (MULTIVITAMIN) tablet Take 1 tablet by mouth daily.    warfarin (COUMADIN) 5 MG tablet Take  5mg  daily except 7.5mg  on Saturday as of 01/07/11 coumadin check    Allergies: No Known Allergies  History   Social History  . Marital Status: Married    Spouse Name: N/A    Number of Children: N/A  . Years of Education: N/A   Occupational History  . Not on file.   Social History Main Topics  . Smoking status: Former Games developer  . Smokeless tobacco: Not on file   Comment: stopped 23 years ago as of 2012  . Alcohol Use: Yes  . Drug Use: No  . Sexually Active: Not on file   Other Topics Concern  . Not on  file   Social History Narrative  . No narrative on file     Family History  Problem Relation Age of Onset  . Stroke Mother   . Hypertension Mother   . Coronary artery disease Maternal Uncle     Review of Systems: General: negative for chills, fever, night sweats or weight changes.  Cardiovascular: negative for edema, orthopnea, palpitations, paroxysmal nocturnal dyspnea. See above Dermatological: negative for rash Respiratory: negative for cough or wheezing Urologic: negative for hematuria Abdominal: negative for vomiting, diarrhea, bright red blood per rectum, melena, or hematemesis Neurologic: see above. No overt syncope All other systems reviewed and are otherwise negative except as noted above.  Labs:   Lab Results  Component Value Date   WBC 6.4 01/10/2011   HGB 14.6 01/10/2011   HCT 43.0 01/10/2011   MCV 94.8 01/10/2011   PLT 190 01/10/2011    Lab 01/10/11 0805 01/10/11 0635  NA -- 137  K 3.9 --  CL -- 107  CO2 -- --  BUN -- 16  CREATININE -- 0.90  CALCIUM -- --  PROT -- --  BILITOT -- --  ALKPHOS -- --  ALT -- --  AST -- --  GLUCOSE -- 94    Lab Results  Component Value Date   CHOL 150 12/21/2010   HDL 61.80 12/21/2010   LDLCALC 75 12/21/2010   TRIG 68.0 12/21/2010   Radiology/Studies:  1. Chest 2 View 01/10/2011  *RADIOLOGY REPORT*  Clinical Data: Dizziness and chest discomfort.  CHEST - 2 VIEW  Comparison: 08/06/2010  Findings: Elevation of the left hemidiaphragm, chronic.  Borderline heart size and pulmonary vascularity.  Early congestive change is not excluded.  No pulmonary edema.  No focal airspace consolidation.  No blunting of costophrenic angles.  No pneumothorax.  Tortuous aorta.  Degenerative changes in the spine and shoulders.  IMPRESSION: Chronic elevation of the left hemidiaphragm.  Borderline heart size and pulmonary vascularity may suggest developing vascular congestion.  No active consolidation.     EKG: atrial fib v-rate 54-102 without acute  changes. QTC is slightly prolonged at 492.  Physical Exam: Blood pressure 130/85, pulse 78, temperature 97.9 F (36.6 C), temperature source Oral, resp. rate 15, weight 225 lb (102.059 kg), SpO2 97.00%. General: Well developed, well nourished, well-appearing in no acute distress. Head: Normocephalic, atraumatic, sclera non-icteric, no xanthomas, nares are without discharge.  Neck: Negative for carotid bruits. JVD not elevated. Lungs: Clear bilaterally to auscultation without wheezes, rales, or rhonchi. Breathing is unlabored. Heart: Irregularly irregular rhythm but normal rate with S1 S2. No murmurs, rubs, or gallops appreciated. Abdomen: Soft, non-tender, non-distended with normoactive bowel sounds. No hepatomegaly. No rebound/guarding. No obvious abdominal masses. Msk:  Strength and tone appear normal for age. Extremities: No clubbing or cyanosis. No edema.  Distal pedal pulses are 2+ and equal bilaterally. Neuro:  Alert and oriented X 3. Moves all extremities spontaneously. Psych:  Responds to questions appropriately with a normal affect.     ASSESSMENT AND PLAN:   1. Chest pain - negative POC & EKG nonischemic, however with brief NSVT (only 3 beats) on telemetry would consider ischemic eval. Will discuss with MD. Check 2D echo given borderline heart size & pulmonary vascularity on CXR.  2. Dizziness - Some of his story sounds like vertigo but other insidious episodes with preceding headache concern me in light of telemetry findings that something arrhythmic may be going on.  3. Atrial fib - INR therapeutic, currently rate controlled. Watch on tele for more significant pauses.  Signed, Ronie Spies PA-C 01/10/2011, 9:31 AM  I have seen, examined the patient, and reviewed the above assessment and plan.  Briefly, patient is a 66 yo WM with a h/o persistent afib and vertigo.  He presents with chest pain that occurred earlier this am, now resolved.  He describes "chest discomfort" which  improved upon sitting up.  No recurrences. No exertional symptoms previously.  Denies SOB.  He has vertiginous spinning chronically for which he has been evaluated by Dr Alwyn Ren. Benign exam, with normal neuro exam.  CTAB, iRRR. Troponin is normal with no ischemic changes on ekg. Will obtain second set of cardiac markers.  If normal, will discharge to home with outpatient echo, lexiscan myoview, and event monitor.  If abnormal cardiac markers, will admit for inpatient evaluation.  Will need close outpatient follow-up with Dr Reginald Green.  Co Sign: Hillis Range, MD 01/10/2011 11:56 AM

## 2011-01-10 NOTE — ED Notes (Signed)
[place] pa at bedside

## 2011-01-10 NOTE — ED Provider Notes (Signed)
7:25 AM  Date: 01/10/2011  Rate:65  Rhythm: atrial fibrillation  QRS Axis: normal  Intervals: normal  ST/T Wave abnormalities: normal  Conduction Disutrbances:none  Narrative Interpretation: Abnormal EKG.  Old EKG Reviewed: none available     Carleene Cooper III, MD 01/10/11 [phone]

## 2011-01-10 NOTE — Progress Notes (Signed)
We have placed order for stat enzymes. If negative, the patient can be discharged home.  He will have follow-up as below at [facility]: - 2D echo 01/15/11 at 1pm - Lexiscan Myoview stress test 01/17/11 at 9:30 am - Have appointment with Tereso Newcomer PA-C 01/29/11 at 10am - Our office will set up a time to come pick up event monitor at our office - We have placed a prescription for SL NTG 0.4mg  q52min x 3 prn in his chart for him to go home with - Otherwise continue home meds  Dayna Dunn PA-C

## 2011-01-11 ENCOUNTER — Other Ambulatory Visit (HOSPITAL_COMMUNITY): Payer: Self-pay | Admitting: Internal Medicine

## 2011-01-11 ENCOUNTER — Encounter (INDEPENDENT_AMBULATORY_CARE_PROVIDER_SITE_OTHER): Payer: BC Managed Care – PPO

## 2011-01-11 DIAGNOSIS — R42 Dizziness and giddiness: Secondary | ICD-10-CM

## 2011-01-11 NOTE — ED Provider Notes (Signed)
Medical screening examination/treatment/procedure(s) were performed by non-physician practitioner and as supervising physician I was immediately available for consultation/collaboration.  Aurel Nguyen Smitty Cords, MD 01/11/11 Earle Gell

## 2011-01-14 ENCOUNTER — Other Ambulatory Visit (HOSPITAL_COMMUNITY): Payer: Self-pay | Admitting: Internal Medicine

## 2011-01-14 DIAGNOSIS — R079 Chest pain, unspecified: Secondary | ICD-10-CM

## 2011-01-15 ENCOUNTER — Ambulatory Visit (HOSPITAL_COMMUNITY): Payer: BC Managed Care – PPO | Attending: Cardiology

## 2011-01-15 DIAGNOSIS — R079 Chest pain, unspecified: Secondary | ICD-10-CM | POA: Insufficient documentation

## 2011-01-15 DIAGNOSIS — J4489 Other specified chronic obstructive pulmonary disease: Secondary | ICD-10-CM | POA: Insufficient documentation

## 2011-01-15 DIAGNOSIS — R072 Precordial pain: Secondary | ICD-10-CM

## 2011-01-15 DIAGNOSIS — E785 Hyperlipidemia, unspecified: Secondary | ICD-10-CM | POA: Insufficient documentation

## 2011-01-15 DIAGNOSIS — J449 Chronic obstructive pulmonary disease, unspecified: Secondary | ICD-10-CM | POA: Insufficient documentation

## 2011-01-15 DIAGNOSIS — I4891 Unspecified atrial fibrillation: Secondary | ICD-10-CM

## 2011-01-15 DIAGNOSIS — R42 Dizziness and giddiness: Secondary | ICD-10-CM | POA: Insufficient documentation

## 2011-01-17 ENCOUNTER — Ambulatory Visit (HOSPITAL_COMMUNITY): Payer: BC Managed Care – PPO | Attending: Cardiology | Admitting: Radiology

## 2011-01-17 VITALS — BP 86/66 | Ht 76.0 in | Wt 220.0 lb

## 2011-01-17 DIAGNOSIS — J449 Chronic obstructive pulmonary disease, unspecified: Secondary | ICD-10-CM | POA: Insufficient documentation

## 2011-01-17 DIAGNOSIS — R110 Nausea: Secondary | ICD-10-CM | POA: Insufficient documentation

## 2011-01-17 DIAGNOSIS — E785 Hyperlipidemia, unspecified: Secondary | ICD-10-CM | POA: Insufficient documentation

## 2011-01-17 DIAGNOSIS — I472 Ventricular tachycardia, unspecified: Secondary | ICD-10-CM | POA: Insufficient documentation

## 2011-01-17 DIAGNOSIS — R0789 Other chest pain: Secondary | ICD-10-CM | POA: Insufficient documentation

## 2011-01-17 DIAGNOSIS — I10 Essential (primary) hypertension: Secondary | ICD-10-CM | POA: Insufficient documentation

## 2011-01-17 DIAGNOSIS — Z8249 Family history of ischemic heart disease and other diseases of the circulatory system: Secondary | ICD-10-CM | POA: Insufficient documentation

## 2011-01-17 DIAGNOSIS — R079 Chest pain, unspecified: Secondary | ICD-10-CM

## 2011-01-17 DIAGNOSIS — R42 Dizziness and giddiness: Secondary | ICD-10-CM | POA: Insufficient documentation

## 2011-01-17 DIAGNOSIS — I4729 Other ventricular tachycardia: Secondary | ICD-10-CM | POA: Insufficient documentation

## 2011-01-17 DIAGNOSIS — J4489 Other specified chronic obstructive pulmonary disease: Secondary | ICD-10-CM | POA: Insufficient documentation

## 2011-01-17 DIAGNOSIS — I4949 Other premature depolarization: Secondary | ICD-10-CM

## 2011-01-17 DIAGNOSIS — Z87891 Personal history of nicotine dependence: Secondary | ICD-10-CM | POA: Insufficient documentation

## 2011-01-17 MED ORDER — TECHNETIUM TC 99M TETROFOSMIN IV KIT
33.0000 | PACK | Freq: Once | INTRAVENOUS | Status: AC | PRN
  Administered 2011-01-17: 33 via INTRAVENOUS

## 2011-01-17 MED ORDER — REGADENOSON 0.4 MG/5ML IV SOLN
0.4000 mg | Freq: Once | INTRAVENOUS | Status: AC
  Administered 2011-01-17: 0.4 mg via INTRAVENOUS

## 2011-01-17 MED ORDER — TECHNETIUM TC 99M TETROFOSMIN IV KIT
11.0000 | PACK | Freq: Once | INTRAVENOUS | Status: AC | PRN
  Administered 2011-01-17: 11 via INTRAVENOUS

## 2011-01-17 NOTE — Progress Notes (Signed)
[facility] [address] NUCLEAR MED [address] [place] [address] [phone]  Cardiology Nuclear Med Reginald Green is a 66 y.o. male [id number] 1945/06/01   Nuclear Med Background Indication for Stress Test:  Evaluation for Ischemia and 01/10/11 [facility]: CP, dizziness, nausea, and brief 3 beat VT on telemetry History:  Asthma, Cardioversion: failed x2, COPD, '09 Echo: EF: 60%, 04/09 GXT:no proarrhythmia on flecainide.  and 02/09 Myocardial Perfusion Study: (-) ischemia EF: 65% Cardiac Risk Factors: Family History - CAD, History of Smoking, Hypertension and Lipids  Symptoms:  Chest Pain, Chest Tightness, Dizziness, Nausea, Palpitations, Rapid HR and SOB   Nuclear Pre-Procedure Caffeine/Decaff Intake:  None NPO After: 8:00pm   Lungs:  clear IV 0.9% NS with Angio Cath:  20g  IV Site: R Antecubital x 1, tolerated well IV Started by:  Irean Hong, RN  Chest Size (in):  46 Cup Size: n/a  Height: 6\' 4"  (1.93 m)  Weight:  220 lb (99.791 kg)  BMI:  Body mass index is 26.78 kg/(m^2). Tech Comments:  Held metoprolol this am    Nuclear Med Study 1 or 2 day study: 1 day  Stress Test Type:  Lexiscan  Reading MD: Cassell Clement, MD  Order Authorizing Provider:  Hillis Range, MD, and Tereso Newcomer, [facility]  Resting Radionuclide: Technetium 84m Tetrofosmin  Resting Radionuclide Dose: 11 mCi   Stress Radionuclide:  Technetium 21m Tetrofosmin  Stress Radionuclide Dose: 33 mCi           Stress Protocol Rest HR: 81 Stress HR: 109  Rest BP: 86/66 Stress BP: 109/81  Exercise Time (min): n/a METS: n/a   Predicted Max HR: 157 bpm % Max HR: 61.78 bpm Rate Pressure Product: 21308   Dose of Adenosine (mg):  n/a Dose of Lexiscan: 0.4 mg  Dose of Atropine (mg): n/a Dose of Dobutamine: n/a mcg/kg/min (at max HR)  Stress Test Technologist: Milana Na, EMT-P  Nuclear Technologist:  Domenic Polite, CNMT     Rest Procedure:  Myocardial perfusion imaging  was performed at rest 45 minutes following the intravenous administration of Technetium 41m Tetrofosmin. Rest ECG: Atrial Fibrilliation  Stress Procedure:  The patient received IV Lexiscan 0.4 mg over 15-seconds.  Technetium 69m Tetrofosmin injected at 30-seconds.  There were no significant changes, sob, chest pressure, and rare pvcs with Lexiscan.  Quantitative spect images were obtained after a 45 minute delay. Stress ECG: No significant change from baseline ECG  QPS Raw Data Images:  Normal; no motion artifact; normal heart/lung ratio. Stress Images:  Normal homogeneous uptake in all areas of the myocardium. Rest Images:  Normal homogeneous uptake in all areas of the myocardium. Subtraction (SDS):  No evidence of ischemia. Transient Ischemic Dilatation (Normal <1.22):  1.04 Lung/Heart Ratio (Normal <0.45): .35  Quantitative Gated Spect Images QGS EDV:  n/a ml QGS ESV:  n/a ml QGS cine images:  n/a QGS EF: n/a  Impression Exercise Capacity:  Lexiscan with no exercise. BP Response:  Normal blood pressure response. Clinical Symptoms:  Mild chest pain/dyspnea. ECG Impression:  No significant ST segment change suggestive of ischemia. Comparison with Prior Nuclear Study: No significant change from previous study  Overall Impression:  Low risk stress nuclear study. Not gated because of atrial fibrillation.  No ischemia.  Cassell Clement

## 2011-01-25 ENCOUNTER — Encounter: Payer: BC Managed Care – PPO | Admitting: Internal Medicine

## 2011-01-29 ENCOUNTER — Ambulatory Visit (INDEPENDENT_AMBULATORY_CARE_PROVIDER_SITE_OTHER): Payer: BC Managed Care – PPO | Admitting: Physician Assistant

## 2011-01-29 ENCOUNTER — Encounter: Payer: Self-pay | Admitting: Physician Assistant

## 2011-01-29 DIAGNOSIS — I4891 Unspecified atrial fibrillation: Secondary | ICD-10-CM

## 2011-01-29 DIAGNOSIS — R42 Dizziness and giddiness: Secondary | ICD-10-CM

## 2011-01-29 DIAGNOSIS — R079 Chest pain, unspecified: Secondary | ICD-10-CM

## 2011-01-29 DIAGNOSIS — I10 Essential (primary) hypertension: Secondary | ICD-10-CM | POA: Insufficient documentation

## 2011-01-29 MED ORDER — LISINOPRIL 20 MG PO TABS: 10.0000 mg | ORAL_TABLET | Freq: Every day | ORAL | Status: DC

## 2011-01-29 NOTE — Assessment & Plan Note (Signed)
He has an appointment with Dr. Modesto Charon this week.

## 2011-01-29 NOTE — Assessment & Plan Note (Signed)
Monitor is still pending.  Thus far, no significant pauses or bradycardia arrhythmias.  Finish monitor.  Continue Coumadin.  Followup with Dr. Charlton Haws in 6 weeks.

## 2011-01-29 NOTE — Assessment & Plan Note (Signed)
No recurrence.  As noted, echocardiogram with normal LV function and Myoview without ischemia.  No further workup at this time.

## 2011-01-29 NOTE — Progress Notes (Signed)
[address] [place], [place]  [address] Phone: [phone] Fax:  [phone]  Date:  01/29/2011   Name:  Reginald Green       DOB:  1945/09/30 MRN:  [id number]  PCP:  Dr. Alwyn Ren  Primary Cardiologist:  Dr. Charlton Haws  Primary Electrophysiologist:  None    History of Present Illness: Reginald Green is a 66 y.o. male who presents for follow up from the ED.  He has a history of chronic atrial fibrillation on chronic anticoagulation, hypertension, COPD, gout, hyperlipidemia.  He was seen in the emergency room 01/10/11 with chest discomfort and episodic dizziness.  He also noted a spinning sensation at times.  Cardiac markers were negative in the emergency room.  Potassium was 3.9, creatinine 0.9, hemoglobin 14.6.  Chest x-ray demonstrated chronically elevated left hemidiaphragm, borderline cardiac enlargement, pulmonary vascularity questionable for developing vascular congestion, no active consolidation.  Patient was seen by Dr. Johney Frame.  He felt the patient could be evaluated as an outpatient with an echocardiogram, myoview and an event monitor.  Echocardiogram 01/15/11 for mild LVH, EF 65%, moderate to severe LAE, moderate RAE.  Myoview 01/18/11: No scar or ischemia.  Study not gated due to a true fibrillation.  I have reviewed the available strips from his monitor and these demonstrate atrial fibrillation without significant pauses or bradycardia.  He is doing well.  No further chest pain.  No significant dyspnea.  No orthopnea, PND or edema.  No syncope.  He occasionally has a lightheaded feeling.  He also has a separate symptom of spinning/vertigo.  He is scheduled to see neurology later this week for his vertigo.  His blood pressures do dip into the 90s at times.  He keeps a meticulous reading of his blood pressures.  Past Medical History  Diagnosis Date  . Hypertension   . COPD (chronic obstructive pulmonary disease)   . Gout   . Chronic atrial fibrillation    Multiple failed cardioversions (even on flecanide). Normal LV function by echo in 2009 with LA 45mm, reported nonischemic myoview at that time  . Hyperlipidemia   . Gilbert's disease     Current Outpatient Prescriptions  Medication Sig Dispense Refill  . allopurinol (ZYLOPRIM) 300 MG tablet 1/2 by mouth daily  90 tablet  0  . aspirin 81 MG tablet Take 81 mg by mouth daily.        Marland Kitchen atorvastatin (LIPITOR) 20 MG tablet 1 tablet mon ,wed, fri, sunday  90 tablet  0  . fluticasone (FLONASE) 50 MCG/ACT nasal spray Place 1 spray into the nose 2 (two) times daily.        . Fluticasone-Salmeterol (ADVAIR DISKUS) 250-50 MCG/DOSE AEPB Inhale 1 puff into the lungs every 12 (twelve) hours.        Marland Kitchen lisinopril (PRINIVIL,ZESTRIL) 40 MG tablet Take 40 mg by mouth. 1/2 by mouth daily       . metoprolol (LOPRESSOR) 50 MG tablet Take 1 tablet (50 mg total) by mouth as directed. 1/2 by mouth twice daily  90 tablet  0  . Multiple Vitamin (MULTIVITAMIN) tablet Take 1 tablet by mouth daily.        Marland Kitchen warfarin (COUMADIN) 5 MG tablet Take 5 mg by mouth as directed. Takes 1.5 tablets on Saturday and 1 tablet daily on all other days        Allergies: No Known Allergies  History  Substance Use Topics  . Smoking status: Former Smoker    Types:  Cigarettes    Quit date: 01/09/1997  . Smokeless tobacco: Never Used   Comment: stopped 23 years ago as of 2012  . Alcohol Use: Yes     ROS:  Please see the history of present illness.   All other systems reviewed and negative.   PHYSICAL EXAM: VS:  BP 120/72  Pulse 52  Resp 18  Ht 6\' 3"  (1.905 m)  Wt 223 lb 6.4 oz (101.334 kg)  BMI 27.92 kg/m2 Well nourished, well developed, in no acute distress HEENT: normal Neck: no JVD Cardiac:  normal S1, S2; Irregularly irregular Lungs:  clear to auscultation bilaterally, no wheezing, rhonchi or rales Abd: soft, nontender, no hepatomegaly Ext: no edema Skin: warm and dry Neuro:  CNs 2-12 intact, no focal abnormalities  noted  EKG:   Atrial fibrillation, rate 52, no ischemic changes  ASSESSMENT AND PLAN:

## 2011-01-29 NOTE — Assessment & Plan Note (Signed)
His blood pressure is low at times.  Question if he is symptomatic from this.  His heart rate does increase with activity.  I would not cut back on his metoprolol at this point.  I had a long talk with him about whether or not to change his lisinopril at 10 mg a day.  He would like to try this.  I instructed him to go back to 20 mg a day if his blood pressure increases to 140/90 or higher.

## 2011-01-29 NOTE — Patient Instructions (Signed)
Your physician recommends that you schedule a follow-up appointment in: 6 WEEKS WITH DR. Eden Emms PER SCOTT WEAVER, PA-C  Your physician has recommended you make the following change in your medication: DECREASE LISINOPRIL 20 MG TABLET TAKE 1/2 TABLET DAILY MONITOR BLOOD PRESSURE AND IF SYSTOLIC BLOOD PRESSURE GOES OVER 140 THEN START BACK ON 1 WHOLE TABLET DAILY AND CALL OFFICE TO LET Reginald Green [phone]  FINISH WEARING YOUR HEART MONITOR

## 2011-01-31 ENCOUNTER — Other Ambulatory Visit: Payer: Self-pay | Admitting: Internal Medicine

## 2011-02-01 ENCOUNTER — Ambulatory Visit (INDEPENDENT_AMBULATORY_CARE_PROVIDER_SITE_OTHER): Payer: BC Managed Care – PPO | Admitting: Neurology

## 2011-02-01 ENCOUNTER — Encounter: Payer: Self-pay | Admitting: Neurology

## 2011-02-01 VITALS — BP 118/70 | HR 72 | Ht 73.5 in | Wt 220.0 lb

## 2011-02-01 DIAGNOSIS — R42 Dizziness and giddiness: Secondary | ICD-10-CM

## 2011-02-01 NOTE — Progress Notes (Signed)
Dear Dr. Alwyn Ren,  Thank you for having me see Reginald Green in consultation today at [facility] for his problem with vertigo.  As you may recall, he is a 66 y.o. year old male with a history of chronic atrial fibrillation on warfarin therapy who started having bouts of horizontal vertigo starting 1 year ago.  These would last less than two minutes.  They were not clearly brought on by head movement.  He has baseline right sided hearing loss and left chronic tinnitus, but these have not changed since these began.  The spells of vertigo have gotten more frequent with 5 since September.  There have been about 10 in total.  His last spell of vertigo was early this month.  He denies previous head trauma or URTI.  He did say that one seemed to be associated with him coughing.    He denies neighborhood signs - dysarthria, dysphagia, diplopia.  He has not had a change in his balance.  He is getting some bouts of chest pain that seem to be unrelated and is wearing a cardiac monitor at this time.  He also has a history of what he feels is lightheadedness in that he gets a "heavy feeling" in his forehead.  He has just several days ago reduced his lisinopril because of this, but cannot tell if it has made a difference.   Past Medical History  Diagnosis Date  . Hypertension   . COPD (chronic obstructive pulmonary disease)   . Gout   . Chronic atrial fibrillation     Multiple failed cardioversions (even on flecanide). Normal LV function by echo in 2009 with LA 45mm, reported nonischemic myoview at that time  . Hyperlipidemia   . Gilbert's disease     Past Surgical History  Procedure Date  . Hernia repair     Undescended testicle brought down at this time in 1979  . Appendectomy     At time of hernia repair in 1979  . Tonsillectomy     History   Social History  . Marital Status: Married    Spouse Name: N/A    Number of Children: N/A  . Years of Education: N/A   Social History Main  Topics  . Smoking status: Former Smoker    Types: Cigarettes    Quit date: 02/01/1987  . Smokeless tobacco: Never Used   Comment: stopped 23 years ago as of 2012  . Alcohol Use: Yes  . Drug Use: No  . Sexually Active: No   Other Topics Concern  . None   Social History Narrative  . None    Family History  Problem Relation Age of Onset  . Stroke Mother   . Hypertension Mother   . Coronary artery disease Maternal Uncle     Current Outpatient Prescriptions on File Prior to Visit  Medication Sig Dispense Refill  . ADVAIR DISKUS 250-50 MCG/DOSE AEPB INHALE 1 PUFF BY MOUTH EVERY 12 HOURS  60 each  5  . allopurinol (ZYLOPRIM) 300 MG tablet 1/2 by mouth daily  90 tablet  0  . aspirin 81 MG tablet Take 81 mg by mouth daily.        Marland Kitchen atorvastatin (LIPITOR) 20 MG tablet 1 tablet mon ,wed, fri, sunday  90 tablet  0  . fluticasone (FLONASE) 50 MCG/ACT nasal spray Place 1 spray into the nose 2 (two) times daily.        Marland Kitchen lisinopril (PRINIVIL,ZESTRIL) 20 MG tablet Take 0.5 tablets (10 mg  total) by mouth daily.  30 tablet  11  . metoprolol (LOPRESSOR) 50 MG tablet Take 1 tablet (50 mg total) by mouth as directed. 1/2 by mouth twice daily  90 tablet  0  . Multiple Vitamin (MULTIVITAMIN) tablet Take 1 tablet by mouth daily.        Marland Kitchen warfarin (COUMADIN) 5 MG tablet Take 5 mg by mouth as directed. Takes 1.5 tablets on Saturday and 1 tablet daily on all other days        No Known Allergies    ROS:  13 systems were reviewed and are notable for numbness of his left arm, after a shoulder injury.  All other review of systems are unremarkable.   Examination:  Filed Vitals:   02/01/11 0820  BP: 118/70  Pulse: 72  Height: 6' 1.5" (1.867 m)  Weight: 220 lb (99.791 kg)     In general, well appearing man.    Cardiovascular: The patient has a irregularly irregular and no carotid bruits.  Fundoscopy:  Disks are flat. Vessel caliber within normal limits.  Mental status:   The patient is  oriented to person, place and time. Recent and remote memory are intact. Attention span and concentration are normal. Language including repetition, naming, following commands are intact. Fund of knowledge of current and historical events, as well as vocabulary are normal.  Cranial Nerves: Pupils are equally round and reactive to light. Visual fields full to confrontation. Extraocular movements are intact without nystagmus. Facial sensation and muscles of mastication are intact. Muscles of facial expression are symmetric. Hearing decreased on the right.  Rinne A<C on the right(conductive hearing loss), Weber lateralizes right(conductive). Tongue protrusion, uvula, palate midline.  Shoulder shrug intact.    Motor:  The patient has normal bulk and tone, no pronator drift.  Mild postural tremor worse on the left.  5/5 bilaterally.  Reflexes:   Biceps  Triceps Brachioradialis Knee Ankle  Right 2+  2+  2+   2+ 2+  Left  2+  2+  2+   2+ 2+  Toes down  Coordination:  Normal finger to nose.  No dysdiadokinesia.  Sensation is intact to vibration bilaterally.  There does appear to be some temperature sensory loss in the left foot.  I did not check his left arm.  Gait and Station are normal.  Tandem gait is intact.  Romberg is negative.  Vestibular:  - Dix-Hallpike ; ? catchup saccade on head thrust to the left. ; [place] + with left ward turning; no head shaking nystagmus.    Impression/Recs: Short bouts of horizontal vertigo that come on without clear provocation.  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  I am going to get an MRI brain and MRA head and neck to look for BV insufficiency.  If this is normal we will discuss sending him to an otologist for further workup.  We will call the patient with the results of his MRI.  If it is normal, then I would refer him to otology.  Thank you for having [place] see Reginald Pate Goucher  in consultation.  Feel free to contact me with any questions.  Lupita Raider Modesto Charon, MD [facility], [place] [address] [place], [place] [address] Phone: [phone] Fax: [phone].

## 2011-02-01 NOTE — Patient Instructions (Signed)
Your MRI/MRA is scheduled for Thursday, Feb. 7th at 8:00am.  Please arrive to [facility] by 7:45am  [phone].

## 2011-02-07 ENCOUNTER — Encounter: Payer: Self-pay | Admitting: Internal Medicine

## 2011-02-07 ENCOUNTER — Ambulatory Visit (INDEPENDENT_AMBULATORY_CARE_PROVIDER_SITE_OTHER): Payer: BC Managed Care – PPO | Admitting: Internal Medicine

## 2011-02-07 DIAGNOSIS — I10 Essential (primary) hypertension: Secondary | ICD-10-CM

## 2011-02-07 DIAGNOSIS — I4891 Unspecified atrial fibrillation: Secondary | ICD-10-CM

## 2011-02-07 DIAGNOSIS — N400 Enlarged prostate without lower urinary tract symptoms: Secondary | ICD-10-CM

## 2011-02-07 DIAGNOSIS — M109 Gout, unspecified: Secondary | ICD-10-CM

## 2011-02-07 DIAGNOSIS — E785 Hyperlipidemia, unspecified: Secondary | ICD-10-CM

## 2011-02-07 MED ORDER — ATORVASTATIN CALCIUM 20 MG PO TABS: ORAL_TABLET | ORAL | Status: DC

## 2011-02-07 MED ORDER — ALLOPURINOL 300 MG PO TABS: ORAL_TABLET | ORAL | Status: DC

## 2011-02-07 MED ORDER — METOPROLOL TARTRATE 50 MG PO TABS: 50.0000 mg | ORAL_TABLET | ORAL | Status: DC

## 2011-02-07 MED ORDER — FLUTICASONE PROPIONATE 50 MCG/ACT NA SUSP: 1.0000 | Freq: Two times a day (BID) | NASAL | Status: DC

## 2011-02-07 NOTE — Assessment & Plan Note (Signed)
Rate is well controlled at present; no change in. Event monitor in place as noted

## 2011-02-07 NOTE — Progress Notes (Signed)
  Subjective:    Patient ID: Reginald Green, male    DOB: 12/13/1945, 66 y.o.   MRN: [id number]  HPI He was originally scheduled for an annual physical. He's actually been evaluated in detail by the cardiologist and neurologist. He is wearing an event monitor @ present. Labs 01/02/2011 reviewed; these were excellent. No uric acid was performed.    Review of Systems  He's had one gout attack in the last 2 months. Triglycerides were 68 in December indicating no significant dietary risk for gout. With the statin he denies abdominal pain, bowel changes, or significant myalgia.     Objective:   Physical Exam  He appears healthy and well-nourished; he is in no acute distress  Thyroid is normal without nodularity or enlargement  No carotid bruits are present.  Slightly irregular, slow rhythm  with no significant murmurs or gallops.  Chest is clear with no increased work of breathing  There is no evidence of aortic aneurysm or renal artery bruits  He has no clubbing or edema.   Pedal pulses are intact   Deep tendon reflexes are 1+ and equal  The great toenails are absent. There is no tenderness at the base of the large toes even to percussion  No ischemic skin changes are present ; skin of the feet is dry        Assessment & Plan:

## 2011-02-07 NOTE — Assessment & Plan Note (Signed)
Lipids are at goal; meds will be reviewed

## 2011-02-07 NOTE — Assessment & Plan Note (Signed)
Blood pressure is well controlled on the beta blocker and low-dose ACE inhibitor. Meds will be refilled

## 2011-02-07 NOTE — Assessment & Plan Note (Signed)
Uric acid will be checked. 

## 2011-02-07 NOTE — Assessment & Plan Note (Signed)
PSA is varied from 0.21-0.37. Is no family history of prostate cancer.

## 2011-02-07 NOTE — Patient Instructions (Signed)
Preventive Health Care: Exercise at least 30-45 minutes a day,  3-4 days a week.  Eat a low-fat diet with lots of fruits and vegetables, up to 7-9 servings per day. Consume less than 40 grams of sugar per day from foods & drinks with High Fructose Corn Sugar as # 1,2,3 or # 4 on label. Blood Pressure Goal  Ideally is an AVERAGE < 135/85. This AVERAGE should be calculated from @ least 5-7 BP readings taken @ different times of day on different days of week. You should not respond to isolated BP readings , but rather the AVERAGE for that week   

## 2011-02-14 ENCOUNTER — Ambulatory Visit (HOSPITAL_COMMUNITY)
Admission: RE | Admit: 2011-02-14 | Discharge: 2011-02-14 | Disposition: A | Discharge status: Home / Self Care | Payer: BC Managed Care – PPO | Source: Ambulatory Visit | Attending: Neurology | Admitting: Neurology

## 2011-02-14 ENCOUNTER — Ambulatory Visit (HOSPITAL_COMMUNITY): Payer: BC Managed Care – PPO

## 2011-02-14 ENCOUNTER — Ambulatory Visit (HOSPITAL_COMMUNITY): Admission: RE | Admit: 2011-02-14 | Payer: BC Managed Care – PPO | Source: Ambulatory Visit

## 2011-02-14 DIAGNOSIS — G319 Degenerative disease of nervous system, unspecified: Secondary | ICD-10-CM | POA: Insufficient documentation

## 2011-02-14 DIAGNOSIS — I4891 Unspecified atrial fibrillation: Secondary | ICD-10-CM | POA: Insufficient documentation

## 2011-02-14 DIAGNOSIS — I10 Essential (primary) hypertension: Secondary | ICD-10-CM | POA: Insufficient documentation

## 2011-02-14 DIAGNOSIS — R42 Dizziness and giddiness: Secondary | ICD-10-CM | POA: Insufficient documentation

## 2011-02-14 MED ORDER — GADOBENATE DIMEGLUMINE 529 MG/ML IV SOLN
20.0000 mL | Freq: Once | INTRAVENOUS | Status: AC | PRN
  Administered 2011-02-14: 20 mL via INTRAVENOUS

## 2011-02-18 ENCOUNTER — Ambulatory Visit (INDEPENDENT_AMBULATORY_CARE_PROVIDER_SITE_OTHER): Payer: BC Managed Care – PPO

## 2011-02-18 DIAGNOSIS — I4891 Unspecified atrial fibrillation: Secondary | ICD-10-CM

## 2011-02-18 LAB — POCT INR: INR: 2.4

## 2011-02-19 ENCOUNTER — Telehealth: Payer: Self-pay | Admitting: Neurology

## 2011-02-19 NOTE — Telephone Encounter (Signed)
Called and spoke with the patient. Information given as directed by Dr. Modesto Charon re: MRI and MRA results. Patient informed that Dr. Nash Dimmer recommendation at this point was a referral to otology, probably at [facility]. The patient declined the referral at this time stating he would think about it for a bit and let us know if he changed his mind. Reports his last bout of vertigo was 01/10/11. I told him to please call us if he decided to pursue a specialist. The patient states he will. Dr. Modesto Charon.....Marland KitchenFYI only.

## 2011-02-19 NOTE — Telephone Encounter (Signed)
Message copied by Benay Spice on Tue Feb 19, 2011 12:19 PM ------      Message from: Milas Gain      Created: Tue Feb 19, 2011 12:17 AM       MRI Brain and MRA head and neck are normal.  Please let the patient know.  I would suggest a referral to an otologist -- it would probably be worth going to [facility].  Does he want this?

## 2011-03-14 ENCOUNTER — Encounter: Payer: Self-pay | Admitting: Cardiovascular Disease

## 2011-03-14 ENCOUNTER — Ambulatory Visit (INDEPENDENT_AMBULATORY_CARE_PROVIDER_SITE_OTHER): Payer: BC Managed Care – PPO | Admitting: Pharmacist

## 2011-03-14 ENCOUNTER — Ambulatory Visit (INDEPENDENT_AMBULATORY_CARE_PROVIDER_SITE_OTHER): Payer: BC Managed Care – PPO | Admitting: Cardiovascular Disease

## 2011-03-14 DIAGNOSIS — E785 Hyperlipidemia, unspecified: Secondary | ICD-10-CM

## 2011-03-14 DIAGNOSIS — I10 Essential (primary) hypertension: Secondary | ICD-10-CM

## 2011-03-14 DIAGNOSIS — I4891 Unspecified atrial fibrillation: Secondary | ICD-10-CM

## 2011-03-14 DIAGNOSIS — R42 Dizziness and giddiness: Secondary | ICD-10-CM

## 2011-03-14 LAB — POCT INR: INR: 2.3

## 2011-03-14 MED ORDER — WARFARIN SODIUM 5 MG PO TABS: 5.0000 mg | ORAL_TABLET | ORAL | Status: DC

## 2011-03-14 NOTE — Assessment & Plan Note (Signed)
Extensive w/u no abnormality with heart or neuro.  Symptoms resolved.  Observe

## 2011-03-14 NOTE — Patient Instructions (Signed)
Your physician wants you to follow-up in:  6 MONTHS WITH DR NISHAN  You will receive a reminder letter in the mail two months in advance. If you don't receive a letter, please call our office to schedule the follow-up appointment. Your physician recommends that you continue on your current medications as directed. Please refer to the Current Medication list given to you today. 

## 2011-03-14 NOTE — Progress Notes (Signed)
Reginald Green is a 66 y.o. male who presents for follow up from the ED. He has a history of chronic atrial fibrillation on chronic anticoagulation, hypertension, COPD, gout, hyperlipidemia. He was seen in the emergency room 01/10/11 with chest discomfort and episodic dizziness. He also noted a spinning sensation at times. Cardiac markers were negative in the emergency room. Potassium was 3.9, creatinine 0.9, hemoglobin 14.6. Chest x-ray demonstrated chronically elevated left hemidiaphragm, borderline cardiac enlargement, pulmonary vascularity questionable for developing vascular congestion, no active consolidation. Patient was seen by Dr. Johney Frame. He felt the patient could be evaluated as an outpatient with an echocardiogram, myoview and an event monitor. Echocardiogram 01/15/11 for mild LVH, EF 65%, moderate to severe LAE, moderate RAE. Myoview 01/18/11: No scar or ischemia. Study not gated due to a true fibrillation. I have reviewed the available strips from his monitor and these demonstrate atrial fibrillation without significant pauses or bradycardia.  Saw Dr Modesto Charon and posterior circulation MRI/MRA was fine.  Symptoms have not recurred  ROS: Denies fever, malais, weight loss, blurry vision, decreased visual acuity, cough, sputum, SOB, hemoptysis, pleuritic pain, palpitaitons, heartburn, abdominal pain, melena, lower extremity edema, claudication, or rash.  All other systems reviewed and negative  General: Affect appropriate Healthy:  appears stated age HEENT: normal Neck supple with no adenopathy JVP normal no bruits no thyromegaly Lungs clear with no wheezing and good diaphragmatic motion Heart:  S1/S2 no murmur, no rub, gallop or click PMI normal Abdomen: benighn, BS positve, no tenderness, no AAA no bruit.  No HSM or HJR Distal pulses intact with no bruits No edema Neuro non-focal Skin warm and dry No muscular weakness   Current Outpatient Prescriptions  Medication Sig Dispense Refill  .  ADVAIR DISKUS 250-50 MCG/DOSE AEPB INHALE 1 PUFF BY MOUTH EVERY 12 HOURS  60 each  5  . allopurinol (ZYLOPRIM) 300 MG tablet 1/2 by mouth daily  90 tablet  1  . aspirin 81 MG tablet Take 81 mg by mouth daily.        Marland Kitchen atorvastatin (LIPITOR) 20 MG tablet 1 tablet mon ,wed, fri, sunday  90 tablet  3  . fluticasone (FLONASE) 50 MCG/ACT nasal spray Place 1 spray into the nose 2 (two) times daily.  1 g  11  . lisinopril (PRINIVIL,ZESTRIL) 20 MG tablet Take 0.5 tablets (10 mg total) by mouth daily.  30 tablet  11  . metoprolol (LOPRESSOR) 50 MG tablet Take 1 tablet (50 mg total) by mouth as directed. 1/2 by mouth twice daily  90 tablet  3  . Multiple Vitamin (MULTIVITAMIN) tablet Take 1 tablet by mouth daily.        Marland Kitchen warfarin (COUMADIN) 5 MG tablet Take 5 mg by mouth as directed. Takes 1.5 tablets on Saturday and 1 tablet daily on all other days        Allergies  Review of patient's allergies indicates no known allergies.  Electrocardiogram:  Assessment and Plan

## 2011-03-14 NOTE — Assessment & Plan Note (Signed)
Well controlled.  Continue current medications and low sodium Dash type diet.    

## 2011-03-14 NOTE — Assessment & Plan Note (Signed)
Cholesterol is at goal.  Continue current dose of statin and diet Rx.  No myalgias or side effects.  F/U  LFT's in 6 months. Lab Results  Component Value Date   LDLCALC 75 12/21/2010             

## 2011-03-14 NOTE — Assessment & Plan Note (Signed)
Good rate control  Consider changing to Xarelto in a year

## 2011-04-15 NOTE — ED Provider Notes (Signed)
Medical screening examination/treatment/procedure(s) were performed by non-physician practitioner and as supervising physician I was immediately available for consultation/collaboration.  Jasmine Awe, MD 04/15/11 2300

## 2011-04-25 ENCOUNTER — Ambulatory Visit (INDEPENDENT_AMBULATORY_CARE_PROVIDER_SITE_OTHER): Payer: BC Managed Care – PPO

## 2011-04-25 DIAGNOSIS — I4891 Unspecified atrial fibrillation: Secondary | ICD-10-CM

## 2011-04-25 LAB — POCT INR: INR: 2

## 2011-06-06 ENCOUNTER — Ambulatory Visit (INDEPENDENT_AMBULATORY_CARE_PROVIDER_SITE_OTHER): Payer: BC Managed Care – PPO | Admitting: Pharmacist

## 2011-06-06 DIAGNOSIS — I4891 Unspecified atrial fibrillation: Secondary | ICD-10-CM

## 2011-07-18 ENCOUNTER — Ambulatory Visit (INDEPENDENT_AMBULATORY_CARE_PROVIDER_SITE_OTHER): Payer: BC Managed Care – PPO | Admitting: *Deleted

## 2011-07-18 DIAGNOSIS — I4891 Unspecified atrial fibrillation: Secondary | ICD-10-CM

## 2011-08-08 ENCOUNTER — Ambulatory Visit (INDEPENDENT_AMBULATORY_CARE_PROVIDER_SITE_OTHER): Payer: BC Managed Care – PPO

## 2011-08-08 DIAGNOSIS — I4891 Unspecified atrial fibrillation: Secondary | ICD-10-CM

## 2011-08-18 ENCOUNTER — Encounter (HOSPITAL_BASED_OUTPATIENT_CLINIC_OR_DEPARTMENT_OTHER): Payer: Self-pay | Admitting: Emergency Medicine

## 2011-08-18 ENCOUNTER — Emergency Department (HOSPITAL_BASED_OUTPATIENT_CLINIC_OR_DEPARTMENT_OTHER)
Admission: EM | Admit: 2011-08-18 | Discharge: 2011-08-18 | Disposition: A | Discharge status: Home / Self Care | Payer: BC Managed Care – PPO | Attending: Emergency Medicine | Admitting: Emergency Medicine

## 2011-08-18 DIAGNOSIS — W268XXA Contact with other sharp object(s), not elsewhere classified, initial encounter: Secondary | ICD-10-CM | POA: Insufficient documentation

## 2011-08-18 DIAGNOSIS — J449 Chronic obstructive pulmonary disease, unspecified: Secondary | ICD-10-CM | POA: Insufficient documentation

## 2011-08-18 DIAGNOSIS — Z87891 Personal history of nicotine dependence: Secondary | ICD-10-CM | POA: Insufficient documentation

## 2011-08-18 DIAGNOSIS — S71109A Unspecified open wound, unspecified thigh, initial encounter: Secondary | ICD-10-CM | POA: Insufficient documentation

## 2011-08-18 DIAGNOSIS — S71009A Unspecified open wound, unspecified hip, initial encounter: Secondary | ICD-10-CM | POA: Insufficient documentation

## 2011-08-18 DIAGNOSIS — S81819A Laceration without foreign body, unspecified lower leg, initial encounter: Secondary | ICD-10-CM

## 2011-08-18 DIAGNOSIS — E785 Hyperlipidemia, unspecified: Secondary | ICD-10-CM | POA: Insufficient documentation

## 2011-08-18 DIAGNOSIS — J4489 Other specified chronic obstructive pulmonary disease: Secondary | ICD-10-CM | POA: Insufficient documentation

## 2011-08-18 DIAGNOSIS — I4891 Unspecified atrial fibrillation: Secondary | ICD-10-CM | POA: Insufficient documentation

## 2011-08-18 DIAGNOSIS — Z7901 Long term (current) use of anticoagulants: Secondary | ICD-10-CM | POA: Insufficient documentation

## 2011-08-18 DIAGNOSIS — M109 Gout, unspecified: Secondary | ICD-10-CM | POA: Insufficient documentation

## 2011-08-18 DIAGNOSIS — I10 Essential (primary) hypertension: Secondary | ICD-10-CM | POA: Insufficient documentation

## 2011-08-18 MED ORDER — LIDOCAINE HCL 2 % IJ SOLN
INTRAMUSCULAR | Status: AC
  Filled 2011-08-18: qty 1

## 2011-08-18 NOTE — ED Notes (Signed)
Laceration to left thigh with chain saw.  Bleeding controlled.  Pt on coumadin.

## 2011-08-18 NOTE — ED Provider Notes (Signed)
History     CSN: [id number]  Arrival date & time 08/18/11  1216   First MD Initiated Contact with Patient 08/18/11 1313      Chief Complaint  Patient presents with  . Extremity Laceration    (Consider location/radiation/quality/duration/timing/severity/associated sxs/prior treatment) Patient is a 66 y.o. male presenting with skin laceration. The history is provided by the patient. No language interpreter was used.  Laceration  The incident occurred 1 to 2 hours ago. The laceration is located on the left leg. The laceration is 3 cm in size. The laceration mechanism was a a metal edge (a chainsaw, not running). The pain is at a severity of 4/10. The pain is moderate. The pain has been constant since onset. He reports no foreign bodies present. His tetanus status is UTD.    Past Medical History  Diagnosis Date  . Hypertension   . COPD (chronic obstructive pulmonary disease)   . Gout   . Chronic atrial fibrillation     Multiple failed cardioversions (even on flecanide). Normal LV function by echo in 2009 with LA 45mm, reported nonischemic myoview at that time  . Hyperlipidemia   . Gilbert's disease     Past Surgical History  Procedure Date  . Hernia repair     Undescended testicle brought down at this time in 1979  . Appendectomy     At time of hernia repair in 1979  . Tonsillectomy     Family History  Problem Relation Age of Onset  . Stroke Mother   . Hypertension Mother   . Coronary artery disease Maternal Uncle     History  Substance Use Topics  . Smoking status: Former Smoker    Types: Cigarettes    Quit date: 02/01/1987  . Smokeless tobacco: Never Used   Comment: stopped 23 years ago as of 2012  . Alcohol Use: Yes      Review of Systems  Skin: Positive for wound.  All other systems reviewed and are negative.    Allergies  Review of patient's allergies indicates no known allergies.  Home Medications   Current Outpatient Rx  Name Route Sig  Dispense Refill  . ADVAIR DISKUS 250-50 MCG/DOSE IN AEPB  INHALE 1 PUFF BY MOUTH EVERY 12 HOURS 60 each 5    Rx has expired - unused refills remain  . ALLOPURINOL 300 MG PO TABS  1/2 by mouth daily 90 tablet 1  . ASPIRIN 81 MG PO TABS Oral Take 81 mg by mouth daily.      . ATORVASTATIN CALCIUM 20 MG PO TABS  1 tablet mon ,wed, fri, sunday 90 tablet 3  . FLUTICASONE PROPIONATE 50 MCG/ACT NA SUSP Nasal Place 1 spray into the nose 2 (two) times daily. 1 g 11  . LISINOPRIL 20 MG PO TABS Oral Take 0.5 tablets (10 mg total) by mouth daily. 30 tablet 11  . METOPROLOL TARTRATE 50 MG PO TABS Oral Take 1 tablet (50 mg total) by mouth as directed. 1/2 by mouth twice daily 90 tablet 3  . ONE-DAILY MULTI VITAMINS PO TABS Oral Take 1 tablet by mouth daily.      . WARFARIN SODIUM 5 MG PO TABS Oral Take 5 mg by mouth as directed. On Mon, Wed, Fri, Sat.    Takes 7.5mg  on rest of week.  This is a 90 day supply      There were no vitals taken for this visit.  Physical Exam  Nursing note and vitals reviewed. Constitutional: He is  oriented to person, place, and time. He appears well-developed and well-nourished.  HENT:  Head: Normocephalic.  Musculoskeletal: Normal range of motion. He exhibits tenderness.  Neurological: He is alert and oriented to person, place, and time. He has normal reflexes.  Skin: Skin is dry.       3cm laceration left thigh, gapping  Psychiatric: He has a normal mood and affect.    ED Course  LACERATION REPAIR Date/Time: 08/18/2011 1:50 PM Performed by: Elson Areas Authorized by: Elson Areas Consent: Verbal consent obtained. Consent given by: patient Patient understanding: patient states understanding of the procedure being performed Time out: Immediately prior to procedure a "time out" was called to verify the correct patient, procedure, equipment, support staff and site/side marked as required. Body area: lower extremity Location details: left lower leg Tendon  involvement: none Nerve involvement: none Vascular damage: no Anesthesia: local infiltration Preparation: Patient was prepped and draped in the usual sterile fashion. Irrigation solution: saline Debridement: none Skin closure: 4-0 Prolene Number of sutures: 6 Technique: simple Approximation: loose Approximation difficulty: simple Patient tolerance: Patient tolerated the procedure well with no immediate complications.   (including critical care time)  Labs Reviewed - No data to display No results found.   1. Laceration of leg       MDM  Pt advised suture removal in 10 days.  Laceration is over quadracep muscle.          Lonia Skinner [place], [place] 08/18/11 1352  Lonia Skinner [place], [place] 08/18/11 1353

## 2011-08-18 NOTE — ED Provider Notes (Signed)
Medical screening examination/treatment/procedure(s) were performed by non-physician practitioner and as supervising physician I was immediately available for consultation/collaboration.   Charles B. Bernette Mayers, MD 08/18/11 1353

## 2011-08-29 ENCOUNTER — Ambulatory Visit (INDEPENDENT_AMBULATORY_CARE_PROVIDER_SITE_OTHER): Payer: BC Managed Care – PPO | Admitting: *Deleted

## 2011-08-29 DIAGNOSIS — I4891 Unspecified atrial fibrillation: Secondary | ICD-10-CM

## 2011-08-29 LAB — POCT INR: INR: 2.4

## 2011-09-01 ENCOUNTER — Other Ambulatory Visit: Payer: Self-pay | Admitting: Internal Medicine

## 2011-09-11 ENCOUNTER — Encounter: Payer: Self-pay | Admitting: Pharmacist

## 2011-09-26 ENCOUNTER — Ambulatory Visit (INDEPENDENT_AMBULATORY_CARE_PROVIDER_SITE_OTHER): Payer: BC Managed Care – PPO | Admitting: Pharmacist

## 2011-09-26 DIAGNOSIS — I4891 Unspecified atrial fibrillation: Secondary | ICD-10-CM

## 2011-10-24 ENCOUNTER — Ambulatory Visit (INDEPENDENT_AMBULATORY_CARE_PROVIDER_SITE_OTHER): Payer: BC Managed Care – PPO | Admitting: General Practice

## 2011-10-24 DIAGNOSIS — I4891 Unspecified atrial fibrillation: Secondary | ICD-10-CM

## 2011-10-31 ENCOUNTER — Other Ambulatory Visit: Payer: Self-pay | Admitting: Cardiovascular Disease

## 2011-11-01 ENCOUNTER — Other Ambulatory Visit: Payer: Self-pay | Admitting: *Deleted

## 2011-11-01 NOTE — Telephone Encounter (Signed)
This is  3 months supply

## 2011-11-13 ENCOUNTER — Telehealth: Payer: Self-pay | Admitting: Internal Medicine

## 2011-11-13 MED ORDER — ALLOPURINOL 300 MG PO TABS: ORAL_TABLET | ORAL | Status: DC

## 2011-11-13 NOTE — Telephone Encounter (Signed)
Refill: Allopurinol 300 mg tab. Take 1/2 tablet by mouth once daily. Last fill 11-13-11

## 2011-11-13 NOTE — Telephone Encounter (Signed)
RX sent

## 2011-12-04 ENCOUNTER — Ambulatory Visit (INDEPENDENT_AMBULATORY_CARE_PROVIDER_SITE_OTHER): Payer: BC Managed Care – PPO | Admitting: General Practice

## 2011-12-04 DIAGNOSIS — I4891 Unspecified atrial fibrillation: Secondary | ICD-10-CM

## 2011-12-04 LAB — POCT INR: INR: 2.8

## 2012-01-13 ENCOUNTER — Other Ambulatory Visit: Payer: Self-pay | Admitting: Internal Medicine

## 2012-01-13 DIAGNOSIS — I10 Essential (primary) hypertension: Secondary | ICD-10-CM

## 2012-01-13 DIAGNOSIS — I4891 Unspecified atrial fibrillation: Secondary | ICD-10-CM

## 2012-01-13 DIAGNOSIS — M109 Gout, unspecified: Secondary | ICD-10-CM

## 2012-01-13 DIAGNOSIS — R42 Dizziness and giddiness: Secondary | ICD-10-CM

## 2012-01-13 MED ORDER — ALLOPURINOL 300 MG PO TABS: ORAL_TABLET | ORAL | Status: DC

## 2012-01-13 NOTE — Telephone Encounter (Signed)
Refill: Allopurinol 300 mg tab. Take 1/2 tablet by mouth once daily. Last fill 1.4.2014

## 2012-01-13 NOTE — Telephone Encounter (Signed)
RX sent, Future orders placed

## 2012-01-15 ENCOUNTER — Ambulatory Visit (INDEPENDENT_AMBULATORY_CARE_PROVIDER_SITE_OTHER): Payer: BC Managed Care – PPO | Admitting: General Practice

## 2012-01-15 DIAGNOSIS — I4891 Unspecified atrial fibrillation: Secondary | ICD-10-CM

## 2012-01-15 LAB — POCT INR: INR: 3.4

## 2012-01-28 ENCOUNTER — Ambulatory Visit: Payer: BC Managed Care – PPO | Admitting: Cardiovascular Disease

## 2012-01-31 ENCOUNTER — Other Ambulatory Visit: Payer: Self-pay | Admitting: Internal Medicine

## 2012-01-31 NOTE — Telephone Encounter (Signed)
Patient needs to schedule a CPX  

## 2012-02-04 ENCOUNTER — Other Ambulatory Visit: Payer: Self-pay | Admitting: General Practice

## 2012-02-04 MED ORDER — WARFARIN SODIUM 5 MG PO TABS: ORAL_TABLET | ORAL | Status: DC

## 2012-02-04 MED ORDER — WARFARIN SODIUM 5 MG PO TABS: 5.0000 mg | ORAL_TABLET | Freq: Every day | ORAL | Status: DC

## 2012-02-26 ENCOUNTER — Ambulatory Visit (INDEPENDENT_AMBULATORY_CARE_PROVIDER_SITE_OTHER): Payer: BC Managed Care – PPO | Admitting: General Practice

## 2012-02-26 LAB — POCT INR: INR: 2.5

## 2012-03-05 ENCOUNTER — Telehealth: Payer: Self-pay | Admitting: Internal Medicine

## 2012-03-05 MED ORDER — FLUTICASONE PROPIONATE 50 MCG/ACT NA SUSP: 1.0000 | Freq: Two times a day (BID) | NASAL | Status: DC

## 2012-03-05 NOTE — Telephone Encounter (Signed)
RX sent, patient with pending appointment 04/2012

## 2012-03-05 NOTE — Telephone Encounter (Signed)
refill Fluticasone 50 MCG/INH AER, Shake well before each use & Instill 1 spray into the nostril 2 (two) times daily. #16, last fill 1.19.14

## 2012-03-09 ENCOUNTER — Encounter: Payer: Self-pay | Admitting: Cardiovascular Disease

## 2012-03-09 ENCOUNTER — Ambulatory Visit (INDEPENDENT_AMBULATORY_CARE_PROVIDER_SITE_OTHER): Payer: BC Managed Care – PPO | Admitting: Cardiovascular Disease

## 2012-03-09 VITALS — BP 110/78 | HR 52 | Ht 75.0 in | Wt 218.8 lb

## 2012-03-09 DIAGNOSIS — E785 Hyperlipidemia, unspecified: Secondary | ICD-10-CM

## 2012-03-09 DIAGNOSIS — I4891 Unspecified atrial fibrillation: Secondary | ICD-10-CM

## 2012-03-09 DIAGNOSIS — I10 Essential (primary) hypertension: Secondary | ICD-10-CM

## 2012-03-09 NOTE — Progress Notes (Signed)
Patient ID: Reginald Green, male   DOB: 15-Apr-1945, 67 y.o.   MRN: [id number] Reginald Green is a 67 y.o. male who presents for follow up from the ED. He has a history of chronic atrial fibrillation on chronic anticoagulation, hypertension, COPD, gout, hyperlipidemia. He was seen in the emergency room 01/10/11 with chest discomfort and episodic dizziness. He also noted a spinning sensation at times. Cardiac markers were negative in the emergency room. Potassium was 3.9, creatinine 0.9, hemoglobin 14.6. Chest x-ray demonstrated chronically elevated left hemidiaphragm, borderline cardiac enlargement, pulmonary vascularity questionable for developing vascular congestion, no active consolidation. Patient was seen by Dr. Johney Frame. He felt the patient could be evaluated as an outpatient with an echocardiogram, myoview and an event monitor. Echocardiogram 01/15/11 for mild LVH, EF 65%, moderate to severe LAE, moderate RAE. Myoview 01/18/11: No scar or ischemia. Study not gated due to a true fibrillation. I have reviewed the available strips from his monitor and these demonstrate atrial fibrillation without significant pauses or bradycardia.  Saw Dr Modesto Charon and posterior circulation MRI/MRA was fine. Symptoms have not recurred  Event monitor 2/13 with persistant afib. No pauses and reasonable rate control PVC no sustained VT  ROS: Denies fever, malais, weight loss, blurry vision, decreased visual acuity, cough, sputum, SOB, hemoptysis, pleuritic pain, palpitaitons, heartburn, abdominal pain, melena, lower extremity edema, claudication, or rash.  All other systems reviewed and negative  General: Affect appropriate Healthy:  appears stated age HEENT: normal Neck supple with no adenopathy JVP normal no bruits no thyromegaly Lungs clear with no wheezing and good diaphragmatic motion Heart:  S1/S2 no murmur, no rub, gallop or click PMI normal Abdomen: benighn, BS positve, no tenderness, no AAA no bruit.  No HSM or  HJR Distal pulses intact with no bruits No edema Neuro non-focal Skin warm and dry No muscular weakness   Current Outpatient Prescriptions  Medication Sig Dispense Refill  . allopurinol (ZYLOPRIM) 300 MG tablet LABS DUE, 1/2 by mouth daily  15 tablet  0  . aspirin 81 MG tablet Take 81 mg by mouth daily.        Marland Kitchen atorvastatin (LIPITOR) 20 MG tablet 1 tablet mon ,wed, fri, sunday  90 tablet  3  . fluticasone (FLONASE) 50 MCG/ACT nasal spray Place 1 spray into the nose 2 (two) times daily.  1 g  1  . Fluticasone-Salmeterol (ADVAIR DISKUS) 250-50 MCG/DOSE AEPB APPOINTMENT DUE, inhale 1 puff by mouth every 12 hours  60 each  1  . lisinopril (PRINIVIL,ZESTRIL) 20 MG tablet Take 0.5 tablets (10 mg total) by mouth daily.  30 tablet  11  . metoprolol (LOPRESSOR) 50 MG tablet Take 1 tablet (50 mg total) by mouth as directed. 1/2 by mouth twice daily  90 tablet  3  . Multiple Vitamin (MULTIVITAMIN) tablet Take 1 tablet by mouth daily.        Marland Kitchen warfarin (COUMADIN) 5 MG tablet Take 1 tablet (5 mg total) by mouth daily. Take as directed by coumadin clinic.  120 tablet  1   No current facility-administered medications for this visit.    Allergies  Review of patient's allergies indicates no known allergies.  Electrocardiogram: 01/29/11  afib no acute changes  Assessment and Plan

## 2012-03-09 NOTE — Assessment & Plan Note (Signed)
Cholesterol is at goal.  Continue current dose of statin and diet Rx.  No myalgias or side effects.  F/U  LFT's in 6 months. Lab Results  Component Value Date   LDLCALC 75 12/21/2010

## 2012-03-09 NOTE — Assessment & Plan Note (Signed)
Good rate conrol  Coumadin dose increased a few months ago  No bleeding issues.  Discussed novel agensts.  He just started medicare and drugs are expensive He will let me know if he wants to change

## 2012-03-09 NOTE — Assessment & Plan Note (Signed)
Well controlled.  Continue current medications and low sodium Dash type diet.    

## 2012-03-10 ENCOUNTER — Ambulatory Visit: Payer: BC Managed Care – PPO | Admitting: Cardiovascular Disease

## 2012-04-08 ENCOUNTER — Ambulatory Visit (INDEPENDENT_AMBULATORY_CARE_PROVIDER_SITE_OTHER): Payer: 59 | Admitting: General Practice

## 2012-04-08 DIAGNOSIS — I4891 Unspecified atrial fibrillation: Secondary | ICD-10-CM

## 2012-04-08 LAB — POCT INR: INR: 3

## 2012-04-13 ENCOUNTER — Telehealth: Payer: Self-pay | Admitting: Internal Medicine

## 2012-04-13 MED ORDER — METOPROLOL TARTRATE 50 MG PO TABS: 50.0000 mg | ORAL_TABLET | ORAL | Status: DC

## 2012-04-13 NOTE — Telephone Encounter (Signed)
Refill: Metoprolol 50 mg tab. Take 1/2 tablet by mouth twice daily with food. Qty 180. Last fill 10-10-11

## 2012-04-13 NOTE — Telephone Encounter (Signed)
RX sent electronically 

## 2012-05-05 ENCOUNTER — Encounter: Payer: Self-pay | Admitting: Internal Medicine

## 2012-05-05 ENCOUNTER — Ambulatory Visit (INDEPENDENT_AMBULATORY_CARE_PROVIDER_SITE_OTHER): Payer: 59 | Admitting: Internal Medicine

## 2012-05-05 VITALS — BP 120/78 | HR 66 | Temp 97.6°F | Resp 14 | Ht 75.0 in | Wt 216.0 lb

## 2012-05-05 DIAGNOSIS — E785 Hyperlipidemia, unspecified: Secondary | ICD-10-CM

## 2012-05-05 DIAGNOSIS — Z0000 Encounter for general adult medical examination without abnormal findings: Secondary | ICD-10-CM

## 2012-05-05 DIAGNOSIS — Z139 Encounter for screening, unspecified: Secondary | ICD-10-CM

## 2012-05-05 DIAGNOSIS — M109 Gout, unspecified: Secondary | ICD-10-CM

## 2012-05-05 DIAGNOSIS — I10 Essential (primary) hypertension: Secondary | ICD-10-CM

## 2012-05-05 DIAGNOSIS — Z23 Encounter for immunization: Secondary | ICD-10-CM

## 2012-05-05 LAB — BASIC METABOLIC PANEL
CO2: 25 mEq/L (ref 19–32)
Calcium: 8.9 mg/dL (ref 8.4–10.5)
Glucose, Bld: 89 mg/dL (ref 70–99)
Potassium: 4.4 mEq/L (ref 3.5–5.1)
Sodium: 135 mEq/L (ref 135–145)

## 2012-05-05 LAB — LIPID PANEL
Cholesterol: 136 mg/dL (ref 0–200)
LDL Cholesterol: 61 mg/dL (ref 0–99)
Triglycerides: 69 mg/dL (ref 0.0–149.0)

## 2012-05-05 LAB — HEPATIC FUNCTION PANEL
ALT: 24 U/L (ref 0–53)
Total Bilirubin: 1.3 mg/dL — ABNORMAL HIGH (ref 0.3–1.2)

## 2012-05-05 MED ORDER — ATORVASTATIN CALCIUM 20 MG PO TABS: ORAL_TABLET | ORAL | Status: DC

## 2012-05-05 MED ORDER — ALLOPURINOL 300 MG PO TABS: ORAL_TABLET | ORAL | Status: DC

## 2012-05-05 MED ORDER — METOPROLOL TARTRATE 50 MG PO TABS: ORAL_TABLET | ORAL | Status: DC

## 2012-05-05 MED ORDER — FLUTICASONE PROPIONATE 50 MCG/ACT NA SUSP: 1.0000 | Freq: Two times a day (BID) | NASAL | Status: DC

## 2012-05-05 MED ORDER — FLUTICASONE-SALMETEROL 250-50 MCG/DOSE IN AEPB: INHALATION_SPRAY | RESPIRATORY_TRACT | Status: DC

## 2012-05-05 NOTE — Patient Instructions (Addendum)

## 2012-05-05 NOTE — Progress Notes (Signed)
Subjective:    Patient ID: Reginald Green, male    DOB: 1945/09/21, 67 y.o.   MRN: [id number]  HPI Medicare Wellness Visit:  Psychosocial & medical history were reviewed as required by Medicare (abuse,antisocial behavioral risks,firearm risk).  Social history: caffeine: 1 cup/day , alcohol: 14 cans beer / week  ,  tobacco use:  Quit 1989  Exercise : walks 2.5- 3.5  over 45 min 3X/week No home & personal  safety / fall risk Activities of daily living: no limitations  Seatbelt  and smoke alarm employed. Power of Attorney/Living Will status : in place Ophthalmology exam current Hearing evaluation not current Orientation :oriented X 3  Memory & recall :good Spelling  testing:good Mood & affect : normal . Depression / anxiety: denied Travel history : last  [place] 2010 Immunization status : PNA given Transfusion history:  none  Preventive health surveillance ( colonoscopy as per protocol/ SOC): current / colonoscopy / mammogram /BMD/ PAP due Dental care:  Every 6 mos. Chart reviewed &  Updated. Active issues reviewed & addressed.      Review of Systems CHRONIC HYPERTENSION follow-up:  Home blood pressure  average 120s/70s  Patient is compliant with medications  Adverse effects noted from medication of dizziness & vertigo, resolved with decrease in ACE-I doae  No specific dietary program but  heart healthy  & low-salt diet   No chest pain, palpitations, dyspnea, claudication,edema or paroxysmal nocturnal dyspnea described  No significant lightheadedness since  med adjustment. No headache, epistaxis, or syncope         Objective:   Physical Exam Gen.: Healthy and well-nourished in appearance. Alert, appropriate and cooperative throughout exam.  Head: Normocephalic without obvious abnormalities.  Beard & moustache ; no alopecia  Eyes: No corneal or conjunctival inflammation noted.  Extraocular motion intact. Vision grossly normal without lenses Ears: External  ear exam  reveals no significant lesions or deformities. Canals clear .TMs normal. Hearing is grossly normal bilaterally. Nose: External nasal exam reveals no deformity or inflammation. Nasal mucosa are pink and moist. No lesions or exudates noted.  Mouth: Oral mucosa and oropharynx reveal no lesions or exudates. Teeth in good repair. Neck: No deformities, masses, or tenderness noted. Range of motion & Thyroid normal. Lungs: Normal respiratory effort; chest expands symmetrically. Lungs are clear to auscultation without rales, wheezes, or increased work of breathing. Heart: Slow rate and irregular rhythm.  No gallop, click, or rub. No murmur. Abdomen: Bowel sounds normal; abdomen soft and nontender. No masses, organomegaly or hernias noted. Genitalia: Genitalia normal except for left varices & R partially descended testicle. Prostate is normal without enlargement, asymmetry, nodularity, or induration.                Musculoskeletal/extremities: There is some asymmetry of the posterior thoracic musculature suggesting occult scoliosis. No clubbing, cyanosis, edema, or significant extremity  deformity noted. Range of motion normal .Tone & strength  Normal. Joints normal . Nail health good. Able to lie down & sit up w/o help. Negative SLR bilaterally Vascular: Carotid, radial artery, dorsalis pedis and  posterior tibial pulses are full and equal. No bruits present. Neurologic: Alert and oriented x3. Deep tendon reflexes symmetrical and normal.        Skin: Intact without suspicious lesions or rashes. Lymph: No cervical, axillary, or inguinal lymphadenopathy present. Psych: Mood and affect are normal. Normally interactive  Assessment & Plan:  #1 Medicare Wellness Exam; criteria met ; data entered #2 Problem List reviewed ; Assessment/ Recommendations made Plan: see Orders

## 2012-05-08 ENCOUNTER — Encounter: Payer: Self-pay | Admitting: Internal Medicine

## 2012-05-08 NOTE — Telephone Encounter (Signed)
Colonoscopy updated

## 2012-05-13 ENCOUNTER — Encounter: Payer: Self-pay | Admitting: Internal Medicine

## 2012-05-20 ENCOUNTER — Ambulatory Visit (INDEPENDENT_AMBULATORY_CARE_PROVIDER_SITE_OTHER): Payer: 59 | Admitting: General Practice

## 2012-05-20 ENCOUNTER — Encounter: Payer: Self-pay | Admitting: Internal Medicine

## 2012-05-20 DIAGNOSIS — I4891 Unspecified atrial fibrillation: Secondary | ICD-10-CM

## 2012-05-25 ENCOUNTER — Encounter: Payer: Self-pay | Admitting: General Practice

## 2012-06-02 ENCOUNTER — Other Ambulatory Visit (INDEPENDENT_AMBULATORY_CARE_PROVIDER_SITE_OTHER): Payer: 59

## 2012-06-02 LAB — HEMOGLOBIN A1C: Hgb A1c MFr Bld: 5.2 % (ref 4.6–6.5)

## 2012-06-23 ENCOUNTER — Encounter: Payer: Self-pay | Admitting: Internal Medicine

## 2012-06-25 ENCOUNTER — Encounter: Payer: Self-pay | Admitting: Internal Medicine

## 2012-06-25 MED ORDER — ZOSTER VACCINE LIVE 19400 UNT/0.65ML ~~LOC~~ SOLR: 0.6500 mL | Freq: Once | SUBCUTANEOUS | Status: DC

## 2012-07-01 ENCOUNTER — Ambulatory Visit (INDEPENDENT_AMBULATORY_CARE_PROVIDER_SITE_OTHER): Payer: 59 | Admitting: General Practice

## 2012-07-01 DIAGNOSIS — I4891 Unspecified atrial fibrillation: Secondary | ICD-10-CM

## 2012-08-07 ENCOUNTER — Ambulatory Visit (INDEPENDENT_AMBULATORY_CARE_PROVIDER_SITE_OTHER): Payer: 59 | Admitting: General Practice

## 2012-08-07 DIAGNOSIS — I4891 Unspecified atrial fibrillation: Secondary | ICD-10-CM

## 2012-08-07 LAB — POCT INR: INR: 2.5

## 2012-09-16 ENCOUNTER — Other Ambulatory Visit: Payer: Self-pay | Admitting: Internal Medicine

## 2012-09-21 ENCOUNTER — Ambulatory Visit (INDEPENDENT_AMBULATORY_CARE_PROVIDER_SITE_OTHER): Payer: 59 | Admitting: Cardiovascular Disease

## 2012-09-21 ENCOUNTER — Ambulatory Visit (INDEPENDENT_AMBULATORY_CARE_PROVIDER_SITE_OTHER): Payer: 59 | Admitting: Pharmacist

## 2012-09-21 ENCOUNTER — Encounter: Payer: Self-pay | Admitting: Cardiovascular Disease

## 2012-09-21 VITALS — BP 128/80 | HR 68 | Ht 75.0 in | Wt 212.8 lb

## 2012-09-21 DIAGNOSIS — I4891 Unspecified atrial fibrillation: Secondary | ICD-10-CM

## 2012-09-21 DIAGNOSIS — R42 Dizziness and giddiness: Secondary | ICD-10-CM

## 2012-09-21 LAB — POCT INR: INR: 2.8

## 2012-09-21 NOTE — Assessment & Plan Note (Signed)
Cholesterol is at goal.  Continue current dose of statin and diet Rx.  No myalgias or side effects.  F/U  LFT's in 6 months. Lab Results  Component Value Date   LDLCALC 61 05/05/2012

## 2012-09-21 NOTE — Assessment & Plan Note (Signed)
Good rate control and anticoagulation.  INR check today in coumadin clinic  Not interested in novel agent

## 2012-09-21 NOTE — Patient Instructions (Signed)
Your physician wants you to follow-up in:   Year with dr Haywood Filler will receive a reminder letter in the mail two months in advance. If you don't receive a letter, please call our office to schedule the follow-up appointment. Your physician recommends that you continue on your current medications as directed. Please refer to the Current Medication list given to you today.

## 2012-09-21 NOTE — Assessment & Plan Note (Signed)
Resolved Not related to heart.  Afib with no long pauses No indication for pacer

## 2012-09-21 NOTE — Progress Notes (Signed)
Patient ID: Reginald Green, male   DOB: 06-09-1945, 67 y.o.   MRN: [id number] Reginald Green is a 67 y.o. male who presents for follow up . He has a history of chronic atrial fibrillation on chronic anticoagulation, hypertension, COPD, gout, hyperlipidemia. He was seen in the emergency room 01/10/11 with chest discomfort and episodic dizziness. He also noted a spinning sensation at times. Cardiac markers were negative in the emergency room. Potassium was 3.9, creatinine 0.9, hemoglobin 14.6. Chest x-ray demonstrated chronically elevated left hemidiaphragm, borderline cardiac enlargement, pulmonary vascularity questionable for developing vascular congestion, no active consolidation. Patient was seen by Dr. Johney Frame. He felt the patient could be evaluated as an outpatient with an echocardiogram, myoview and an event monitor. Echocardiogram 01/15/11 for mild LVH, EF 65%, moderate to severe LAE, moderate RAE. Myoview 01/18/11: No scar or ischemia. Study not gated due to a true fibrillation. I have reviewed the available strips from his monitor and these demonstrate atrial fibrillation without significant pauses or bradycardia.  Saw Dr Modesto Charon and posterior circulation MRI/MRA was fine. Symptoms have not recurred   Event monitor 2/13 with persistant afib. No pauses and reasonable rate control PVC no sustained VT  No complaints Still does not want novel agent   ROS: Denies fever, malais, weight loss, blurry vision, decreased visual acuity, cough, sputum, SOB, hemoptysis, pleuritic pain, palpitaitons, heartburn, abdominal pain, melena, lower extremity edema, claudication, or rash.  All other systems reviewed and negative  General: Affect appropriate Healthy:  appears stated age HEENT: normal Neck supple with no adenopathy JVP normal no bruits no thyromegaly Lungs clear with no wheezing and good diaphragmatic motion Heart:  S1/S2 no murmur, no rub, gallop or click PMI normal Abdomen: benighn, BS positve, no  tenderness, no AAA no bruit.  No HSM or HJR Distal pulses intact with no bruits No edema Neuro non-focal Skin warm and dry No muscular weakness   Current Outpatient Prescriptions  Medication Sig Dispense Refill  . allopurinol (ZYLOPRIM) 300 MG tablet 1/2 by mouth daily  90 tablet  1  . aspirin 81 MG tablet Take 81 mg by mouth daily.        Marland Kitchen atorvastatin (LIPITOR) 20 MG tablet 1 tablet mon ,wed, fri, sunday  90 tablet  1  . fluticasone (FLONASE) 50 MCG/ACT nasal spray Place 1 spray into the nose 2 (two) times daily.  1 g  11  . Fluticasone-Salmeterol (ADVAIR DISKUS) 250-50 MCG/DOSE AEPB inhale 1 puff by mouth every 12 hours  60 each  11  . lisinopril (PRINIVIL,ZESTRIL) 20 MG tablet Take 0.5 tablets (10 mg total) by mouth daily.  30 tablet  11  . metoprolol (LOPRESSOR) 50 MG tablet 1/2 by mouth twice daily  90 tablet  3  . Multiple Vitamin (MULTIVITAMIN) tablet Take 1 tablet by mouth daily.        Marland Kitchen warfarin (COUMADIN) 5 MG tablet Take as directed by anticoagulation clinic  120 tablet  0  . zoster vaccine live, PF, (ZOSTAVAX) 10272 UNT/0.65ML injection Inject 19,400 Units into the skin once.  1 each  0   No current facility-administered medications for this visit.    Allergies  Review of patient's allergies indicates no known allergies.  Electrocardiogram:  afib rate 62 othewise normal   Assessment and Plan

## 2012-10-02 ENCOUNTER — Telehealth: Payer: Self-pay

## 2012-10-02 ENCOUNTER — Other Ambulatory Visit: Payer: Self-pay | Admitting: Physician Assistant

## 2012-10-02 NOTE — Telephone Encounter (Signed)
error 

## 2012-10-27 ENCOUNTER — Encounter: Payer: Self-pay | Admitting: Internal Medicine

## 2012-11-03 ENCOUNTER — Ambulatory Visit (INDEPENDENT_AMBULATORY_CARE_PROVIDER_SITE_OTHER): Payer: 59 | Admitting: General Practice

## 2012-11-03 DIAGNOSIS — I4891 Unspecified atrial fibrillation: Secondary | ICD-10-CM

## 2012-11-03 LAB — POCT INR: INR: 2.9

## 2012-11-12 ENCOUNTER — Other Ambulatory Visit: Payer: Self-pay

## 2012-12-16 ENCOUNTER — Ambulatory Visit (INDEPENDENT_AMBULATORY_CARE_PROVIDER_SITE_OTHER): Payer: 59 | Admitting: General Practice

## 2012-12-16 DIAGNOSIS — I4891 Unspecified atrial fibrillation: Secondary | ICD-10-CM

## 2012-12-16 NOTE — Progress Notes (Signed)
Pre-visit discussion using our clinic review tool. No additional management support is needed unless otherwise documented below in the visit note.  

## 2012-12-24 ENCOUNTER — Other Ambulatory Visit: Payer: Self-pay | Admitting: Internal Medicine

## 2012-12-24 NOTE — Telephone Encounter (Signed)
Warfarin refilled per protocol. JG//CMA 

## 2013-01-13 ENCOUNTER — Other Ambulatory Visit: Payer: Self-pay | Admitting: Internal Medicine

## 2013-01-13 NOTE — Telephone Encounter (Signed)
Allopurinol refilled per protocol. JG//CMA 

## 2013-01-26 ENCOUNTER — Ambulatory Visit (INDEPENDENT_AMBULATORY_CARE_PROVIDER_SITE_OTHER): Payer: 59 | Admitting: General Practice

## 2013-01-26 DIAGNOSIS — I4891 Unspecified atrial fibrillation: Secondary | ICD-10-CM

## 2013-01-26 LAB — POCT INR: INR: 3.5

## 2013-01-26 NOTE — Progress Notes (Signed)
Pre-visit discussion using our clinic review tool. No additional management support is needed unless otherwise documented below in the visit note.  

## 2013-01-30 ENCOUNTER — Other Ambulatory Visit: Payer: Self-pay | Admitting: Internal Medicine

## 2013-02-01 NOTE — Telephone Encounter (Signed)
Atorvastatin refilled per protocol. JG//CMA 

## 2013-02-26 ENCOUNTER — Ambulatory Visit (INDEPENDENT_AMBULATORY_CARE_PROVIDER_SITE_OTHER): Payer: 59 | Admitting: General Practice

## 2013-02-26 DIAGNOSIS — Z5181 Encounter for therapeutic drug level monitoring: Secondary | ICD-10-CM

## 2013-02-26 DIAGNOSIS — I4891 Unspecified atrial fibrillation: Secondary | ICD-10-CM

## 2013-02-26 LAB — POCT INR: INR: 3.5

## 2013-02-26 NOTE — Progress Notes (Signed)
Pre visit review using our clinic review tool, if applicable. No additional management support is needed unless otherwise documented below in the visit note. 

## 2013-03-26 ENCOUNTER — Ambulatory Visit (INDEPENDENT_AMBULATORY_CARE_PROVIDER_SITE_OTHER): Payer: 59 | Admitting: General Practice

## 2013-03-26 DIAGNOSIS — I4891 Unspecified atrial fibrillation: Secondary | ICD-10-CM

## 2013-03-26 DIAGNOSIS — Z5181 Encounter for therapeutic drug level monitoring: Secondary | ICD-10-CM

## 2013-03-26 LAB — POCT INR: INR: 2.3

## 2013-03-26 NOTE — Progress Notes (Signed)
Pre visit review using our clinic review tool, if applicable. No additional management support is needed unless otherwise documented below in the visit note. 

## 2013-04-14 ENCOUNTER — Other Ambulatory Visit: Payer: Self-pay | Admitting: Internal Medicine

## 2013-04-16 ENCOUNTER — Other Ambulatory Visit: Payer: Self-pay | Admitting: General Practice

## 2013-04-16 MED ORDER — WARFARIN SODIUM 5 MG PO TABS: ORAL_TABLET | ORAL | Status: DC

## 2013-04-23 ENCOUNTER — Ambulatory Visit (INDEPENDENT_AMBULATORY_CARE_PROVIDER_SITE_OTHER): Payer: 59 | Admitting: General Practice

## 2013-04-23 DIAGNOSIS — I4891 Unspecified atrial fibrillation: Secondary | ICD-10-CM

## 2013-04-23 DIAGNOSIS — Z5181 Encounter for therapeutic drug level monitoring: Secondary | ICD-10-CM

## 2013-04-23 LAB — POCT INR: INR: 2.2

## 2013-04-23 NOTE — Progress Notes (Signed)
Pre visit review using our clinic review tool, if applicable. No additional management support is needed unless otherwise documented below in the visit note. 

## 2013-04-29 ENCOUNTER — Other Ambulatory Visit: Payer: Self-pay | Admitting: Internal Medicine

## 2013-05-07 ENCOUNTER — Ambulatory Visit (INDEPENDENT_AMBULATORY_CARE_PROVIDER_SITE_OTHER): Payer: 59 | Admitting: Internal Medicine

## 2013-05-07 ENCOUNTER — Other Ambulatory Visit (INDEPENDENT_AMBULATORY_CARE_PROVIDER_SITE_OTHER): Payer: 59

## 2013-05-07 ENCOUNTER — Encounter: Payer: Self-pay | Admitting: Internal Medicine

## 2013-05-07 VITALS — BP 110/80 | HR 72 | Temp 97.4°F | Resp 14 | Ht 75.0 in | Wt 207.4 lb

## 2013-05-07 DIAGNOSIS — J45909 Unspecified asthma, uncomplicated: Secondary | ICD-10-CM

## 2013-05-07 DIAGNOSIS — M109 Gout, unspecified: Secondary | ICD-10-CM

## 2013-05-07 DIAGNOSIS — Z0000 Encounter for general adult medical examination without abnormal findings: Secondary | ICD-10-CM

## 2013-05-07 DIAGNOSIS — E785 Hyperlipidemia, unspecified: Secondary | ICD-10-CM

## 2013-05-07 DIAGNOSIS — I10 Essential (primary) hypertension: Secondary | ICD-10-CM

## 2013-05-07 LAB — URIC ACID: URIC ACID, SERUM: 6.1 mg/dL (ref 4.0–7.8)

## 2013-05-07 LAB — BASIC METABOLIC PANEL
BUN: 17 mg/dL (ref 6–23)
CO2: 26 meq/L (ref 19–32)
CREATININE: 0.9 mg/dL (ref 0.4–1.5)
Calcium: 9.1 mg/dL (ref 8.4–10.5)
Chloride: 104 mEq/L (ref 96–112)
GFR: 88.56 mL/min (ref >60.00)
Glucose, Bld: 87 mg/dL (ref 70–99)
Potassium: 4.2 mEq/L (ref 3.5–5.1)
Sodium: 137 mEq/L (ref 135–145)

## 2013-05-07 LAB — HEPATIC FUNCTION PANEL
ALBUMIN: 3.9 g/dL (ref 3.5–5.2)
ALK PHOS: 51 U/L (ref 39–117)
ALT: 28 U/L (ref 0–53)
AST: 32 U/L (ref 0–37)
BILIRUBIN DIRECT: 0.3 mg/dL (ref 0.0–0.3)
TOTAL PROTEIN: 6.8 g/dL (ref 6.0–8.3)
Total Bilirubin: 1.5 mg/dL — ABNORMAL HIGH (ref 0.3–1.2)

## 2013-05-07 LAB — LIPID PANEL
CHOL/HDL RATIO: 2
Cholesterol: 135 mg/dL (ref 0–200)
HDL: 64.1 mg/dL (ref >39.00)
LDL Cholesterol: 62 mg/dL (ref 0–99)
Triglycerides: 45 mg/dL (ref 0.0–149.0)
VLDL: 9 mg/dL (ref 0.0–40.0)

## 2013-05-07 LAB — TSH: TSH: 0.99 u[IU]/mL (ref 0.35–5.50)

## 2013-05-07 NOTE — Assessment & Plan Note (Signed)
Infrequent ; last Spring 2014 Check uric acid

## 2013-05-07 NOTE — Assessment & Plan Note (Signed)
Lipids, LFTs, TSH  

## 2013-05-07 NOTE — Assessment & Plan Note (Signed)
Excellent control with Advair; no rescue inhaler use

## 2013-05-07 NOTE — Patient Instructions (Signed)
Your next office appointment will be determined based upon review of your pending labs. Those instructions will be transmitted to you through My Chart . 

## 2013-05-07 NOTE — Assessment & Plan Note (Addendum)
As per Dr Eden EmmsNishan [facility]

## 2013-05-07 NOTE — Progress Notes (Signed)
Pre visit review using our clinic review tool, if applicable. No additional management support is needed unless otherwise documented below in the visit note. 

## 2013-05-07 NOTE — Assessment & Plan Note (Signed)
BP @ home < 135/85 Blood pressure goals reviewed. BMET

## 2013-05-07 NOTE — Progress Notes (Signed)
Subjective:    Patient ID: IRBY FAILS, male    DOB: July 31, 1945, 68 y.o.   MRN: [id number]  HPI Medicare Wellness Visit:([employer]) Psychosocial and medical history were reviewed as required by Medicare (history related to abuse, antisocial behavior , firearm risk). Social history: Caffeine: 1 cup & 2-3 glasses tea/day, Alcohol: 14 beers / week, Tobacco XHF:SFSE 1989 Exercise:walking 3 X/day 4 mpd & 2.5 -3 mpd 3 X/ week Personal safety/fall risk: no Limitations of activities of daily living: no Seatbelt/ smoke alarm use:yes Healthcare Power of Attorney/Living Will status: UTD Ophthalmologic exam status:not UTD Hearing evaluation status:not UTD Orientation: Oriented X 3 Memory and recall: good Spelling  testing: good Depression/anxiety assessment: no Foreign travel history: 2005 [place] Immunization status for influenza/pneumonia/ shingles /tetanus: UTD Transfusion history:no Preventive health care maintenance status: Colonoscopy as per protocol/standard care:UTD Dental care:every 6 mos  Chart reviewed and updated. Active issues reviewed and addressed as documented below.  A heart healthy diet is followed; exercise as noted above without symptoms.  Family history is borderline ( [place] @ 53) for premature coronary disease. Advanced cholesterol testing reveals  LDL goal is less than 120, ideally < 90 . There is medication compliance with the statin.  Low dose ASA taken   Review of Systems Specifically denied are  chest pain, palpitations, dyspnea, or claudication.  Significant abdominal symptoms, memory deficit, or myalgias not present. Asthma stable ; no rescue needed       Objective:   Physical Exam Gen.: Healthy and well-nourished in appearance. Alert, appropriate and cooperative throughout exam. Appears younger than stated age  Head: Normocephalic without obvious abnormalities; no alopecia. Beard & moustache  Eyes: No corneal or conjunctival inflammation noted. Pupils  equal round reactive to light and accommodation. Extraocular motion intact.  Ears: External  ear exam reveals no significant lesions or deformities. Canals clear .TMs dull. Hearing is grossly normal bilaterally. Nose: External nasal exam reveals no deformity or inflammation. Nasal mucosa are pink and moist. No lesions or exudates noted.   Mouth: Oral mucosa and oropharynx reveal no lesions or exudates. Teeth in good repair. Neck: No deformities, masses, or tenderness noted. Range of motion &. Thyroid normal. Lungs: Normal respiratory effort; chest expands symmetrically. Lungs are clear to auscultation without rales, wheezes, or increased work of breathing. Heart: Slow regular rate and rhythm. Normal S1 and S2. No gallop, click, or rub. No murmur. Abdomen: Bowel sounds normal; abdomen soft and nontender. No masses, organomegaly or hernias noted. Genitalia: Genitalia normal except for left varices. Prostate is normal without enlargement, asymmetry, nodularity, or induration                                 Musculoskeletal/extremities: No deformity or scoliosis noted of  the thoracic or lumbar spine.   No clubbing, cyanosis, edema, or significant extremity  deformity noted. Range of motion normal .Tone & strength normal. Hand joints normal  Fingernail  health good. Able to lie down & sit up w/o help. Negative SLR bilaterally Vascular: Carotid, radial artery, dorsalis pedis and  posterior tibial pulses are full and equal. No bruits present. Neurologic: Alert and oriented x3. Deep tendon reflexes symmetrical and normal.  Gait normal . Skin: Intact without suspicious lesions or rashes. Lymph: No cervical, axillary, or inguinal lymphadenopathy present. Psych: Mood and affect are normal. Normally interactive  Assessment & Plan:  #1 Medicare Wellness Exam; criteria met ; data entered Plan:  Assessments made/  Orders entered See Current Assessment & Plan in Problem List under specific DiagnosisThe labs will be reviewed and risks and options assessed. Written recommendations will be provided by mail or directly through My Chart.Further evaluation or change in medical therapy will be directed by those results.

## 2013-05-28 ENCOUNTER — Ambulatory Visit (INDEPENDENT_AMBULATORY_CARE_PROVIDER_SITE_OTHER): Payer: 59 | Admitting: General Practice

## 2013-05-28 DIAGNOSIS — I4891 Unspecified atrial fibrillation: Secondary | ICD-10-CM

## 2013-05-28 DIAGNOSIS — Z5181 Encounter for therapeutic drug level monitoring: Secondary | ICD-10-CM

## 2013-05-28 LAB — POCT INR: INR: 2.3

## 2013-05-28 NOTE — Progress Notes (Signed)
Pre visit review using our clinic review tool, if applicable. No additional management support is needed unless otherwise documented below in the visit note. 

## 2013-05-31 ENCOUNTER — Other Ambulatory Visit: Payer: Self-pay | Admitting: Internal Medicine

## 2013-06-01 ENCOUNTER — Other Ambulatory Visit: Payer: Self-pay | Admitting: Internal Medicine

## 2013-06-30 ENCOUNTER — Other Ambulatory Visit: Payer: Self-pay | Admitting: Internal Medicine

## 2013-07-02 ENCOUNTER — Ambulatory Visit (INDEPENDENT_AMBULATORY_CARE_PROVIDER_SITE_OTHER): Payer: 59 | Admitting: General Practice

## 2013-07-02 DIAGNOSIS — I4891 Unspecified atrial fibrillation: Secondary | ICD-10-CM

## 2013-07-02 DIAGNOSIS — Z5181 Encounter for therapeutic drug level monitoring: Secondary | ICD-10-CM

## 2013-07-02 LAB — POCT INR: INR: 2

## 2013-07-02 NOTE — Progress Notes (Signed)
Pre visit review using our clinic review tool, if applicable. No additional management support is needed unless otherwise documented below in the visit note. 

## 2013-07-08 ENCOUNTER — Other Ambulatory Visit: Payer: Self-pay | Admitting: Internal Medicine

## 2013-08-24 ENCOUNTER — Ambulatory Visit (INDEPENDENT_AMBULATORY_CARE_PROVIDER_SITE_OTHER): Payer: 59 | Admitting: *Deleted

## 2013-08-24 DIAGNOSIS — I4891 Unspecified atrial fibrillation: Secondary | ICD-10-CM

## 2013-08-24 DIAGNOSIS — Z5181 Encounter for therapeutic drug level monitoring: Secondary | ICD-10-CM

## 2013-08-24 LAB — POCT INR: INR: 2.2

## 2013-09-27 ENCOUNTER — Ambulatory Visit (INDEPENDENT_AMBULATORY_CARE_PROVIDER_SITE_OTHER): Payer: 59 | Admitting: Pharmacist

## 2013-09-27 ENCOUNTER — Ambulatory Visit (INDEPENDENT_AMBULATORY_CARE_PROVIDER_SITE_OTHER): Payer: 59 | Admitting: Cardiovascular Disease

## 2013-09-27 VITALS — BP 124/72 | HR 55 | Ht 75.0 in | Wt 206.8 lb

## 2013-09-27 DIAGNOSIS — I482 Chronic atrial fibrillation, unspecified: Secondary | ICD-10-CM

## 2013-09-27 DIAGNOSIS — Z5181 Encounter for therapeutic drug level monitoring: Secondary | ICD-10-CM

## 2013-09-27 DIAGNOSIS — I10 Essential (primary) hypertension: Secondary | ICD-10-CM

## 2013-09-27 DIAGNOSIS — I4891 Unspecified atrial fibrillation: Secondary | ICD-10-CM

## 2013-09-27 DIAGNOSIS — E785 Hyperlipidemia, unspecified: Secondary | ICD-10-CM

## 2013-09-27 DIAGNOSIS — R42 Dizziness and giddiness: Secondary | ICD-10-CM

## 2013-09-27 LAB — POCT INR: INR: 2.5

## 2013-09-27 NOTE — Progress Notes (Signed)
Patient ID: Reginald Green, male   DOB: [birth year], 68 y.o.   MRN: [id number] Reginald Green is a 68 y.o. male who presents for follow up . He has a history of chronic atrial fibrillation on chronic anticoagulation, hypertension, COPD, gout, hyperlipidemia. He was seen in the emergency room 01/10/11 with chest discomfort and episodic dizziness. He also noted a spinning sensation at times. Cardiac markers were negative in the emergency room. Potassium was 3.9, creatinine 0.9, hemoglobin 14.6. Chest x-ray demonstrated chronically elevated left hemidiaphragm, borderline cardiac enlargement, pulmonary vascularity questionable for developing vascular congestion, no active consolidation. Patient was seen by Dr. Johney Frame. He felt the patient could be evaluated as an outpatient with an echocardiogram, myoview and an event monitor. Echocardiogram 01/15/11 for mild LVH, EF 65%, moderate to severe LAE, moderate RAE. Myoview 01/18/11: No scar or ischemia. Study not gated due to a true fibrillation. I have reviewed the available strips from his monitor and these demonstrate atrial fibrillation without significant pauses or bradycardia.  Saw Dr Modesto Charon and posterior circulation MRI/MRA was fine. Symptoms have not recurred  Event monitor 2/13 with persistant afib. No pauses and reasonable rate control PVC no sustained VT  No complaints Still does not want novel agent   5/15  Cr and K normal labs reviewed   Cost wise he still prefers to be on coumadin   ROS: Denies fever, malais, weight loss, blurry vision, decreased visual acuity, cough, sputum, SOB, hemoptysis, pleuritic pain, palpitaitons, heartburn, abdominal pain, melena, lower extremity edema, claudication, or rash.  All other systems reviewed and negative  General: Affect appropriate Healthy:  appears stated age HEENT: normal Neck supple with no adenopathy JVP normal no bruits no thyromegaly Lungs clear with no wheezing and good diaphragmatic motion Heart:  S1/S2  no murmur, no rub, gallop or click PMI normal Abdomen: benighn, BS positve, no tenderness, no AAA no bruit.  No HSM or HJR Distal pulses intact with no bruits No edema Neuro non-focal Skin warm and dry  Bruising on back of hands  No muscular weakness   Current Outpatient Prescriptions  Medication Sig Dispense Refill  . ADVAIR DISKUS 250-50 MCG/DOSE AEPB INHALE 1 PUFF BY MOUTH EVERY 12 HOURS.  **DUE FOR FOLLOW-UP VISIT WITH DR HOPPER**  60 each  5  . allopurinol (ZYLOPRIM) 300 MG tablet TAKE 1/2 TABLET BY MOUTH DAILY  45 tablet  3  . aspirin 81 MG tablet Take 81 mg by mouth daily.        Marland Kitchen atorvastatin (LIPITOR) 20 MG tablet TAKE 1 TABLET BY MOUTH ON MONDAY, WEDNESDAY, FRIDAY, AND SUNDAY  51 tablet  3  . fluticasone (FLONASE) 50 MCG/ACT nasal spray Place 1 spray into the nose 2 (two) times daily.  1 g  11  . fluticasone (FLONASE) 50 MCG/ACT nasal spray SPRAY 1 SPRAY INTO THE NOSE 2 (TWO) TIMES DAILY.  16 g  11  . Fluticasone-Salmeterol (ADVAIR DISKUS) 250-50 MCG/DOSE AEPB Inhale 1 puff into the lungs every 12 (twelve) hours.  60 each  11  . Fluticasone-Salmeterol (ADVAIR DISKUS) 250-50 MCG/DOSE AEPB Inhale 1 puff into the lungs every 12 (twelve) hours.  60 each  0  . lisinopril (PRINIVIL,ZESTRIL) 20 MG tablet TAKE 1/2 TABLET DAILY  30 tablet  10  . metoprolol (LOPRESSOR) 50 MG tablet 1/2 by mouth twice daily  90 tablet  3  . metoprolol (LOPRESSOR) 50 MG tablet TAKE 1/2 TABLET BY MOUTH TWICE DAILY WITH FOOD  90 tablet  3  . Multiple Vitamin (  MULTIVITAMIN) tablet Take 1 tablet by mouth daily.        Marland Kitchen warfarin (COUMADIN) 5 MG tablet Take as directed by anticoagulation clinic  105 tablet  3   No current facility-administered medications for this visit.    Allergies  Review of patient's allergies indicates no known allergies.  Electrocardiogram:  afib otherwise normal ECG 2014  Today Afib rate 55 othewise normal   Assessment and Plan

## 2013-09-27 NOTE — Patient Instructions (Signed)
Your physician wants you to follow-up in: YEAR WITH DR NISHAN  You will receive a reminder letter in the mail two months in advance. If you don't receive a letter, please call our office to schedule the follow-up appointment.  Your physician recommends that you continue on your current medications as directed. Please refer to the Current Medication list given to you today. 

## 2013-09-27 NOTE — Assessment & Plan Note (Signed)
Well controlled.  Continue current medications and low sodium Dash type diet.    

## 2013-09-27 NOTE — Assessment & Plan Note (Signed)
Good rate control and anticoagulaiton

## 2013-09-27 NOTE — Assessment & Plan Note (Signed)
Cholesterol is at goal.  Continue current dose of statin and diet Rx.  No myalgias or side effects.  F/U  LFT's in 6 months. Lab Results  Component Value Date   LDLCALC 62 05/07/2013

## 2013-09-27 NOTE — Assessment & Plan Note (Signed)
Resolved non cardiac in nature

## 2013-11-08 ENCOUNTER — Telehealth: Payer: Self-pay | Admitting: Internal Medicine

## 2013-11-08 NOTE — Telephone Encounter (Signed)
Patient has an appt scheduled for tomorrow that he had to cancel.  He is requesting to be worked in on a date that does not have a coum clinic schedule.

## 2013-11-08 NOTE — Telephone Encounter (Signed)
Pls get him set-up with [facility] or next available...Raechel Chute/lmb

## 2013-11-17 ENCOUNTER — Ambulatory Visit (INDEPENDENT_AMBULATORY_CARE_PROVIDER_SITE_OTHER): Payer: 59

## 2013-11-17 ENCOUNTER — Other Ambulatory Visit: Payer: Self-pay | Admitting: Certified Registered Nurse Anesthetist

## 2013-11-17 DIAGNOSIS — I4891 Unspecified atrial fibrillation: Secondary | ICD-10-CM

## 2013-11-17 DIAGNOSIS — Z5181 Encounter for therapeutic drug level monitoring: Secondary | ICD-10-CM

## 2013-11-17 LAB — POCT INR: INR: 2.2

## 2013-11-29 ENCOUNTER — Other Ambulatory Visit: Payer: Self-pay | Admitting: Cardiovascular Disease

## 2013-11-30 ENCOUNTER — Other Ambulatory Visit: Payer: Self-pay | Admitting: Cardiovascular Disease

## 2013-12-01 ENCOUNTER — Other Ambulatory Visit: Payer: Self-pay | Admitting: Cardiovascular Disease

## 2013-12-01 NOTE — Telephone Encounter (Signed)
Reginald StadePeter C Nishan, MD at 09/27/2013 8:04 AM  lisinopril (PRINIVIL,ZESTRIL) 20 MG tablet TAKE 1/2 TABLET DAILY    Patient Instructions     Your physician wants you to follow-up in: YEAR WITH DR Haywood FillerNISHAN You will receive a reminder letter in the mail two months in advance. If you don't receive a letter, please call our office to schedule the follow-up appointment.  Your physician recommends that you continue on your current medications as directed. Please refer to the Current Medication list given to you today.

## 2013-12-29 ENCOUNTER — Ambulatory Visit (INDEPENDENT_AMBULATORY_CARE_PROVIDER_SITE_OTHER): Payer: 59 | Admitting: Family Medicine

## 2013-12-29 DIAGNOSIS — I4891 Unspecified atrial fibrillation: Secondary | ICD-10-CM

## 2013-12-29 DIAGNOSIS — Z5181 Encounter for therapeutic drug level monitoring: Secondary | ICD-10-CM

## 2013-12-29 LAB — POCT INR: INR: 2.8

## 2014-01-26 ENCOUNTER — Other Ambulatory Visit: Payer: Self-pay | Admitting: Internal Medicine

## 2014-02-01 ENCOUNTER — Ambulatory Visit (INDEPENDENT_AMBULATORY_CARE_PROVIDER_SITE_OTHER): Payer: Medicare Other

## 2014-02-01 DIAGNOSIS — I4891 Unspecified atrial fibrillation: Secondary | ICD-10-CM

## 2014-02-01 DIAGNOSIS — Z5181 Encounter for therapeutic drug level monitoring: Secondary | ICD-10-CM

## 2014-02-01 LAB — POCT INR: INR: 2.1

## 2014-03-15 ENCOUNTER — Ambulatory Visit (INDEPENDENT_AMBULATORY_CARE_PROVIDER_SITE_OTHER): Payer: Medicare Other | Admitting: General Practice

## 2014-03-15 DIAGNOSIS — Z5181 Encounter for therapeutic drug level monitoring: Secondary | ICD-10-CM

## 2014-03-15 DIAGNOSIS — I4891 Unspecified atrial fibrillation: Secondary | ICD-10-CM

## 2014-03-15 LAB — POCT INR: INR: 2.5

## 2014-03-15 NOTE — Progress Notes (Signed)
Agree with plan 

## 2014-03-15 NOTE — Progress Notes (Signed)
Pre visit review using our clinic review tool, if applicable. No additional management support is needed unless otherwise documented below in the visit note. 

## 2014-04-27 ENCOUNTER — Ambulatory Visit (INDEPENDENT_AMBULATORY_CARE_PROVIDER_SITE_OTHER): Payer: Medicare Other | Admitting: General Practice

## 2014-04-27 DIAGNOSIS — I4891 Unspecified atrial fibrillation: Secondary | ICD-10-CM

## 2014-04-27 DIAGNOSIS — Z5181 Encounter for therapeutic drug level monitoring: Secondary | ICD-10-CM

## 2014-04-27 LAB — POCT INR: INR: 2.3

## 2014-04-27 NOTE — Progress Notes (Signed)
Agree with plan 

## 2014-04-27 NOTE — Progress Notes (Signed)
Pre visit review using our clinic review tool, if applicable. No additional management support is needed unless otherwise documented below in the visit note. 

## 2014-05-09 ENCOUNTER — Other Ambulatory Visit: Payer: Self-pay | Admitting: Internal Medicine

## 2014-05-09 ENCOUNTER — Encounter: Payer: Self-pay | Admitting: Internal Medicine

## 2014-05-09 ENCOUNTER — Other Ambulatory Visit (INDEPENDENT_AMBULATORY_CARE_PROVIDER_SITE_OTHER): Payer: Medicare Other

## 2014-05-09 ENCOUNTER — Ambulatory Visit (INDEPENDENT_AMBULATORY_CARE_PROVIDER_SITE_OTHER): Payer: Medicare Other | Admitting: Internal Medicine

## 2014-05-09 VITALS — BP 140/88 | HR 68 | Temp 97.9°F | Resp 15 | Ht 75.0 in | Wt 212.0 lb

## 2014-05-09 DIAGNOSIS — Z8739 Personal history of other diseases of the musculoskeletal system and connective tissue: Secondary | ICD-10-CM

## 2014-05-09 DIAGNOSIS — Z0000 Encounter for general adult medical examination without abnormal findings: Secondary | ICD-10-CM

## 2014-05-09 DIAGNOSIS — E785 Hyperlipidemia, unspecified: Secondary | ICD-10-CM

## 2014-05-09 DIAGNOSIS — Z8639 Personal history of other endocrine, nutritional and metabolic disease: Secondary | ICD-10-CM

## 2014-05-09 DIAGNOSIS — T148 Other injury of unspecified body region: Secondary | ICD-10-CM

## 2014-05-09 DIAGNOSIS — Z23 Encounter for immunization: Secondary | ICD-10-CM | POA: Diagnosis not present

## 2014-05-09 DIAGNOSIS — T148XXA Other injury of unspecified body region, initial encounter: Secondary | ICD-10-CM

## 2014-05-09 DIAGNOSIS — I10 Essential (primary) hypertension: Secondary | ICD-10-CM

## 2014-05-09 LAB — BASIC METABOLIC PANEL
BUN: 19 mg/dL (ref 6–23)
CHLORIDE: 105 meq/L (ref 96–112)
CO2: 27 mEq/L (ref 19–32)
Calcium: 9.6 mg/dL (ref 8.4–10.5)
Creatinine, Ser: 1.18 mg/dL (ref 0.40–1.50)
GFR: 65.41 mL/min (ref >60.00)
Glucose, Bld: 94 mg/dL (ref 70–99)
POTASSIUM: 4.3 meq/L (ref 3.5–5.1)
SODIUM: 137 meq/L (ref 135–145)

## 2014-05-09 LAB — CBC WITH DIFFERENTIAL/PLATELET
Basophils Absolute: 0.1 10*3/uL (ref 0.0–0.1)
Basophils Relative: 1 % (ref 0.0–3.0)
EOS ABS: 0.1 10*3/uL (ref 0.0–0.7)
Eosinophils Relative: 1.2 % (ref 0.0–5.0)
HCT: 45.8 % (ref 39.0–52.0)
Hemoglobin: 15.9 g/dL (ref 13.0–17.0)
LYMPHS PCT: 18.1 % (ref 12.0–46.0)
Lymphs Abs: 1.7 10*3/uL (ref 0.7–4.0)
MCHC: 34.8 g/dL (ref 30.0–36.0)
MCV: 98.1 fl (ref 78.0–100.0)
MONO ABS: 1 10*3/uL (ref 0.1–1.0)
Monocytes Relative: 10.7 % (ref 3.0–12.0)
NEUTROS ABS: 6.3 10*3/uL (ref 1.4–7.7)
NEUTROS PCT: 69 % (ref 43.0–77.0)
Platelets: 238 10*3/uL (ref 150.0–400.0)
RBC: 4.67 Mil/uL (ref 4.22–5.81)
RDW: 12.7 % (ref 11.5–15.5)
WBC: 9.2 10*3/uL (ref 4.0–10.5)

## 2014-05-09 LAB — HEPATIC FUNCTION PANEL
ALBUMIN: 3.9 g/dL (ref 3.5–5.2)
ALT: 19 U/L (ref 0–53)
AST: 24 U/L (ref 0–37)
Alkaline Phosphatase: 58 U/L (ref 39–117)
BILIRUBIN DIRECT: 0.2 mg/dL (ref 0.0–0.3)
TOTAL PROTEIN: 7.2 g/dL (ref 6.0–8.3)
Total Bilirubin: 1.1 mg/dL (ref 0.2–1.2)

## 2014-05-09 LAB — URIC ACID: Uric Acid, Serum: 6.4 mg/dL (ref 4.0–7.8)

## 2014-05-09 LAB — TSH: TSH: 1.37 u[IU]/mL (ref 0.35–4.50)

## 2014-05-09 MED ORDER — METOPROLOL TARTRATE 50 MG PO TABS: ORAL_TABLET | ORAL | Status: DC

## 2014-05-09 MED ORDER — ALLOPURINOL 300 MG PO TABS: 150.0000 mg | ORAL_TABLET | Freq: Every day | ORAL | Status: DC

## 2014-05-09 MED ORDER — LISINOPRIL 20 MG PO TABS: 10.0000 mg | ORAL_TABLET | Freq: Every day | ORAL | Status: DC

## 2014-05-09 MED ORDER — ATORVASTATIN CALCIUM 20 MG PO TABS: ORAL_TABLET | ORAL | Status: DC

## 2014-05-09 NOTE — Progress Notes (Signed)
Pre visit review using our clinic review tool, if applicable. No additional management support is needed unless otherwise documented below in the visit note. 

## 2014-05-09 NOTE — Assessment & Plan Note (Addendum)
PSA not indicated by exam or prior PSA

## 2014-05-09 NOTE — Assessment & Plan Note (Signed)
Blood pressure goals reviewed. BMET 

## 2014-05-09 NOTE — Progress Notes (Signed)
Subjective:    Patient ID: Reginald Green, male    DOB: Aug 14, 1945, 69 y.o.   MRN: [id number]  HPI  Medicare Wellness Visit: Psychosocial and medical history were reviewed as required by Medicare (history related to abuse, antisocial behavior , firearm risk). Social history: Caffeine:  2cups coffee/day ; 3-4 glasses tea Alcohol: 2  beers / day Tobacco SVX:BLTJ 1989 Exercise: walking 3 X / week 2-2.5 mpd w/o symptoms Personal safety/fall risk:no Limitations of activities of daily living:no Seatbelt/ smoke alarm use:yes Healthcare Power of Attorney/Living Will status and End of Life process assessment : UTD Ophthalmologic exam status:not UTD;last 2014 Hearing evaluation status:not UTD Orientation: Oriented X 3 Memory and recall: good Spelling testing: good Depression/anxiety assessment: no Foreign travel [place] Immunization status for influenza/pneumonia/ shingles /tetanus:Prevnar today Transfusion history:no Preventive health care maintenance status: Colonoscopy as per protocol/standard care:due 2021 Dental care: 01/2014 Chart reviewed and updated. Active issues reviewed and addressed as documented below.  He has been compliant with medications without adverse effects  He is on a heart healthy diet; he also restricts salt. He does not ingest organ meats, shellfish, and avoids high fructose corn syrup because of history of gout. Exercises noted above.  Blood pressure ranges 98/68-136/87.    Review of Systems  Significant headaches, epistaxis, chest pain, palpitations, exertional dyspnea, claudication, paroxysmal nocturnal dyspnea, or edema absent. No memory loss or myalgias  Unexplained weight loss, abdominal pain, significant dyspepsia, dysphagia, melena, rectal bleeding, or persistently small caliber stools are denied. Dysuria, pyuria, hematuria, frequency,or polyuria are denied. He describes occasional hesitancy. Intermittently he has nocturia 2 twice a  night max.  He does have active issues with seasonal rhinitis.     Objective:   Physical Exam  Gen.: Adequately nourished in appearance. Alert, appropriate and cooperative throughout exam.  Appears younger than stated age  Head: Normocephalic without obvious abnormalities; beard & moustache  Eyes: No corneal or conjunctival inflammation noted. Pupils equal round reactive to light and accommodation. Extraocular motion intact.  Ears: External  ear exam reveals no significant lesions or deformities. Canals clear .TMs normal. Hearing is grossly normal bilaterally. Nose: External nasal exam reveals no deformity or inflammation. Nasal mucosa are pink and moist. No lesions or exudates noted.   Mouth: Oral mucosa and oropharynx reveal no lesions or exudates. Teeth in good repair. Neck: No deformities, masses, or tenderness noted. Range of motion & Thyroid normal Lungs: Normal respiratory effort; chest expands symmetrically. Lungs are clear to auscultation without rales, wheezes, or increased work of breathing. Heart: Slow & slightly irregular rate and rhythm. Normal S1 and S2. No gallop, click, or rub.No murmur. Abdomen: Bowel sounds normal; abdomen soft and nontender. No masses, organomegaly or hernias noted. Genitalia: Genitalia normal except for left varices. Prostate is normal without enlargement, asymmetry, nodularity, or induration                             Musculoskeletal/extremities: No deformity or scoliosis noted of  the thoracic or lumbar spine.  No clubbing, cyanosis, edema, or significant extremity  deformity noted.  Range of motion normal . Tone & strength normal. Hand joints normal  Fingernail  health good. Crepitus of knees  Able to lie down & sit up w/o help.  Negative SLR bilaterally Vascular: Carotid, radial artery, dorsalis pedis and  posterior tibial pulses are equal.Decreased DPP. No bruits present. Neurologic: Alert and oriented x3. Deep tendon reflexes symmetrical and  normal.  Gait  normal      Skin: Intact without suspicious lesions or rashes.Scattered solar keratotic changes over ears & hands. Extensive bruising over BUE. Lymph: No cervical, axillary, or inguinal lymphadenopathy present. Psych: Mood and affect are normal. Normally interactive                                                                                      Assessment & Plan:   #1 Medicare Wellness Exam; criteria met ; data entered #2 See Current Assessment & Plan in Problem List under specific Diagnosis. The labs will be reviewed and risks and options assessed.  Written recommendations will be provided by mail or directly through My Chart. Further evaluation or change in medical therapy will be directed by those results.

## 2014-05-09 NOTE — Assessment & Plan Note (Signed)
NMR Lipoprofile, LFT, TSH 

## 2014-05-09 NOTE — Assessment & Plan Note (Signed)
Uric acid

## 2014-05-09 NOTE — Patient Instructions (Addendum)
Preventive Health Care: Exercise at least 30-45 minutes a day,  3-4 days a week.  Eat a low-fat diet with lots of fruits and vegetables, up to 7-9 servings per day. This would eliminate the need for vitamin supplements. Avoid obesity; your goal is waist measurement < 40 inches.Consume less than 40 grams of sugar (preferably ZERO) per day from foods & drinks with High Fructose Corn Sugar as #1,2,3 or # 4 on label. Seatbelts can save your life. Wear them always. Smoke detectors on every level of your home, check batteries every year. Eye Doctor - have an eye exam @ least annually.     Alcohol If you drink, do it moderately,less than 14 drinks per week, preferably less than 7 @ most. Your colonoscopy is due 2021; [facility] will contact you. Depression is common in our stressful world.If you are having symptoms or losing interest in things you normally enjoy, please schedule an appointment to address this . Dermatology assessment of keratotic and solar skin changes recommended.  Plain Mucinex (NOT D) for thick secretions ;force NON dairy fluids .   Nasal cleansing in the shower as discussed with lather of mild shampoo.After 10 seconds wash off lather while  exhaling through nostrils. Make sure that all residual soap is removed to prevent irritation.  Flonase OR Nasacort AQ 1 spray in each nostril twice a day as needed. Use the "crossover" technique into opposite nostril spraying toward opposite ear @ 45 degree angle, not straight up into nostril.  Plain Allegra (NOT D )  160 daily , Loratidine 10 mg , OR Zyrtec 10 mg @ bedtime  as needed for itchy eyes & sneezing.

## 2014-05-11 LAB — NMR LIPOPROFILE WITH LIPIDS
Cholesterol, Total: 149 mg/dL (ref 100–199)
HDL Particle Number: 35.9 umol/L (ref >=30.5)
HDL SIZE: 9.7 nm (ref >=9.2)
HDL-C: 68 mg/dL (ref >39)
LDL (calc): 66 mg/dL (ref 0–99)
LDL PARTICLE NUMBER: 763 nmol/L (ref <1000)
LDL Size: 21 nm (ref >=20.8)
LP-IR Score: <25 (ref <=45)
Large HDL-P: 11.7 umol/L (ref >=4.8)
Large VLDL-P: 1.4 nmol/L (ref <=2.7)
Small LDL Particle Number: 220 nmol/L (ref <=527)
TRIGLYCERIDES: 75 mg/dL (ref 0–149)
VLDL SIZE: 39.9 nm (ref <=46.6)

## 2014-05-12 ENCOUNTER — Other Ambulatory Visit: Payer: Self-pay | Admitting: Internal Medicine

## 2014-05-12 ENCOUNTER — Other Ambulatory Visit: Payer: Self-pay | Admitting: General Practice

## 2014-05-12 MED ORDER — WARFARIN SODIUM 5 MG PO TABS
ORAL_TABLET | ORAL | Status: DC
Start: 2014-05-12 — End: 2014-10-03

## 2014-05-24 ENCOUNTER — Encounter: Payer: Self-pay | Admitting: Gastroenterology

## 2014-06-08 ENCOUNTER — Ambulatory Visit (INDEPENDENT_AMBULATORY_CARE_PROVIDER_SITE_OTHER): Payer: Medicare Other | Admitting: *Deleted

## 2014-06-08 DIAGNOSIS — Z5181 Encounter for therapeutic drug level monitoring: Secondary | ICD-10-CM

## 2014-06-08 DIAGNOSIS — I4891 Unspecified atrial fibrillation: Secondary | ICD-10-CM | POA: Diagnosis not present

## 2014-06-08 LAB — POCT INR: INR: 2.2

## 2014-06-08 NOTE — Progress Notes (Signed)
I have reviewed and agree with the plan. 

## 2014-06-08 NOTE — Progress Notes (Signed)
Pre visit review using our clinic review tool, if applicable. No additional management support is needed unless otherwise documented below in the visit note. 

## 2014-06-22 ENCOUNTER — Other Ambulatory Visit: Payer: Self-pay | Admitting: Internal Medicine

## 2014-07-13 ENCOUNTER — Ambulatory Visit (INDEPENDENT_AMBULATORY_CARE_PROVIDER_SITE_OTHER): Payer: Medicare Other | Admitting: General Practice

## 2014-07-13 DIAGNOSIS — Z5181 Encounter for therapeutic drug level monitoring: Secondary | ICD-10-CM | POA: Diagnosis not present

## 2014-07-13 DIAGNOSIS — I4891 Unspecified atrial fibrillation: Secondary | ICD-10-CM

## 2014-07-13 LAB — POCT INR: INR: 2.2

## 2014-07-13 NOTE — Progress Notes (Signed)
Pre visit review using our clinic review tool, if applicable. No additional management support is needed unless otherwise documented below in the visit note. 

## 2014-07-13 NOTE — Progress Notes (Signed)
I have reviewed and agree with the plan. 

## 2014-07-27 ENCOUNTER — Other Ambulatory Visit: Payer: Self-pay | Admitting: Cardiovascular Disease

## 2014-08-24 ENCOUNTER — Ambulatory Visit (INDEPENDENT_AMBULATORY_CARE_PROVIDER_SITE_OTHER): Payer: Medicare Other | Admitting: General Practice

## 2014-08-24 DIAGNOSIS — I4891 Unspecified atrial fibrillation: Secondary | ICD-10-CM

## 2014-08-24 DIAGNOSIS — Z5181 Encounter for therapeutic drug level monitoring: Secondary | ICD-10-CM | POA: Diagnosis not present

## 2014-08-24 LAB — POCT INR: INR: 2.2

## 2014-08-24 NOTE — Progress Notes (Signed)
Pre visit review using our clinic review tool, if applicable. No additional management support is needed unless otherwise documented below in the visit note. 

## 2014-08-24 NOTE — Progress Notes (Signed)
I have reviewed and agree with the plan. 

## 2014-09-28 ENCOUNTER — Encounter: Payer: Self-pay | Admitting: *Deleted

## 2014-10-02 NOTE — Progress Notes (Signed)
Patient ID: Reginald Green, male   DOB: 18-Mar-1945, 69 y.o.   MRN: [id number] Reginald Green is a 69 y.o.  male who presents for follow up . He has a history of chronic atrial fibrillation on  anticoagulation, hypertension, COPD, gout, hyperlipidemia. He was seen in the emergency room 01/10/11 with chest discomfort and episodic dizziness. He also noted a spinning sensation at times. Cardiac markers were negative in the emergency room. Potassium was 3.9, creatinine 0.9, hemoglobin 14.6. Chest x-ray demonstrated chronically elevated left hemidiaphragm, borderline cardiac enlargement, pulmonary vascularity questionable for developing vascular congestion, no active consolidation. Patient was seen by Dr. Johney Frame. He felt the patient could be evaluated as an outpatient with an echocardiogram, myoview and an event monitor. Echocardiogram 01/15/11 for mild LVH, EF 65%, moderate to severe LAE, moderate RAE. Myoview 01/18/11: No scar or ischemia. Study not gated due to a true fibrillation. I have reviewed the available strips from his monitor and these demonstrate atrial fibrillation without significant pauses or bradycardia.   Saw Dr Modesto Charon and posterior circulation MRI/MRA was fine. Symptoms have not recurred  Event monitor 2/13 with persistant afib. No pauses and reasonable rate control PVC no sustained VT  No complaints Still does not want novel agent   5/15  Cr and K normal labs reviewed   Willing to change to NOAC  INR 2.7 today   ROS: Denies fever, malais, weight loss, blurry vision, decreased visual acuity, cough, sputum, SOB, hemoptysis, pleuritic pain, palpitaitons, heartburn, abdominal pain, melena, lower extremity edema, claudication, or rash.  All other systems reviewed and negative  General: Affect appropriate Healthy:  appears stated age HEENT: normal Neck supple with no adenopathy JVP normal no bruits no thyromegaly Lungs clear with no wheezing and good diaphragmatic motion Heart:  S1/S2 no  murmur, no rub, gallop or click PMI normal Abdomen: benighn, BS positve, no tenderness, no AAA no bruit.  No HSM or HJR Distal pulses intact with no bruits No edema Neuro non-focal Skin warm and dry  Bruising on back of hands  No muscular weakness   Current Outpatient Prescriptions  Medication Sig Dispense Refill  . ADVAIR DISKUS 250-50 MCG/DOSE AEPB INHALE 1 PUFF BY MOUTH EVERY 12 HOURS. 60 each 3  . allopurinol (ZYLOPRIM) 300 MG tablet Take 0.5 tablets (150 mg total) by mouth daily. 45 tablet 3  . aspirin 81 MG tablet Take 81 mg by mouth daily.      Marland Kitchen atorvastatin (LIPITOR) 20 MG tablet TAKE 1 TABLET BY MOUTH ON MONDAY, WEDNESDAY, FRIDAY, AND SUNDAY 51 tablet 3  . fluticasone (FLONASE) 50 MCG/ACT nasal spray Place 1 spray into the nose 2 (two) times daily. 1 g 11  . lisinopril (PRINIVIL,ZESTRIL) 20 MG tablet Take 0.5 tablets (10 mg total) by mouth daily. 45 tablet 3  . metoprolol (LOPRESSOR) 50 MG tablet 1/2 by mouth twice daily 90 tablet 3  . Multiple Vitamin (MULTIVITAMIN) tablet Take 1 tablet by mouth daily.      Marland Kitchen warfarin (COUMADIN) 5 MG tablet Take as directed by anticoagulation clinic 105 tablet 3   No current facility-administered medications for this visit.    Allergies  Review of patient's allergies indicates no known allergies.  Electrocardiogram:  afib otherwise normal ECG 2014  2015  Afib rate 55 othewise normal   10/03/14  afib rate 44 no change   Assessment and Plan Chronic afib: Check labs today change to xarelto 20 mg  Decrease lopressor to 1/2 tab daily Anticoagulation: INR 2.7 start xarelto  labs no longer needs to go to coumadin clinic HTN: Well controlled.  Continue current medications and low sodium Dash type diet.   Chol:   Lab Results  Component Value Date   LDLCALC 66 05/09/2014    COPD:  No active wheezing yearly CXR Advair  Labs today Xarelto new lopressor decreased F/U 3 months    Reginald Green

## 2014-10-03 ENCOUNTER — Telehealth: Payer: Self-pay

## 2014-10-03 ENCOUNTER — Ambulatory Visit (INDEPENDENT_AMBULATORY_CARE_PROVIDER_SITE_OTHER): Payer: Medicare Other | Admitting: *Deleted

## 2014-10-03 ENCOUNTER — Encounter: Payer: Self-pay | Admitting: Cardiovascular Disease

## 2014-10-03 ENCOUNTER — Ambulatory Visit (INDEPENDENT_AMBULATORY_CARE_PROVIDER_SITE_OTHER): Payer: Medicare Other | Admitting: Cardiovascular Disease

## 2014-10-03 VITALS — BP 114/68 | HR 44 | Ht 75.0 in | Wt 215.0 lb

## 2014-10-03 DIAGNOSIS — Z5181 Encounter for therapeutic drug level monitoring: Secondary | ICD-10-CM

## 2014-10-03 DIAGNOSIS — I4891 Unspecified atrial fibrillation: Secondary | ICD-10-CM

## 2014-10-03 DIAGNOSIS — Z79899 Other long term (current) drug therapy: Secondary | ICD-10-CM | POA: Diagnosis not present

## 2014-10-03 DIAGNOSIS — I10 Essential (primary) hypertension: Secondary | ICD-10-CM

## 2014-10-03 LAB — CBC WITH DIFFERENTIAL/PLATELET
Basophils Absolute: 0.1 10*3/uL (ref 0.0–0.1)
Basophils Relative: 0.7 % (ref 0.0–3.0)
Eosinophils Absolute: 0.1 10*3/uL (ref 0.0–0.7)
Eosinophils Relative: 1.8 % (ref 0.0–5.0)
HEMATOCRIT: 43.1 % (ref 39.0–52.0)
Hemoglobin: 14.4 g/dL (ref 13.0–17.0)
Lymphocytes Relative: 19.4 % (ref 12.0–46.0)
Lymphs Abs: 1.6 10*3/uL (ref 0.7–4.0)
MCHC: 33.5 g/dL (ref 30.0–36.0)
MCV: 98.7 fl (ref 78.0–100.0)
MONOS PCT: 10.7 % (ref 3.0–12.0)
Monocytes Absolute: 0.9 10*3/uL (ref 0.1–1.0)
Neutro Abs: 5.4 10*3/uL (ref 1.4–7.7)
Neutrophils Relative %: 67.4 % (ref 43.0–77.0)
Platelets: 218 10*3/uL (ref 150.0–400.0)
RBC: 4.37 Mil/uL (ref 4.22–5.81)
RDW: 13 % (ref 11.5–15.5)
WBC: 8.1 10*3/uL (ref 4.0–10.5)

## 2014-10-03 LAB — BASIC METABOLIC PANEL
BUN: 18 mg/dL (ref 6–23)
CALCIUM: 9.2 mg/dL (ref 8.4–10.5)
CO2: 27 meq/L (ref 19–32)
CREATININE: 0.92 mg/dL (ref 0.40–1.50)
Chloride: 105 mEq/L (ref 96–112)
GFR: 87.07 mL/min (ref >60.00)
GLUCOSE: 86 mg/dL (ref 70–99)
Potassium: 4.4 mEq/L (ref 3.5–5.1)
SODIUM: 138 meq/L (ref 135–145)

## 2014-10-03 LAB — POCT INR: INR: 2.7

## 2014-10-03 MED ORDER — RIVAROXABAN 20 MG PO TABS: 20.0000 mg | ORAL_TABLET | Freq: Every day | ORAL | Status: DC

## 2014-10-03 NOTE — Telephone Encounter (Signed)
Prior auth for Xarelto  sent to [facility] via Cover my Meds.

## 2014-10-03 NOTE — Patient Instructions (Addendum)
Medication Instructions:  Your physician has recommended you make the following change in your medication:  STOP  WARFARIN START  XARELTO  20 MG  EVERY DAY  DECREASE METOPROLOL  TO  1/2  TAB  DAILY  Labwork: TODAY   BMET  CBC  Testing/Procedures: NONE Follow-Up: Your physician recommends that you schedule a follow-up appointment in: 3 MONTHS WITH  DR Eden Emms

## 2014-10-04 ENCOUNTER — Telehealth: Payer: Self-pay

## 2014-10-04 NOTE — Telephone Encounter (Signed)
Xarelto  approved by [employer] for 1 year. [id number]. Pharmacy notified.

## 2014-10-07 ENCOUNTER — Telehealth: Payer: Self-pay | Admitting: Cardiovascular Disease

## 2014-10-07 ENCOUNTER — Encounter: Payer: Self-pay | Admitting: Cardiovascular Disease

## 2014-10-07 MED ORDER — METOPROLOL TARTRATE 25 MG PO TABS: 12.5000 mg | ORAL_TABLET | Freq: Two times a day (BID) | ORAL | Status: DC

## 2014-10-07 NOTE — Telephone Encounter (Signed)
New Message  Pt calling to speak w/ Rn concerning dec of metoprolol, and the addition xarelto. Pt stated he is having trouble sleeping, AF more noticeable lying down. Please call back and discuss.

## 2014-10-07 NOTE — Telephone Encounter (Signed)
Spoke with patient r/e his c/o of having a mild headache, and problems sleeping due to heart palpitations for the past 2 nights. Patient said his heart rate has been normal and staying around 60. Patient informed that it could be side effects from dose decrease of metoprolol. No chest pain, sob or dizziness. Confirmed dosage of metoprolol 25 mg daily in the morning and it was started on 27th of September. Please advise if patient can start 12.5 metoprolol twice daily to help with symptoms at night.

## 2014-10-07 NOTE — Telephone Encounter (Signed)
Patient informed to break the 25 mg metoprolol in half twice daily.

## 2014-10-07 NOTE — Telephone Encounter (Signed)
Ok to do 12.5 bid of lopressor

## 2014-10-17 ENCOUNTER — Encounter: Payer: Self-pay | Admitting: Internal Medicine

## 2014-10-26 ENCOUNTER — Other Ambulatory Visit: Payer: Self-pay | Admitting: Emergency Medicine

## 2014-10-26 ENCOUNTER — Other Ambulatory Visit: Payer: Self-pay | Admitting: Internal Medicine

## 2014-10-26 MED ORDER — FLUTICASONE-SALMETEROL 250-50 MCG/DOSE IN AEPB: INHALATION_SPRAY | RESPIRATORY_TRACT | Status: DC

## 2014-12-13 ENCOUNTER — Emergency Department (HOSPITAL_COMMUNITY): Payer: Medicare Other

## 2014-12-13 ENCOUNTER — Observation Stay (HOSPITAL_COMMUNITY)
Admission: EM | Admit: 2014-12-13 | Discharge: 2014-12-15 | Disposition: A | Discharge status: Home / Self Care | Payer: Medicare Other | Attending: Internal Medicine | Admitting: Internal Medicine

## 2014-12-13 ENCOUNTER — Encounter (HOSPITAL_COMMUNITY): Payer: Self-pay | Admitting: Emergency Medicine

## 2014-12-13 ENCOUNTER — Telehealth: Payer: Self-pay | Admitting: Internal Medicine

## 2014-12-13 DIAGNOSIS — I482 Chronic atrial fibrillation: Secondary | ICD-10-CM | POA: Insufficient documentation

## 2014-12-13 DIAGNOSIS — S300XXA Contusion of lower back and pelvis, initial encounter: Principal | ICD-10-CM

## 2014-12-13 DIAGNOSIS — J45909 Unspecified asthma, uncomplicated: Secondary | ICD-10-CM | POA: Diagnosis not present

## 2014-12-13 DIAGNOSIS — M109 Gout, unspecified: Secondary | ICD-10-CM | POA: Insufficient documentation

## 2014-12-13 DIAGNOSIS — E785 Hyperlipidemia, unspecified: Secondary | ICD-10-CM | POA: Diagnosis not present

## 2014-12-13 DIAGNOSIS — J449 Chronic obstructive pulmonary disease, unspecified: Secondary | ICD-10-CM | POA: Diagnosis not present

## 2014-12-13 DIAGNOSIS — I4891 Unspecified atrial fibrillation: Secondary | ICD-10-CM | POA: Diagnosis present

## 2014-12-13 DIAGNOSIS — Z87891 Personal history of nicotine dependence: Secondary | ICD-10-CM | POA: Diagnosis not present

## 2014-12-13 DIAGNOSIS — W228XXA Striking against or struck by other objects, initial encounter: Secondary | ICD-10-CM | POA: Diagnosis not present

## 2014-12-13 DIAGNOSIS — Z7951 Long term (current) use of inhaled steroids: Secondary | ICD-10-CM | POA: Diagnosis not present

## 2014-12-13 DIAGNOSIS — Z79899 Other long term (current) drug therapy: Secondary | ICD-10-CM | POA: Diagnosis not present

## 2014-12-13 DIAGNOSIS — I10 Essential (primary) hypertension: Secondary | ICD-10-CM | POA: Diagnosis present

## 2014-12-13 DIAGNOSIS — Z7901 Long term (current) use of anticoagulants: Secondary | ICD-10-CM | POA: Diagnosis not present

## 2014-12-13 DIAGNOSIS — Z7982 Long term (current) use of aspirin: Secondary | ICD-10-CM | POA: Diagnosis not present

## 2014-12-13 DIAGNOSIS — R52 Pain, unspecified: Secondary | ICD-10-CM | POA: Diagnosis present

## 2014-12-13 LAB — CBC WITH DIFFERENTIAL/PLATELET
Basophils Absolute: 0.1 10*3/uL (ref 0.0–0.1)
Basophils Relative: 0 %
Eosinophils Absolute: 0 10*3/uL (ref 0.0–0.7)
Eosinophils Relative: 0 %
HCT: 39.4 % (ref 39.0–52.0)
Hemoglobin: 14.2 g/dL (ref 13.0–17.0)
Lymphocytes Relative: 8 %
Lymphs Abs: 1.1 10*3/uL (ref 0.7–4.0)
MCH: 34.3 pg — ABNORMAL HIGH (ref 26.0–34.0)
MCHC: 36 g/dL (ref 30.0–36.0)
MCV: 95.2 fL (ref 78.0–100.0)
Monocytes Absolute: 0.7 10*3/uL (ref 0.1–1.0)
Monocytes Relative: 5 %
Neutro Abs: 11.6 10*3/uL — ABNORMAL HIGH (ref 1.7–7.7)
Neutrophils Relative %: 87 %
Platelets: 224 10*3/uL (ref 150–400)
RBC: 4.14 MIL/uL — ABNORMAL LOW (ref 4.22–5.81)
RDW: 12.5 % (ref 11.5–15.5)
WBC: 13.5 10*3/uL — ABNORMAL HIGH (ref 4.0–10.5)

## 2014-12-13 LAB — BASIC METABOLIC PANEL
Anion gap: 9 (ref 5–15)
BUN: 18 mg/dL (ref 6–20)
CO2: 22 mmol/L (ref 22–32)
Calcium: 9.4 mg/dL (ref 8.9–10.3)
Chloride: 103 mmol/L (ref 101–111)
Creatinine, Ser: 0.98 mg/dL (ref 0.61–1.24)
GFR calc Af Amer: >60 mL/min (ref >60)
GFR calc non Af Amer: >60 mL/min (ref >60)
Glucose, Bld: 107 mg/dL — ABNORMAL HIGH (ref 65–99)
Potassium: 4.3 mmol/L (ref 3.5–5.1)
Sodium: 134 mmol/L — ABNORMAL LOW (ref 135–145)

## 2014-12-13 LAB — PROTIME-INR
INR: 1.23 (ref 0.00–1.49)
Prothrombin Time: 15.7 seconds — ABNORMAL HIGH (ref 11.6–15.2)

## 2014-12-13 LAB — CK: Total CK: 60 U/L (ref 49–397)

## 2014-12-13 MED ORDER — DIAZEPAM 5 MG/ML IJ SOLN
5.0000 mg | Freq: Once | INTRAMUSCULAR | Status: AC
  Administered 2014-12-13: 5 mg via INTRAVENOUS
  Filled 2014-12-13: qty 2

## 2014-12-13 MED ORDER — BUPIVACAINE HCL 0.5 % IJ SOLN
50.0000 mL | Freq: Once | INTRAMUSCULAR | Status: AC
  Administered 2014-12-13: 50 mL
  Filled 2014-12-13: qty 50

## 2014-12-13 MED ORDER — MORPHINE SULFATE (PF) 4 MG/ML IV SOLN
4.0000 mg | Freq: Once | INTRAVENOUS | Status: AC
  Administered 2014-12-13: 4 mg via INTRAVENOUS
  Filled 2014-12-13: qty 1

## 2014-12-13 MED ORDER — IOHEXOL 300 MG/ML  SOLN
100.0000 mL | Freq: Once | INTRAMUSCULAR | Status: AC | PRN
  Administered 2014-12-13: 100 mL via INTRAVENOUS

## 2014-12-13 MED ORDER — CYCLOBENZAPRINE HCL 10 MG PO TABS
5.0000 mg | ORAL_TABLET | Freq: Once | ORAL | Status: AC
  Administered 2014-12-13: 5 mg via ORAL
  Filled 2014-12-13: qty 1

## 2014-12-13 NOTE — ED Notes (Signed)
Labs was drawn earlier, Reginald Green in lab stated to send labels down for new blood orders.

## 2014-12-13 NOTE — ED Provider Notes (Signed)
CSN: [id number]     Arrival date & time 12/13/14  1657 History   First MD Initiated Contact with Patient 12/13/14 1709     Chief Complaint  Patient presents with  . Buttock injury   . Back Injury  . Bulging Disc?    HPI   69 year old male presents today with pain to the left buttock. Patient reports that around 5:00 he was getting out of her recliner when he felt a sharp "pull" to his left buttock. He reports pain progressed with associated enlarging "bulge". He attempted stretching of the buttock as he thought this was a muscular strain and likely cramp but this did not improve symptoms. Patient reports gradually worsening pain decreased mobility and swelling to the affected area. Patient denies any history of the same. Patient is able to ambulate, but does have some pain and difficulty. Patient denies any low back pain, bony tenderness or abnormality, or loss of distal sensation strength or motor function.   Past Medical History  Diagnosis Date  . Hypertension   . COPD (chronic obstructive pulmonary disease) (HCC)   . Gout   . Chronic atrial fibrillation (HCC)     Multiple failed cardioversions (even on flecanide). Normal LV function by echo in 2009 with LA 45mm, reported nonischemic myoview at that time  . Hyperlipidemia   . Gilbert's disease    Past Surgical History  Procedure Laterality Date  . Hernia repair      Undescended testicle brought down at this time in 1979  . Appendectomy      At time of hernia repair in 1979  . Tonsillectomy    . Colonoscopy  2011    neg, [facility], Due 2021   . Flexible sigmoidoscopy  05/12/1997   Family History  Problem Relation Age of Onset  . Stroke Mother [id number]  . Hypertension Mother   . Heart attack Maternal Uncle 55  . Diabetes Maternal Grandmother   . Asthma Neg Hx   . COPD Neg Hx   . Heart attack Father [id number]   Social History  Substance Use Topics  . Smoking status: Former Smoker    Types: Cigarettes    Quit date: 02/01/1987  .  Smokeless tobacco: Never Used     Comment: smoked 1967-1989 , up to 2 ppd  . Alcohol Use: 8.4 oz/week    14 Cans of beer per week    Review of Systems  All other systems reviewed and are negative.   Allergies  Review of patient's allergies indicates no known allergies.  Home Medications   Prior to Admission medications   Medication Sig Start Date End Date Taking? Authorizing Provider  allopurinol (ZYLOPRIM) 300 MG tablet Take 0.5 tablets (150 mg total) by mouth daily. 05/09/14  Yes Pecola LawlessWilliam F Hopper, MD  aspirin 81 MG tablet Take 81 mg by mouth daily.     Yes Historical Provider, MD  atorvastatin (LIPITOR) 20 MG tablet TAKE 1 TABLET BY MOUTH ON MONDAY, WEDNESDAY, FRIDAY, AND SUNDAY 05/09/14  Yes Pecola LawlessWilliam F Hopper, MD  fluticasone [facility](FLONASE) 50 MCG/ACT nasal spray Place 1 spray into the nose 2 (two) times daily. 05/05/12  Yes Pecola LawlessWilliam F Hopper, MD  Fluticasone-Salmeterol (ADVAIR DISKUS) 250-50 MCG/DOSE AEPB INHALE 1 PUFF BY MOUTH EVERY 12 HOURS. 10/26/14  Yes Pecola LawlessWilliam F Hopper, MD  lisinopril (PRINIVIL,ZESTRIL) 20 MG tablet Take 0.5 tablets (10 mg total) by mouth daily. 05/09/14  Yes Pecola LawlessWilliam F Hopper, MD  metoprolol tartrate (LOPRESSOR) 25 MG tablet Take 0.5 tablets (12.5  mg total) by mouth 2 (two) times daily. 10/07/14  Yes Wendall Stade, MD  Multiple Vitamin (MULTIVITAMIN) tablet Take 1 tablet by mouth daily.     Yes Historical Provider, MD  rivaroxaban (XARELTO) 20 MG TABS tablet Take 1 tablet (20 mg total) by mouth daily with supper. 10/03/14  Yes Wendall Stade, MD   BP 128/77 mmHg  Pulse 85  Temp(Src) 98 F (36.7 C) (Oral)  Resp 16  Ht  (1.905 m)  Wt 93.7 kg  BMI 25.82 kg/m2  SpO2 100%   Physical Exam  Constitutional: He is oriented to person, place, and time. He appears well-developed and well-nourished.  HENT:  Head: Normocephalic and atraumatic.  Eyes: Conjunctivae are normal. Pupils are equal, round, and reactive to light. Right eye exhibits no discharge. Left eye exhibits  no discharge. No scleral icterus.  Neck: Normal range of motion. No JVD present. No tracheal deviation present.  Pulmonary/Chest: Effort normal. No stridor.  Abdominal: Soft. He exhibits no mass. There is no tenderness. There is no rebound and no guarding.  Musculoskeletal:  Large area of deformity to the left buttock, muscle tenderness tender to palpation. Patient has no pain with lateral compression of the hips, no tenderness to palpation of the pelvis and sacrum or lumbar spine. Distal sensation strength and motor function intact, 5 out of 5 strength.  Neurological: He is alert and oriented to person, place, and time. Coordination normal.  Skin: Skin is warm and dry. No rash noted. No erythema. No pallor.  Psychiatric: He has a normal mood and affect. His behavior is normal. Judgment and thought content normal.  Nursing note and vitals reviewed.     ED Course  Procedures (including critical care time)   Labs Review Labs Reviewed  BASIC METABOLIC PANEL - Abnormal; Notable for the following:    Sodium 134 (*)    Glucose, Bld 107 (*)    All other components within normal limits  PROTIME-INR - Abnormal; Notable for the following:    Prothrombin Time 15.7 (*)    All other components within normal limits  CBC WITH DIFFERENTIAL/PLATELET - Abnormal; Notable for the following:    WBC 13.5 (*)    RBC 4.14 (*)    MCH 34.3 (*)    Neutro Abs 11.6 (*)    All other components within normal limits  CK  CBC  PROTIME-INR  APTT  COMPREHENSIVE METABOLIC PANEL  HEMOGLOBIN A1C  TSH    Imaging Review Ct Pelvis W Contrast  12/13/2014  CLINICAL DATA:  69 year old male with concern for left gluteal hematoma. Patient is on Coumadin EXAM: CT PELVIS WITH CONTRAST TECHNIQUE: Multidetector CT imaging of the pelvis was performed using the standard protocol following the bolus administration of intravenous contrast. CONTRAST:  OMNIPAQUE IOHEXOL 300 MG/ML  SOLN COMPARISON:  CT dated 08/06/2010  FINDINGS: No free air or fluid identified within the pelvis. The urinary bladder, prostate gland, and seminal vesicles are grossly unremarkable. There is sigmoid diverticulosis without active inflammation. The visualized appendix appears unremarkable. There is aortoiliac atherosclerotic disease. No adenopathy noted within the pelvis. Old healed bilateral pubic bone fractures. No acute fracture. There is a left gluteal intramuscular hematoma measuring approximately 7.1 x 10.7 cm in greatest axial dimensions. Follow-up recommended. IMPRESSION: Left gluteal intramuscular hematoma.  Follow-up recommended. Electronically Signed   By: Elgie Collard M.D.   On: 12/13/2014 23:18   Dg Hip Unilat With Pelvis 2-3 Views Left  12/13/2014  CLINICAL DATA:  Fall.  Left buttock pain.  Left hip pain. EXAM: DG HIP (WITH OR WITHOUT PELVIS) 2-3V LEFT COMPARISON:  None available. FINDINGS: Left hip is located. No acute bone or soft tissue abnormalities are present. Atherosclerotic changes are present. The pelvis is intact. Asymmetric right-sided degenerative changes are noted in the lower lumbar spine IMPRESSION: 1. No acute abnormality of the left hip. 2. Degenerative changes in the lower lumbar spine. Electronically Signed   By: Marin Roberts M.D.   On: 12/13/2014 17:43   I have personally reviewed and evaluated these images and lab results as part of my medical decision-making.   EKG Interpretation None      MDM   Final diagnoses:  Traumatic hematoma of buttock, initial encounter    Labs:  Imaging: DG hip in her lateral left with pelvis  Consults:  Therapeutics: Morphine, Flexeril, Valium  Discharge Meds:   Assessment/Plan: 69 year old male presents with swelling to the left gluteus. Patient has active muscle contractions of both the gluteus and left lower extremity. After patient was unable to be managed with oral medications, trigger point injection with diagnostic aspiration showed hematoma. CT  scan showed intramuscular hematoma. Patient has a normal hemoglobin, does not appear to be actively bleeding as no significant expansion in the bulge at progressed during his lengthy ED stay. Patient was able to ambulate with difficulty. Surgery consult at who advised DC of Xarelto, with hospital admission and surgery consultation in the morning. Hospitalist consult and who agreed to hospital admission and further evaluation and management. Patient remained stable here in the ED, he had no other sources of bleeding, and no other complaints.          Eyvonne Mechanic, PA-C 12/14/14 0151  Lyndal Pulley, MD 12/14/14 1525  Lyndal Pulley, MD 12/14/14 [phone]

## 2014-12-13 NOTE — ED Notes (Signed)
Pt reports getting up from recliner this afternoon around 1430 and feeling as though he "strained a muscle." Pt has noticeable buttock asymmetry on the left. Noted hardened muscular bulge noted on the left buttock. Contacted triage RN at PCP and was told he may have a bulging disc and to "come straight to the ED for x-rays." Pt unable to sit. No incontinence noted. No other c/c.

## 2014-12-13 NOTE — Telephone Encounter (Signed)
[facility] - Client TELEPHONE ADVICE RECORD   [facility]     [facility] Initial Comment Caller states he pulled a muscle in his buttocks and has a knot and is very tight with pain.  DOB: 01/14/1945      Nurse Assessment  Nurse: Harlon FlorWhitaker, RN, Darl PikesSusan Date/Time (Eastern Time): 12/13/2014 3:57:46 PM  Confirm and document reason for call. If symptomatic, describe symptoms. ---Caller states he pulled a muscle in his buttocks and has a knot and is very tight with pain. Left buttock is hurting pain level is 9/10 No pain in lower back . Lateral hip area on left has a knot in it . He was sitting in recliner with 55month old grandchild and went to stand up and felt the pain. He is able to stand straight but cannot sit 'he is lying on his back now.  Has the patient traveled out of the country within the last 30 days? ---No  Does the patient have any new or worsening symptoms? ---Yes  Will a triage be completed? ---Yes  Related visit to physician within the last 2 weeks? ---No  Does the PT have any chronic conditions? (i.e. diabetes, asthma, etc.) ---Yes  List chronic conditions. ---HTN A fib on xarelto and 81 mg ASA statin  Is this a behavioral health or substance abuse call? ---No    Guidelines     Guideline Title Affirmed Question Affirmed Notes   Hip Pain [1] SEVERE pain (e.g., excruciating, unable to do any normal activities) AND [2] fever    Final Disposition User   Go to ED Now Harlon FlorWhitaker, RN, Darl PikesSusan     [facility]   [facility]   Disagree/Comply: Comply

## 2014-12-14 ENCOUNTER — Encounter (HOSPITAL_COMMUNITY): Payer: Self-pay | Admitting: *Deleted

## 2014-12-14 DIAGNOSIS — I4891 Unspecified atrial fibrillation: Secondary | ICD-10-CM | POA: Diagnosis not present

## 2014-12-14 DIAGNOSIS — S300XXA Contusion of lower back and pelvis, initial encounter: Secondary | ICD-10-CM | POA: Diagnosis not present

## 2014-12-14 DIAGNOSIS — J4520 Mild intermittent asthma, uncomplicated: Secondary | ICD-10-CM | POA: Diagnosis not present

## 2014-12-14 DIAGNOSIS — E785 Hyperlipidemia, unspecified: Secondary | ICD-10-CM | POA: Diagnosis not present

## 2014-12-14 LAB — PROTIME-INR
INR: 1.18 (ref 0.00–1.49)
Prothrombin Time: 15.2 seconds (ref 11.6–15.2)

## 2014-12-14 LAB — APTT: aPTT: 32 seconds (ref 24–37)

## 2014-12-14 LAB — COMPREHENSIVE METABOLIC PANEL
ALBUMIN: 3.3 g/dL — AB (ref 3.5–5.0)
ALK PHOS: 45 U/L (ref 38–126)
ALT: 22 U/L (ref 17–63)
AST: 25 U/L (ref 15–41)
Anion gap: 8 (ref 5–15)
BILIRUBIN TOTAL: 1 mg/dL (ref 0.3–1.2)
BUN: 16 mg/dL (ref 6–20)
CALCIUM: 9.3 mg/dL (ref 8.9–10.3)
CO2: 27 mmol/L (ref 22–32)
CREATININE: 1.12 mg/dL (ref 0.61–1.24)
Chloride: 103 mmol/L (ref 101–111)
GFR calc Af Amer: >60 mL/min (ref >60)
GFR calc non Af Amer: >60 mL/min (ref >60)
GLUCOSE: 110 mg/dL — AB (ref 65–99)
Potassium: 3.6 mmol/L (ref 3.5–5.1)
SODIUM: 138 mmol/L (ref 135–145)
TOTAL PROTEIN: 6.2 g/dL — AB (ref 6.5–8.1)

## 2014-12-14 LAB — CBC
HCT: 37.8 % — ABNORMAL LOW (ref 39.0–52.0)
HEMOGLOBIN: 13.3 g/dL (ref 13.0–17.0)
MCH: 34 pg (ref 26.0–34.0)
MCHC: 35.2 g/dL (ref 30.0–36.0)
MCV: 96.7 fL (ref 78.0–100.0)
Platelets: 225 10*3/uL (ref 150–400)
RBC: 3.91 MIL/uL — AB (ref 4.22–5.81)
RDW: 12.7 % (ref 11.5–15.5)
WBC: 9.6 10*3/uL (ref 4.0–10.5)

## 2014-12-14 LAB — TSH: TSH: 2.731 u[IU]/mL (ref 0.350–4.500)

## 2014-12-14 LAB — GLUCOSE, CAPILLARY: Glucose-Capillary: 126 mg/dL — ABNORMAL HIGH (ref 65–99)

## 2014-12-14 MED ORDER — ALLOPURINOL 150 MG HALF TABLET
150.0000 mg | ORAL_TABLET | Freq: Every day | ORAL | Status: DC
  Administered 2014-12-14 – 2014-12-15 (×2): 150 mg via ORAL
  Filled 2014-12-14 (×2): qty 1

## 2014-12-14 MED ORDER — FOLIC ACID 1 MG PO TABS
1.0000 mg | ORAL_TABLET | Freq: Every day | ORAL | Status: DC
  Administered 2014-12-14 – 2014-12-15 (×2): 1 mg via ORAL
  Filled 2014-12-14 (×2): qty 1

## 2014-12-14 MED ORDER — FLUTICASONE PROPIONATE 50 MCG/ACT NA SUSP
1.0000 | Freq: Two times a day (BID) | NASAL | Status: DC
  Administered 2014-12-14 – 2014-12-15 (×4): 1 via NASAL
  Filled 2014-12-14 (×2): qty 16

## 2014-12-14 MED ORDER — METOPROLOL TARTRATE 25 MG PO TABS
12.5000 mg | ORAL_TABLET | Freq: Two times a day (BID) | ORAL | Status: DC
  Administered 2014-12-14 – 2014-12-15 (×4): 12.5 mg via ORAL
  Filled 2014-12-14 (×4): qty 1

## 2014-12-14 MED ORDER — ONDANSETRON HCL 4 MG/2ML IJ SOLN: 4.0000 mg | Freq: Four times a day (QID) | INTRAMUSCULAR | Status: DC | PRN

## 2014-12-14 MED ORDER — VITAMIN B-1 100 MG PO TABS
100.0000 mg | ORAL_TABLET | Freq: Every day | ORAL | Status: DC
  Administered 2014-12-14 – 2014-12-15 (×2): 100 mg via ORAL
  Filled 2014-12-14 (×2): qty 1

## 2014-12-14 MED ORDER — ATORVASTATIN CALCIUM 20 MG PO TABS
20.0000 mg | ORAL_TABLET | Freq: Every day | ORAL | Status: DC
  Administered 2014-12-14: 20 mg via ORAL
  Filled 2014-12-14 (×2): qty 1
  Filled 2014-12-14: qty 2

## 2014-12-14 MED ORDER — ADULT MULTIVITAMIN W/MINERALS CH: 1.0000 | ORAL_TABLET | Freq: Every day | ORAL | Status: DC

## 2014-12-14 MED ORDER — ACETAMINOPHEN 325 MG PO TABS
650.0000 mg | ORAL_TABLET | Freq: Four times a day (QID) | ORAL | Status: DC | PRN
  Administered 2014-12-14: 650 mg via ORAL
  Filled 2014-12-14: qty 2

## 2014-12-14 MED ORDER — DOCUSATE SODIUM 100 MG PO CAPS
100.0000 mg | ORAL_CAPSULE | Freq: Two times a day (BID) | ORAL | Status: DC
  Administered 2014-12-14 – 2014-12-15 (×4): 100 mg via ORAL
  Filled 2014-12-14 (×4): qty 1

## 2014-12-14 MED ORDER — MOMETASONE FURO-FORMOTEROL FUM 100-5 MCG/ACT IN AERO
2.0000 | INHALATION_SPRAY | Freq: Two times a day (BID) | RESPIRATORY_TRACT | Status: DC
  Administered 2014-12-14 – 2014-12-15 (×2): 2 via RESPIRATORY_TRACT
  Filled 2014-12-14 (×3): qty 8.8

## 2014-12-14 MED ORDER — SODIUM CHLORIDE 0.9 % IJ SOLN
3.0000 mL | Freq: Two times a day (BID) | INTRAMUSCULAR | Status: DC
  Administered 2014-12-14 (×2): 3 mL via INTRAVENOUS

## 2014-12-14 MED ORDER — ADULT MULTIVITAMIN W/MINERALS CH
1.0000 | ORAL_TABLET | Freq: Every day | ORAL | Status: DC
  Administered 2014-12-14 – 2014-12-15 (×2): 1 via ORAL
  Filled 2014-12-14 (×2): qty 1

## 2014-12-14 MED ORDER — LISINOPRIL 10 MG PO TABS
10.0000 mg | ORAL_TABLET | Freq: Every day | ORAL | Status: DC
  Administered 2014-12-14 – 2014-12-15 (×2): 10 mg via ORAL
  Filled 2014-12-14 (×2): qty 1

## 2014-12-14 MED ORDER — ACETAMINOPHEN 650 MG RE SUPP: 650.0000 mg | Freq: Four times a day (QID) | RECTAL | Status: DC | PRN

## 2014-12-14 MED ORDER — MORPHINE SULFATE (PF) 2 MG/ML IV SOLN
2.0000 mg | INTRAVENOUS | Status: DC | PRN
  Administered 2014-12-14 (×3): 2 mg via INTRAVENOUS
  Filled 2014-12-14 (×3): qty 1

## 2014-12-14 MED ORDER — OXYCODONE-ACETAMINOPHEN 5-325 MG PO TABS
1.0000 | ORAL_TABLET | Freq: Four times a day (QID) | ORAL | Status: DC | PRN
  Administered 2014-12-14 – 2014-12-15 (×2): 2 via ORAL
  Administered 2014-12-15: 1 via ORAL
  Filled 2014-12-14: qty 2
  Filled 2014-12-14: qty 1
  Filled 2014-12-14: qty 2

## 2014-12-14 MED ORDER — SODIUM CHLORIDE 0.9 % IV SOLN
INTRAVENOUS | Status: DC
  Administered 2014-12-14 (×2): via INTRAVENOUS

## 2014-12-14 MED ORDER — ONDANSETRON HCL 4 MG PO TABS: 4.0000 mg | ORAL_TABLET | Freq: Four times a day (QID) | ORAL | Status: DC | PRN

## 2014-12-14 NOTE — H&P (Signed)
[facility] History and Physical  Reginald Green:[id number] DOB: 20-Jan-1945 DOA: 12/13/2014  Referring physician: Burna Forts, PA PCP: [name], MD   Chief Complaint: Left Buttock Pain  HPI: Reginald Green is a 68 y.o. male with a prior history of HTN Gout COPD/Asthma Chronic Atrial Fibrillation HLD who presents with pain in left buttocks. Patient states that about a week ago Sunday he bumped his buttocks into a sofa. Patient did not have any swelling at that time. Patient did have some pain at that time. Patient though it was a muscular strain at the time. Today he states that he picked up his grand daughter and began to have increased pain in his rear. Patient noted a bulge in the left buttocks come up rather quickly. Patient has also had decreased ROM and he has noted some tingling in his left lower calf area. His feet do not feel cold. Patient states that he has no fever or chills noted. He has no chest pain noted. He has had no weakness noted. In the ED it was thought inititially he had a spasm and was given a muscle relaxer. This did not help. A CT scan was done and this shows presence of left buttocks hematoma.   Review of Systems:  Complete ROS performed and is unremarkable other than HPI.   Past Medical History  Diagnosis Date  . Hypertension   . COPD (chronic obstructive pulmonary disease) (HCC)   . Gout   . Chronic atrial fibrillation (HCC)     Multiple failed cardioversions (even on flecanide). Normal LV function by echo in 2009 with LA 45mm, reported nonischemic myoview at that time  . Hyperlipidemia   . Gilbert's disease    Past Surgical History  Procedure Laterality Date  . Hernia repair      Undescended testicle brought down at this time in 1979  . Appendectomy      At time of hernia repair in 1979  . Tonsillectomy    . Colonoscopy  2011    neg, [facility], Due 2021   . Flexible sigmoidoscopy  05/12/1997   Social History:  reports that he quit  smoking about 27 years ago. His smoking use included Cigarettes. He has never used smokeless tobacco. He reports that he drinks about 8.4 oz of alcohol per week. He reports that he does not use illicit drugs.  No Known Allergies  Family History  Problem Relation Age of Onset  . Stroke Mother 78  . Hypertension Mother   . Heart attack Maternal Uncle 55  . Diabetes Maternal Grandmother   . Asthma Neg Hx   . COPD Neg Hx   . Heart attack Father 43     Prior to Admission medications   Medication Sig Start Date End Date Taking? Authorizing Provider  allopurinol (ZYLOPRIM) 300 MG tablet Take 0.5 tablets (150 mg total) by mouth daily. 05/09/14  Yes Pecola Lawless, MD  aspirin 81 MG tablet Take 81 mg by mouth daily.     Yes Historical Provider, MD  atorvastatin (LIPITOR) 20 MG tablet TAKE 1 TABLET BY MOUTH ON MONDAY, WEDNESDAY, FRIDAY, AND SUNDAY 05/09/14  Yes Pecola Lawless, MD  fluticasone [facility]) 50 MCG/ACT nasal spray Place 1 spray into the nose 2 (two) times daily. 05/05/12  Yes Pecola Lawless, MD  Fluticasone-Salmeterol (ADVAIR DISKUS) 250-50 MCG/DOSE AEPB INHALE 1 PUFF BY MOUTH EVERY 12 HOURS. 10/26/14  Yes Pecola Lawless, MD  lisinopril (PRINIVIL,ZESTRIL) 20 MG tablet Take 0.5  tablets (10 mg total) by mouth daily. 05/09/14  Yes Pecola Lawless, MD  metoprolol tartrate (LOPRESSOR) 25 MG tablet Take 0.5 tablets (12.5 mg total) by mouth 2 (two) times daily. 10/07/14  Yes Wendall Stade, MD  Multiple Vitamin (MULTIVITAMIN) tablet Take 1 tablet by mouth daily.     Yes Historical Provider, MD  rivaroxaban (XARELTO) 20 MG TABS tablet Take 1 tablet (20 mg total) by mouth daily with supper. 10/03/14  Yes Wendall Stade, MD   Physical Exam: Filed Vitals:   12/13/14 1704 12/13/14 1951 12/13/14 2239 12/13/14 2348  BP: 132/95 130/68 129/76 142/81  Pulse: 127 94 89 87  Temp: 97.3 F (36.3 C)  98.1 F (36.7 C)   TempSrc: Oral  Oral   Resp: SpO2: 100% 99% 100% 100%    Wt  Readings from Last 3 Encounters:  10/03/14 97.523 kg (215 lb)  05/09/14 96.163 kg (212 lb)  09/27/13 93.804 kg (206 lb 12.8 oz)    General:  Appears calm and comfortable Eyes: PERRL, normal lids, irises & conjunctiva ENT: grossly normal hearing, lips & tongue Neck: no LAD, masses or thyromegaly Cardiovascular: IRR, no m/r/g. No LE edema pulses equal Respiratory: CTA bilaterally, no w/r/r. Normal respiratory effort. Abdomen: soft, ntnd Skin: no rash or induration seen on limited exam Musculoskeletal: left buttocks grossly swollen. Has a rock hard feel to it. ROM decreased Psychiatric: grossly normal mood and affect Neurologic: grossly non-focal.          Labs on Admission:  Basic Metabolic Panel:  Recent Labs Lab 12/13/14 1908  NA 134*  K 4.3  CL 103  CO2 22  GLUCOSE 107*  BUN 18  CREATININE 0.98  CALCIUM 9.4   Liver Function Tests: No results for input(s): AST, ALT, ALKPHOS, BILITOT, PROT, ALBUMIN in the last 168 hours. No results for input(s): LIPASE, AMYLASE in the last 168 hours. No results for input(s): AMMONIA in the last 168 hours. CBC:  Recent Labs Lab 12/13/14 2241  WBC 13.5*  NEUTROABS 11.6*  HGB 14.2  HCT 39.4  MCV 95.2  PLT 224   Cardiac Enzymes:  Recent Labs Lab 12/13/14 1908  CKTOTAL 60    BNP (last 3 results) No results for input(s): BNP in the last 8760 hours.  ProBNP (last 3 results) No results for input(s): PROBNP in the last 8760 hours.  CBG: No results for input(s): GLUCAP in the last 168 hours.  Radiological Exams on Admission: Ct Pelvis W Contrast  12/13/2014  CLINICAL DATA:  69 year old male with concern for left gluteal hematoma. Patient is on Coumadin EXAM: CT PELVIS WITH CONTRAST TECHNIQUE: Multidetector CT imaging of the pelvis was performed using the standard protocol following the bolus administration of intravenous contrast. CONTRAST:  OMNIPAQUE IOHEXOL 300 MG/ML  SOLN COMPARISON:  CT dated 08/06/2010 FINDINGS:  No free air or fluid identified within the pelvis. The urinary bladder, prostate gland, and seminal vesicles are grossly unremarkable. There is sigmoid diverticulosis without active inflammation. The visualized appendix appears unremarkable. There is aortoiliac atherosclerotic disease. No adenopathy noted within the pelvis. Old healed bilateral pubic bone fractures. No acute fracture. There is a left gluteal intramuscular hematoma measuring approximately 7.1 x 10.7 cm in greatest axial dimensions. Follow-up recommended. IMPRESSION: Left gluteal intramuscular hematoma.  Follow-up recommended. Electronically Signed   By: Elgie Collard M.D.   On: 12/13/2014 23:18   Dg Hip Unilat With Pelvis 2-3 Views Left  12/13/2014  CLINICAL DATA:  Fall.  Left buttock pain.  Left hip pain. EXAM: DG HIP (WITH OR WITHOUT PELVIS) 2-3V LEFT COMPARISON:  None available. FINDINGS: Left hip is located. No acute bone or soft tissue abnormalities are present. Atherosclerotic changes are present. The pelvis is intact. Asymmetric right-sided degenerative changes are noted in the lower lumbar spine IMPRESSION: 1. No acute abnormality of the left hip. 2. Degenerative changes in the lower lumbar spine. Electronically Signed   By: Marin Robertshristopher  Mattern M.D.   On: 12/13/2014 17:43      Assessment/Plan Principal Problem:   Traumatic hematoma of buttock Active Problems:   Atrial fibrillation (HCC)   Asthma   Hyperlipidemia   Hypertension   1. Hematoma of the left Buttocks -will hold xarelto -Hold aspirin -pain control as needed -neurovascular checks -consider general Surgery evaluation (ED has Called they will see in am Dr Samson Fredericosebauer)  2. Atrial Fibrillation -will place on telemetry -holding anticoagulation at this time  3. [facility]D -[facility]  4. Hyperlipidemia -on lipitor  5. Hypertension -continue with lisinopril     Code Status: full code DVT Prophylaxis:SCD Family  Communication: wife Disposition Plan: home    [facility]aleKHAN,Dawid Dupriest A [facility] Pager [phone]

## 2014-12-14 NOTE — Progress Notes (Signed)
[facility] PROGRESS NOTE   Reginald HivesJohn B Green ZOX:[id number] DOB: 12/29/1945 DOA: 12/13/2014 PCP: Marga MelnickWilliam Hopper, MD  HPI/Subjective: Seen with wife at bedside, complains about left arm pain.  Assessment/Plan: Principal Problem:   Traumatic hematoma of buttock Active Problems:   Atrial fibrillation (HCC)   Asthma   Hyperlipidemia   Hypertension    This is a no charge note, patient seen earlier today by my colleague Dr. Welton FlakesKhan Patient seen and examined, and data base reviewed. Admitted for left gluteal hematoma after minimal trauma. General surgery evaluated the patient, appreciate their help, recommended ice packs, control the pain and hold anticoagulants. Light activities  Code Status: Full Code Family Communication: Plan discussed with the patient. Disposition Plan: Remains inpatient Diet: Diet Heart Room service appropriate?: Yes; Fluid consistency:: Thin  Consultants:  None  Procedures:  None  Antibiotics:  None   Objective: Filed Vitals:   12/14/14 1003 12/14/14 1130  BP: 111/76 123/78  Pulse: 89 83  Temp:  98.3 F (36.8 C)  Resp:      Intake/Output Summary (Last 24 hours) at 12/14/14 1401 Last data filed at 12/14/14 0913  Gross per 24 hour  Intake 608.33 ml  Output      0 ml  Net 608.33 ml   Filed Weights   12/14/14 0148  Weight: 93.7 kg (206 lb 9.1 oz)    Exam: General: Alert and awake, oriented x3, not in any acute distress. HEENT: anicteric sclera, pupils reactive to light and accommodation, EOMI CVS: S1-S2 clear, no murmur rubs or gallops Chest: clear to auscultation bilaterally, no wheezing, rales or rhonchi Abdomen: soft nontender, nondistended, normal bowel sounds, no organomegaly Extremities: no cyanosis, clubbing or edema noted bilaterally Neuro: Cranial nerves II-XII intact, no focal neurological deficits  Data Reviewed: Basic Metabolic Panel:  Recent Labs Lab 12/13/14 1908 12/14/14 0455  NA 134* 138  K 4.3 3.6    CL 103 103  CO2 22 27  GLUCOSE 107* 110*  BUN 18 16  CREATININE 0.98 1.12  CALCIUM 9.4 9.3   Liver Function Tests:  Recent Labs Lab 12/14/14 0455  AST 25  ALT 22  ALKPHOS 45  BILITOT 1.0  PROT 6.2*  ALBUMIN 3.3*   No results for input(s): LIPASE, AMYLASE in the last 168 hours. No results for input(s): AMMONIA in the last 168 hours. CBC:  Recent Labs Lab 12/13/14 2241 12/14/14 0455  WBC 13.5* 9.6  NEUTROABS 11.6*  --   HGB 14.2 13.3  HCT 39.4 37.8*  MCV 95.2 96.7  PLT 224 225   Cardiac Enzymes:  Recent Labs Lab 12/13/14 1908  CKTOTAL 60   BNP (last 3 results) No results for input(s): BNP in the last 8760 hours.  ProBNP (last 3 results) No results for input(s): PROBNP in the last 8760 hours.  CBG:  Recent Labs Lab 12/14/14 0912  GLUCAP 126*    Micro No results found for this or any previous visit (from the past 240 hour(s)).   Studies: Ct Pelvis W Contrast  12/13/2014  CLINICAL DATA:  69 year old male with concern for left gluteal hematoma. Patient is on Coumadin EXAM: CT PELVIS WITH CONTRAST TECHNIQUE: Multidetector CT imaging of the pelvis was performed using the standard protocol following the bolus administration of intravenous contrast. CONTRAST:  100mL OMNIPAQUE IOHEXOL 300 MG/ML  SOLN COMPARISON:  CT dated 08/06/2010 FINDINGS: No free air or fluid identified within the pelvis. The urinary bladder, prostate gland, and seminal vesicles are grossly unremarkable. There is sigmoid diverticulosis without  active inflammation. The visualized appendix appears unremarkable. There is aortoiliac atherosclerotic disease. No adenopathy noted within the pelvis. Old healed bilateral pubic bone fractures. No acute fracture. There is a left gluteal intramuscular hematoma measuring approximately 7.1 x 10.7 cm in greatest axial dimensions. Follow-up recommended. IMPRESSION: Left gluteal intramuscular hematoma.  Follow-up recommended. Electronically Signed   By: Elgie Collard M.D.   On: 12/13/2014 23:18   Dg Hip Unilat With Pelvis 2-3 Views Left  12/13/2014  CLINICAL DATA:  Fall.  Left buttock pain.  Left hip pain. EXAM: DG HIP (WITH OR WITHOUT PELVIS) 2-3V LEFT COMPARISON:  None available. FINDINGS: Left hip is located. No acute bone or soft tissue abnormalities are present. Atherosclerotic changes are present. The pelvis is intact. Asymmetric right-sided degenerative changes are noted in the lower lumbar spine IMPRESSION: 1. No acute abnormality of the left hip. 2. Degenerative changes in the lower lumbar spine. Electronically Signed   By: Marin Roberts M.D.   On: 12/13/2014 17:43    Scheduled Meds: . allopurinol  150 mg Oral Daily  . atorvastatin  20 mg Oral q1800  . docusate sodium  100 mg Oral BID  . fluticasone  1 spray Each Nare BID  . folic acid  1 mg Oral Daily  . lisinopril  10 mg Oral Daily  . metoprolol tartrate  12.5 mg Oral BID  . mometasone-formoterol  2 puff Inhalation BID  . multivitamin with minerals  1 tablet Oral Daily  . sodium chloride  3 mL Intravenous Q12H  . thiamine  100 mg Oral Daily   Continuous Infusions: . sodium chloride 50 mL/hr at 12/14/14 0146       Time spent: 35 minutes    [facility] A  [facility] Pager [phone] If 7PM-7AM, please contact night-coverage at [email or web address], password [facility] 12/14/2014, 2:01 PM

## 2014-12-14 NOTE — Consult Note (Signed)
Reason for Consult:Left gluteal intramuscular hematoma Referring Physician:  Dr. Hollice Espy Oliveri is an 69 y.o. male.  HPI: this is a 69 year old male admitted with a left gluteal intramuscular hematoma. 10 days ago, he was helping to move a piece of furniture and set on the end of the furniture on the left buttock and felt immediate pain. He did this a second time during her 1 hour span. He did not notice any swelling. Yesterday however he noticed abrupt onset of pain and swelling in the left gluteal area. He is on Xarelto and Aspirin or chronic atrial fibrillation.He presented to the emergency department and a CT was performed which demonstrated findings consistent with a left gluteal intramuscular hematoma. We were asked to see him because of this. He stated he did not notice any outward signs of bruising just the swelling and pain.  Past Medical History  Diagnosis Date  . Hypertension   . COPD (chronic obstructive pulmonary disease) ([place])   . Gout   . Chronic atrial fibrillation (HCC)     Multiple failed cardioversions (even on flecanide). Normal LV function by echo in 2009 with LA 61m, reported nonischemic myoview at that time  . Hyperlipidemia   . Gilbert's disease     Past Surgical History  Procedure Laterality Date  . Hernia repair      Undescended testicle brought down at this time in 1979  . Appendectomy      At time of hernia repair in 1979  . Tonsillectomy    . Colonoscopy  2011    neg, [facility], Due 2021   . Flexible sigmoidoscopy  05/12/1997    Family History  Problem Relation Age of Onset  . Stroke Mother 858 . Hypertension Mother   . Heart attack Maternal Uncle 55  . Diabetes Maternal Grandmother   . Asthma Neg Hx   . COPD Neg Hx   . Heart attack Father 731   Social History:  reports that he quit smoking about 27 years ago. His smoking use included Cigarettes. He has never used smokeless tobacco. He reports that he drinks about 8.4 oz of alcohol per  week. He reports that he does not use illicit drugs.  Allergies: No Known Allergies  Prior to Admission medications   Medication Sig Start Date End Date Taking? Authorizing Provider  allopurinol (ZYLOPRIM) 300 MG tablet Take 0.5 tablets (150 mg total) by mouth daily. 05/09/14  Yes WHendricks Limes MD  aspirin 81 MG tablet Take 81 mg by mouth daily.     Yes Historical Provider, MD  atorvastatin (LIPITOR) 20 MG tablet TAKE 1 TABLET BY MOUTH ON MONDAY, WEDNESDAY, FRIDAY, AND SUNDAY 05/09/14  Yes WHendricks Limes MD  fluticasone ([facility] 50 MCG/ACT nasal spray Place 1 spray into the nose 2 (two) times daily. 05/05/12  Yes WHendricks Limes MD  Fluticasone-Salmeterol (ADVAIR DISKUS) 250-50 MCG/DOSE AEPB INHALE 1 PUFF BY MOUTH EVERY 12 HOURS. 10/26/14  Yes WHendricks Limes MD  lisinopril (PRINIVIL,ZESTRIL) 20 MG tablet Take 0.5 tablets (10 mg total) by mouth daily. 05/09/14  Yes WHendricks Limes MD  metoprolol tartrate (LOPRESSOR) 25 MG tablet Take 0.5 tablets (12.5 mg total) by mouth 2 (two) times daily. 10/07/14  Yes PJosue Hector MD  Multiple Vitamin (MULTIVITAMIN) tablet Take 1 tablet by mouth daily.     Yes Historical Provider, MD  rivaroxaban (XARELTO) 20 MG TABS tablet Take 1 tablet (20 mg total) by mouth daily with supper. 10/03/14  Yes Josue Hector, MD     Results for orders placed or performed during the hospital encounter of 12/13/14 (from the past 48 hour(s))  Basic metabolic panel     Status: Abnormal   Collection Time: 12/13/14  7:08 PM  Result Value Ref Range   Sodium 134 (L) 135 - 145 mmol/L   Potassium 4.3 3.5 - 5.1 mmol/L   Chloride 103 101 - 111 mmol/L   CO2 22 22 - 32 mmol/L   Glucose, Bld 107 (H) 65 - 99 mg/dL   BUN 18 6 - 20 mg/dL   Creatinine, Ser 0.98 0.61 - 1.24 mg/dL   Calcium 9.4 8.9 - 10.3 mg/dL   GFR calc non Af Amer >60 >60 mL/min   GFR calc Af Amer >60 >60 mL/min    Comment: (NOTE) The eGFR has been calculated using the CKD EPI equation. This calculation has  not been validated in all clinical situations. eGFR's persistently <60 mL/min signify possible Chronic Kidney Disease.    Anion gap 9 5 - 15  CK     Status: None   Collection Time: 12/13/14  7:08 PM  Result Value Ref Range   Total CK 60 49 - 397 U/L  Protime-INR     Status: Abnormal   Collection Time: 12/13/14 10:41 PM  Result Value Ref Range   Prothrombin Time 15.7 (H) 11.6 - 15.2 seconds   INR 1.23 0.00 - 1.49  CBC with Differential     Status: Abnormal   Collection Time: 12/13/14 10:41 PM  Result Value Ref Range   WBC 13.5 (H) 4.0 - 10.5 K/uL   RBC 4.14 (L) 4.22 - 5.81 MIL/uL   Hemoglobin 14.2 13.0 - 17.0 g/dL   HCT 39.4 39.0 - 52.0 %   MCV 95.2 78.0 - 100.0 fL   MCH 34.3 (H) 26.0 - 34.0 pg   MCHC 36.0 30.0 - 36.0 g/dL   RDW 12.5 11.5 - 15.5 %   Platelets 224 150 - 400 K/uL   Neutrophils Relative % 87 %   Neutro Abs 11.6 (H) 1.7 - 7.7 K/uL   Lymphocytes Relative 8 %   Lymphs Abs 1.1 0.7 - 4.0 K/uL   Monocytes Relative 5 %   Monocytes Absolute 0.7 0.1 - 1.0 K/uL   Eosinophils Relative 0 %   Eosinophils Absolute 0.0 0.0 - 0.7 K/uL   Basophils Relative 0 %   Basophils Absolute 0.1 0.0 - 0.1 K/uL  CBC     Status: Abnormal   Collection Time: 12/14/14  4:55 AM  Result Value Ref Range   WBC 9.6 4.0 - 10.5 K/uL   RBC 3.91 (L) 4.22 - 5.81 MIL/uL   Hemoglobin 13.3 13.0 - 17.0 g/dL   HCT 37.8 (L) 39.0 - 52.0 %   MCV 96.7 78.0 - 100.0 fL   MCH 34.0 26.0 - 34.0 pg   MCHC 35.2 30.0 - 36.0 g/dL   RDW 12.7 11.5 - 15.5 %   Platelets 225 150 - 400 K/uL  Protime-INR     Status: None   Collection Time: 12/14/14  4:55 AM  Result Value Ref Range   Prothrombin Time 15.2 11.6 - 15.2 seconds   INR 1.18 0.00 - 1.49  APTT     Status: None   Collection Time: 12/14/14  4:55 AM  Result Value Ref Range   aPTT 32 24 - 37 seconds  Comprehensive metabolic panel     Status: Abnormal   Collection Time: 12/14/14  4:55 AM  Result Value Ref Range   Sodium 138 135 - 145 mmol/L   Potassium 3.6  3.5 - 5.1 mmol/L   Chloride 103 101 - 111 mmol/L   CO2 27 22 - 32 mmol/L   Glucose, Bld 110 (H) 65 - 99 mg/dL   BUN 16 6 - 20 mg/dL   Creatinine, Ser 1.12 0.61 - 1.24 mg/dL   Calcium 9.3 8.9 - 10.3 mg/dL   Total Protein 6.2 (L) 6.5 - 8.1 g/dL   Albumin 3.3 (L) 3.5 - 5.0 g/dL   AST 25 15 - 41 U/L   ALT 22 17 - 63 U/L   Alkaline Phosphatase 45 38 - 126 U/L   Total Bilirubin 1.0 0.3 - 1.2 mg/dL   GFR calc non Af Amer >60 >60 mL/min   GFR calc Af Amer >60 >60 mL/min    Comment: (NOTE) The eGFR has been calculated using the CKD EPI equation. This calculation has not been validated in all clinical situations. eGFR's persistently <60 mL/min signify possible Chronic Kidney Disease.    Anion gap 8 5 - 15  TSH     Status: None   Collection Time: 12/14/14  4:55 AM  Result Value Ref Range   TSH 2.731 0.350 - 4.500 uIU/mL  Glucose, capillary     Status: Abnormal   Collection Time: 12/14/14  9:12 AM  Result Value Ref Range   Glucose-Capillary 126 (H) 65 - 99 mg/dL    Ct Pelvis W Contrast  12/13/2014  CLINICAL DATA:  69 year old male with concern for left gluteal hematoma. Patient is on Coumadin EXAM: CT PELVIS WITH CONTRAST TECHNIQUE: Multidetector CT imaging of the pelvis was performed using the standard protocol following the bolus administration of intravenous contrast. CONTRAST:  128m OMNIPAQUE IOHEXOL 300 MG/ML  SOLN COMPARISON:  CT dated 08/06/2010 FINDINGS: No free air or fluid identified within the pelvis. The urinary bladder, prostate gland, and seminal vesicles are grossly unremarkable. There is sigmoid diverticulosis without active inflammation. The visualized appendix appears unremarkable. There is aortoiliac atherosclerotic disease. No adenopathy noted within the pelvis. Old healed bilateral pubic bone fractures. No acute fracture. There is a left gluteal intramuscular hematoma measuring approximately 7.1 x 10.7 cm in greatest axial dimensions. Follow-up recommended. IMPRESSION: Left  gluteal intramuscular hematoma.  Follow-up recommended. Electronically Signed   By: AAnner CreteM.D.   On: 12/13/2014 23:18   Dg Hip Unilat With Pelvis 2-3 Views Left  12/13/2014  CLINICAL DATA:  Fall.  Left buttock pain.  Left hip pain. EXAM: DG HIP (WITH OR WITHOUT PELVIS) 2-3V LEFT COMPARISON:  None available. FINDINGS: Left hip is located. No acute bone or soft tissue abnormalities are present. Atherosclerotic changes are present. The pelvis is intact. Asymmetric right-sided degenerative changes are noted in the lower lumbar spine IMPRESSION: 1. No acute abnormality of the left hip. 2. Degenerative changes in the lower lumbar spine. Electronically Signed   By: CSan MorelleM.D.   On: 12/13/2014 17:43    Review of Systems  Cardiovascular: Negative for chest pain.  Musculoskeletal: Positive for myalgias (left gluteal area).  Neurological: Negative for weakness.   Blood pressure 113/68, pulse 75, temperature 98.3 F (36.8 C), temperature source Oral, resp. rate 20, height 6' 3"  (1.905 m), weight 93.7 kg (206 lb 9.1 oz), SpO2 98 %. Physical Exam  Constitutional: He appears well-developed and well-nourished. No distress.  Musculoskeletal:  18-20 cm x 18-20 cm from left gluteal mass that is tender. No redness or ecchymosis.  Neurological: He is  alert.  Psychiatric: He has a normal mood and affect. His behavior is normal.    Assessment/Plan: Left gluteal intramuscular hematoma secondary to trauma and chronic anticoagulation.  No indication for operative intervention.  Recommendation: Ice to the area aggressively for the next 72 hours. Do not aspirate hematoma as this could lead to increased risk of infection. Hold anticoagulants including aspirin for 1 week after it has expanded to maximum size.  I explained to him and his wife that it could take months for the swelling to go down.  Light activities until the swelling goes down.  Kaedence Connelly J 12/14/2014, 9:32 AM

## 2014-12-15 DIAGNOSIS — J4520 Mild intermittent asthma, uncomplicated: Secondary | ICD-10-CM

## 2014-12-15 DIAGNOSIS — E785 Hyperlipidemia, unspecified: Secondary | ICD-10-CM | POA: Diagnosis not present

## 2014-12-15 DIAGNOSIS — I10 Essential (primary) hypertension: Secondary | ICD-10-CM

## 2014-12-15 DIAGNOSIS — I482 Chronic atrial fibrillation: Secondary | ICD-10-CM

## 2014-12-15 DIAGNOSIS — S300XXA Contusion of lower back and pelvis, initial encounter: Secondary | ICD-10-CM | POA: Diagnosis not present

## 2014-12-15 LAB — GLUCOSE, CAPILLARY: GLUCOSE-CAPILLARY: 98 mg/dL (ref 65–99)

## 2014-12-15 LAB — HEMOGLOBIN A1C
Hgb A1c MFr Bld: 5.8 % — ABNORMAL HIGH (ref 4.8–5.6)
MEAN PLASMA GLUCOSE: 120 mg/dL

## 2014-12-15 MED ORDER — OXYCODONE-ACETAMINOPHEN 5-325 MG PO TABS: 1.0000 | ORAL_TABLET | Freq: Four times a day (QID) | ORAL | Status: DC | PRN

## 2014-12-15 NOTE — Discharge Summary (Signed)
Physician Discharge Summary  Reginald Green UEA:[id number] DOB: [birth year] DOA: 12/13/2014  PCP: [name], MD  Admit date: 12/13/2014 Discharge date: 12/15/2014  Time spent: 40 minutes  Recommendations for Outpatient Follow-up:  1. Follow-up with Dr. Alwyn Ren within1 week. 2. Hold aspirin and Xarelto for 7 more days.   Discharge Diagnoses:  Principal Problem:   Traumatic hematoma of buttock Active Problems:   Atrial fibrillation (HCC)   Asthma   Hyperlipidemia   Hypertension   Discharge Condition: Stable  Diet recommendation: Heart healthy  Filed Weights   12/14/14 0148  Weight: 93.7 kg (206 lb 9.1 oz)    History of present illness:  Reginald Green is a 69 y.o. male with a prior history of HTN Gout COPD/Asthma Chronic Atrial Fibrillation HLD who presents with pain in left buttocks. Patient states that about a week ago Sunday he bumped his buttocks into a sofa. Patient did not have any swelling at that time. Patient did have some pain at that time. Patient though it was a muscular strain at the time. Today he states that he picked up his grand daughter and began to have increased pain in his rear. Patient noted a bulge in the left buttocks come up rather quickly. Patient has also had decreased ROM and he has noted some tingling in his left lower calf area. His feet do not feel cold. Patient states that he has no fever or chills noted. He has no chest pain noted. He has had no weakness noted. In the ED it was thought inititially he had a spasm and was given a muscle relaxer. This did not help. A CT scan was done and this shows presence of left buttocks hematoma.  Hospital Course:   1. Hematoma of the left Buttocks Patient presented with pain in the left buttock, had minimal trauma in the setting of anticoagulation. CT scan showed hematoma of the left gluteal hematoma 710 cm, causing severe pain. Started on morphine and Percocet for pain control. General surgery consulted  recommended ice packs, minimal exercise and pain control. Discussed with Dr. Purnell Shoemaker, recommends to hold antibiotics for 7 more days. Continues to have significant pain, prescription of Percocet 5/325 of 30 tablets given.  2. Atrial Fibrillation Chronic atrial fibrillation, patient is on aspirin and Xarelto. Held both, continue to hold anticoagulation for 7 more days, when restarting restart Xarelto alone, does not have indication for aspirin.  This patients CHA2DS2-VASc Score and unadjusted Ischemic Stroke Rate (% per year) is equal to 2.2 % stroke rate/year from a score of 2 for hypertension and age Above score calculated as 1 point each if present [CHF, HTN, DM, Vascular=MI/PAD/Aortic Plaque, Age if 64-74, or Male] Above score calculated as 2 points each if present [Age > 75, or Stroke/TIA/TE]  3. Asthma/COPD -will continue with [facility] for now  4. Hyperlipidemia -on lipitor  5. Hypertension -continue with lisinopril   Procedures:  None  Consultations:  General surgery  Discharge Exam: Filed Vitals:   12/14/14 2100 12/15/14 0545  BP: 115/67 106/64  Pulse: 81 83  Temp: 98.3 F (36.8 C) 98.3 F (36.8 C)  Resp: 16 18   General: Alert and awake, oriented x3, not in any acute distress. HEENT: anicteric sclera, pupils reactive to light and accommodation, EOMI CVS: S1-S2 clear, no murmur rubs or gallops Chest: clear to auscultation bilaterally, no wheezing, rales or rhonchi Abdomen: soft nontender, nondistended, normal bowel sounds, no organomegaly Extremities: no cyanosis, clubbing or edema noted bilaterally Neuro: Cranial nerves II-XII  intact, no focal neurological deficits  Discharge Instructions    Current Discharge Medication List    START taking these medications   Details  oxyCODONE-acetaminophen (PERCOCET/ROXICET) 5-325 MG tablet Take 1 tablet by mouth every 6 (six) hours as needed for moderate pain or severe pain. Qty: 30 tablet, Refills: 0       CONTINUE these medications which have NOT CHANGED   Details  allopurinol (ZYLOPRIM) 300 MG tablet Take 0.5 tablets (150 mg total) by mouth daily. Qty: 45 tablet, Refills: 3    atorvastatin (LIPITOR) 20 MG tablet TAKE 1 TABLET BY MOUTH ON MONDAY, WEDNESDAY, FRIDAY, AND SUNDAY Qty: 51 tablet, Refills: 3    fluticasone (FLONASE) 50 MCG/ACT nasal spray Place 1 spray into the nose 2 (two) times daily. Qty: 1 g, Refills: 11    Fluticasone-Salmeterol (ADVAIR DISKUS) 250-50 MCG/DOSE AEPB INHALE 1 PUFF BY MOUTH EVERY 12 HOURS. Qty: 60 each, Refills: 3    lisinopril (PRINIVIL,ZESTRIL) 20 MG tablet Take 0.5 tablets (10 mg total) by mouth daily. Qty: 45 tablet, Refills: 3    metoprolol tartrate (LOPRESSOR) 25 MG tablet Take 0.5 tablets (12.5 mg total) by mouth 2 (two) times daily. Qty: 90 tablet, Refills: 3    Multiple Vitamin (MULTIVITAMIN) tablet Take 1 tablet by mouth daily.        STOP taking these medications     aspirin 81 MG tablet      rivaroxaban (XARELTO) 20 MG TABS tablet        No Known Allergies Follow-up Information    Follow up with Marga MelnickWilliam Hopper, MD In 1 week.   Specialty:  Internal Medicine   Contact information:   [address] [place] [address] [phone]        The results of significant diagnostics from this hospitalization (including imaging, microbiology, ancillary and laboratory) are listed below for reference.    Significant Diagnostic Studies: Ct Pelvis W Contrast  12/13/2014  CLINICAL DATA:  [age] year old male with concern for left gluteal hematoma. Patient is on Coumadin EXAM: CT PELVIS WITH CONTRAST TECHNIQUE: Multidetector CT imaging of the pelvis was performed using the standard protocol following the bolus administration of intravenous contrast. CONTRAST:  100mL OMNIPAQUE IOHEXOL 300 MG/ML  SOLN COMPARISON:  CT dated 08/06/2010 FINDINGS: No free air or fluid identified within the pelvis. The urinary bladder, prostate gland, and seminal  vesicles are grossly unremarkable. There is sigmoid diverticulosis without active inflammation. The visualized appendix appears unremarkable. There is aortoiliac atherosclerotic disease. No adenopathy noted within the pelvis. Old healed bilateral pubic bone fractures. No acute fracture. There is a left gluteal intramuscular hematoma measuring approximately 7.1 x 10.7 cm in greatest axial dimensions. Follow-up recommended. IMPRESSION: Left gluteal intramuscular hematoma.  Follow-up recommended. Electronically Signed   By: Elgie CollardArash  Radparvar M.D.   On: 12/13/2014 23:18   Dg Hip Unilat With Pelvis 2-3 Views Left  12/13/2014  CLINICAL DATA:  Fall.  Left buttock pain.  Left hip pain. EXAM: DG HIP (WITH OR WITHOUT PELVIS) 2-3V LEFT COMPARISON:  None available. FINDINGS: Left hip is located. No acute bone or soft tissue abnormalities are present. Atherosclerotic changes are present. The pelvis is intact. Asymmetric right-sided degenerative changes are noted in the lower lumbar spine IMPRESSION: 1. No acute abnormality of the left hip. 2. Degenerative changes in the lower lumbar spine. Electronically Signed   By: Marin Robertshristopher  Mattern M.D.   On: 12/13/2014 17:43    Microbiology: No results found for this or any previous visit (from the past  240 hour(s)).   Labs: Basic Metabolic Panel:  Recent Labs Lab 12/13/14 1908 12/14/14 0455  NA 134* 138  K 4.3 3.6  CL 103 103  CO2 22 27  GLUCOSE 107* 110*  BUN 18 16  CREATININE 0.98 1.12  CALCIUM 9.4 9.3   Liver Function Tests:  Recent Labs Lab 12/14/14 0455  AST 25  ALT 22  ALKPHOS 45  BILITOT 1.0  PROT 6.2*  ALBUMIN 3.3*   No results for input(s): LIPASE, AMYLASE in the last 168 hours. No results for input(s): AMMONIA in the last 168 hours. CBC:  Recent Labs Lab 12/13/14 2241 12/14/14 0455  WBC 13.5* 9.6  NEUTROABS 11.6*  --   HGB 14.2 13.3  HCT 39.4 37.8*  MCV 95.2 96.7  PLT 224 225   Cardiac Enzymes:  Recent Labs Lab  12/13/14 1908  CKTOTAL 60   BNP: BNP (last 3 results) No results for input(s): BNP in the last 8760 hours.  ProBNP (last 3 results) No results for input(s): PROBNP in the last 8760 hours.  CBG:  Recent Labs Lab 12/14/14 0912 12/15/14 0732  GLUCAP 126* 98       Signed:  Andren Bethea A  [facility] 12/15/2014, 9:20 AM

## 2014-12-16 ENCOUNTER — Telehealth: Payer: Self-pay | Admitting: *Deleted

## 2014-12-16 NOTE — Telephone Encounter (Signed)
Transition Care Management Follow-up Telephone Call   Date discharged? 12/15/14   How have you been since you were released from the hospital? Pt states he is doing ok still having a little pain but been applying ice as md recommended   Do you understand why you were in the hospital? YES   Do you understand the discharge instructions? YES   Where were you discharged to? Home   Items Reviewed:  Medications reviewed: YES  Allergies reviewed: YES  Dietary changes reviewed: NO  Referrals reviewed: No referral needed   Functional Questionnaire:   Activities of Daily Living (ADLs):   He states he are independent in the following: ambulation, bathing and hygiene, feeding, continence, grooming, toileting and dressing States he doesn't require assistance    Any transportation issues/concerns?: NO   Any patient concerns? NO   Confirmed importance and date/time of follow-up visits scheduled YES, appt 12/23/14  Provider Appointment booked with Dr. Alwyn RenHopper  Confirmed with patient if condition begins to worsen call PCP or go to the ER.  Patient was given the office number and encouraged to call back with question or concerns.  : YES

## 2014-12-19 ENCOUNTER — Inpatient Hospital Stay: Payer: Medicare Other | Admitting: Internal Medicine

## 2014-12-23 ENCOUNTER — Other Ambulatory Visit (INDEPENDENT_AMBULATORY_CARE_PROVIDER_SITE_OTHER): Payer: Medicare Other

## 2014-12-23 ENCOUNTER — Ambulatory Visit (INDEPENDENT_AMBULATORY_CARE_PROVIDER_SITE_OTHER): Payer: Medicare Other | Admitting: Internal Medicine

## 2014-12-23 ENCOUNTER — Encounter: Payer: Self-pay | Admitting: Internal Medicine

## 2014-12-23 VITALS — BP 132/86 | HR 86 | Temp 98.3°F | Resp 20 | Ht 75.0 in | Wt 211.0 lb

## 2014-12-23 DIAGNOSIS — Z1159 Encounter for screening for other viral diseases: Secondary | ICD-10-CM

## 2014-12-23 DIAGNOSIS — S300XXD Contusion of lower back and pelvis, subsequent encounter: Secondary | ICD-10-CM

## 2014-12-23 DIAGNOSIS — I482 Chronic atrial fibrillation, unspecified: Secondary | ICD-10-CM

## 2014-12-23 LAB — CBC WITH DIFFERENTIAL/PLATELET
BASOS ABS: 0.1 10*3/uL (ref 0.0–0.1)
Basophils Relative: 0.5 % (ref 0.0–3.0)
Eosinophils Absolute: 0.1 10*3/uL (ref 0.0–0.7)
Eosinophils Relative: 0.6 % (ref 0.0–5.0)
HCT: 40.3 % (ref 39.0–52.0)
HEMOGLOBIN: 13.6 g/dL (ref 13.0–17.0)
LYMPHS ABS: 1.2 10*3/uL (ref 0.7–4.0)
Lymphocytes Relative: 8.6 % — ABNORMAL LOW (ref 12.0–46.0)
MCHC: 33.8 g/dL (ref 30.0–36.0)
MCV: 99.6 fl (ref 78.0–100.0)
MONOS PCT: 7.1 % (ref 3.0–12.0)
Monocytes Absolute: 1 10*3/uL (ref 0.1–1.0)
NEUTROS PCT: 83.2 % — AB (ref 43.0–77.0)
Neutro Abs: 11.3 10*3/uL — ABNORMAL HIGH (ref 1.4–7.7)
Platelets: 383 10*3/uL (ref 150.0–400.0)
RBC: 4.05 Mil/uL — AB (ref 4.22–5.81)
RDW: 12.4 % (ref 11.5–15.5)
WBC: 13.6 10*3/uL — AB (ref 4.0–10.5)

## 2014-12-23 MED ORDER — TRAMADOL HCL 50 MG PO TABS: 50.0000 mg | ORAL_TABLET | Freq: Three times a day (TID) | ORAL | Status: DC | PRN

## 2014-12-23 NOTE — Progress Notes (Signed)
Pre visit review using our clinic review tool, if applicable. No additional management support is needed unless otherwise documented below in the visit note. 

## 2014-12-23 NOTE — Progress Notes (Signed)
   Subjective:    Patient ID: Reginald Green, male    DOB: 06/08/1945, 69 y.o.   MRN: [id number]  HPI   He was hospitalized 12/6-12/8/16 with a posttraumatic hematoma of the left buttocks. Approximately 11/30 he had sat down on the armrest of the couch and noted discomfort. Subsequently the pain progressed after he lifted his 12 pound granddaughter. Extensive buttock bruising and a hematoma 7 x 10 cm was noted. He was taken off his aspirin and Xarelto; he has continued to be off this since discharge; but he subsequently developed some additional  Bruising in the lumbosacral area as well. He has no other bleeding dyscrasias.  Pain was treated with morphine and then Percocet. He has stopped the Percocet as it made him feel uncomfortable mentally.  He is requesting testing for hepatitis C.  Review of Systems Epistaxis, hemoptysis, hematuria, melena, or rectal bleeding denied. No unexplained weight loss, significant dyspepsia,dysphagia, or abdominal pain.   Chest pain, palpitations, tachycardia, exertional dyspnea, paroxysmal nocturnal dyspnea, claudication or edema are absent.      Objective:   Physical Exam  Pertinent or positive findings include: He has a slow, slightly irregular rate. Dorsalis pedis pulses are slightly decreased. There is slight induration subcutaneously over the left superior buttock.There is an area faint ecchymosis over the lateral thigh measuring 14.5 x 6 cm. There is a similar area of faint ecchymosis over the lumbosacral area measuring 12 x 10.5 cm. He expresses discomfort when he moves the left lower extremity with ROM.  General appearance :adequately nourished; in no distress.  Eyes: No conjunctival inflammation or scleral icterus is present.  Heart:   S1 and S2 normal without gallop, murmur, click, rub or other extra sounds    Lungs:Chest clear to auscultation; no wheezes, rhonchi,rales ,or rubs present.No increased work of breathing.   Abdomen: bowel sounds  normal, soft and non-tender without masses, organomegaly or hernias noted.  No guarding or rebound. .  Vascular : all pulses equal ; no bruits present.  Skin:Warm & dry.  Intact without suspicious lesions or rashes ; no tenting or jaundice   Lymphatic: No lymphadenopathy is noted about the head, neck, axilla.   Neuro: Strength, tone & DTRs normal.       Assessment & Plan:  #1 post traumatic hematoma in the context of ASA and Xarelto See orders

## 2014-12-23 NOTE — Patient Instructions (Signed)
Fill tub with hot water and soak in it two- three times a day to help relieve the soft tissue/musculoskeletal pain and facilitate resolution of the hematoma. Use a rubber doughnut when sitting for prolonged period time to prevent discomfort in the buttocks.

## 2014-12-24 LAB — HEPATITIS C ANTIBODY: HCV Ab: NEGATIVE

## 2015-01-03 NOTE — Progress Notes (Signed)
Patient ID: MARSHAUN LORTIE, male   DOB: Sep 13, 1945, 69 y.o.   MRN: [id number] WENDELL NICOSON is a 69 y.o.  male who presents for follow up . He has a history of chronic atrial fibrillation on  anticoagulation, hypertension, COPD, gout, hyperlipidemia. He was seen in the emergency room 01/10/11 with chest discomfort and episodic dizziness. He also noted a spinning sensation at times. Cardiac markers were negative in the emergency room. Potassium was 3.9, creatinine 0.9, hemoglobin 14.6. Chest x-ray demonstrated chronically elevated left hemidiaphragm, borderline cardiac enlargement, pulmonary vascularity questionable for developing vascular congestion, no active consolidation. Patient was seen by Dr. Johney Frame. He felt the patient could be evaluated as an outpatient with an echocardiogram, myoview and an event monitor. Echocardiogram 01/15/11 for mild LVH, EF 65%, moderate to severe LAE, moderate RAE. Myoview 01/18/11: No scar or ischemia. Study not gated due to a true fibrillation. I have reviewed the available strips from his monitor and these demonstrate atrial fibrillation without significant pauses or bradycardia.   Saw Dr Modesto Charon and posterior circulation MRI/MRA was fine. Symptoms have not recurred  Event monitor 2/13 with persistant afib. No pauses and reasonable rate control PVC no sustained VT  No complaints Still does not want novel agent   5/15  Cr and K normal labs reviewed   Willing to change to NOAC  INR 2.7 today   Lopressor dose changed to 12.5 bid due to headache and fatigue   Had trauma to left gluteal muscle 12/8  Reviewed CT and large intramuscular hematoma. Has been off aspirin and xarelto since     ROS: Denies fever, malais, weight loss, blurry vision, decreased visual acuity, cough, sputum, SOB, hemoptysis, pleuritic pain, palpitaitons, heartburn, abdominal pain, melena, lower extremity edema, claudication, or rash.  All other systems reviewed and negative  General: Affect  appropriate Healthy:  appears stated age HEENT: normal Neck supple with no adenopathy JVP normal no bruits no thyromegaly Lungs clear with no wheezing and good diaphragmatic motion Heart:  S1/S2 no murmur, no rub, gallop or click PMI normal Abdomen: benighn, BS positve, no tenderness, no AAA no bruit.  No HSM or HJR Distal pulses intact with no bruits No edema Neuro non-focal Skin warm and dry  Bruising on back of hands  No muscular weakness Still with left gluteal hematoma and mild bruising lower back and lateral aspect of left leg   Current Outpatient Prescriptions  Medication Sig Dispense Refill  . allopurinol (ZYLOPRIM) 300 MG tablet Take 0.5 tablets (150 mg total) by mouth daily. 45 tablet 3  . atorvastatin (LIPITOR) 20 MG tablet TAKE 1 TABLET BY MOUTH ON MONDAY, WEDNESDAY, FRIDAY, AND SUNDAY 51 tablet 3  . fluticasone (FLONASE) 50 MCG/ACT nasal spray Place 1 spray into the nose 2 (two) times daily. 1 g 11  . Fluticasone-Salmeterol (ADVAIR DISKUS) 250-50 MCG/DOSE AEPB INHALE 1 PUFF BY MOUTH EVERY 12 HOURS. 60 each 3  . lisinopril (PRINIVIL,ZESTRIL) 20 MG tablet Take 0.5 tablets (10 mg total) by mouth daily. 45 tablet 3  . metoprolol (LOPRESSOR) 50 MG tablet Take 12.5 mg by mouth 2 (two) times daily.    . Multiple Vitamin (MULTIVITAMIN) tablet Take 1 tablet by mouth daily.       No current facility-administered medications for this visit.    Allergies  Review of patient's allergies indicates no known allergies.  Electrocardiogram:  afib otherwise normal ECG 2014  2015  Afib rate 55 othewise normal   10/03/14  afib rate 44 no change  01/04/15 Afib rate 62 otherwise normal   Assessment and Plan Chronic afib: good rate control ECG confirms being in afib now.  Holding anticoagulation due to gluteal hematoma Hematoma:  Still with firmness and pain.  Will need to hold xarelto another few weeks.  Discussed risk/benefit regarding stroke prevention No asa  Will avoid muscle strain  to that area  Rebleed would increase risk of infection, need for surgery for evacuation and prolong stopping anticoagulaiton Even longer.  May start with 15 mg of Xarelto instead of normal dose after next office visit  HTN: Well controlled.  Continue current medications and low sodium Dash type diet.    Chol:   Lab Results  Component Value Date   LDLCALC 66 05/09/2014    COPD:  No active wheezing yearly CXR Advair     Charlton HawsPeter Chee Kinslow

## 2015-01-03 NOTE — Progress Notes (Signed)
   Subjective:    Patient ID: Reginald Green, male    DOB: 04/23/1945, 69 y.o.   MRN: [id number]  HPI    Review of Systems     Objective:   Physical Exam        Assessment & Plan:  Additional diagnosis is

## 2015-01-03 NOTE — Progress Notes (Signed)
   Subjective:    Patient ID: Reginald Green, male    DOB: 04/24/1945, 69 y.o.   MRN: [id number]  HPI    Review of Systems     Objective:   Physical Exam        Assessment & Plan:  Chronic Atrial Fibrillation suggested clinically by slow , irregular rhythm and rate (see exam ). Plan : as per Dr Eden EmmsNishan , Cardio;logy

## 2015-01-04 ENCOUNTER — Ambulatory Visit (INDEPENDENT_AMBULATORY_CARE_PROVIDER_SITE_OTHER): Payer: Medicare Other | Admitting: Cardiovascular Disease

## 2015-01-04 ENCOUNTER — Encounter: Payer: Self-pay | Admitting: Cardiovascular Disease

## 2015-01-04 VITALS — BP 112/68 | HR 70 | Ht 75.0 in | Wt 213.0 lb

## 2015-01-04 DIAGNOSIS — I482 Chronic atrial fibrillation, unspecified: Secondary | ICD-10-CM

## 2015-01-04 DIAGNOSIS — I10 Essential (primary) hypertension: Secondary | ICD-10-CM

## 2015-01-04 DIAGNOSIS — I4891 Unspecified atrial fibrillation: Secondary | ICD-10-CM | POA: Diagnosis not present

## 2015-01-04 NOTE — Patient Instructions (Addendum)
Medication Instructions:  Your physician recommends that you continue on your current medications as directed. Please refer to the Current Medication list given to you today.   Labwork: None ordered  Testing/Procedures: None ordered  Follow-Up: Your physician wants you to follow-up WITH DR. Fabio BeringNISHAN'S 1ST AVAILABLE.   Any Other Special Instructions Will Be Listed Below (If Applicable).   If you need a refill on your cardiac medications before your next appointment, please call your pharmacy.

## 2015-02-13 NOTE — Progress Notes (Signed)
Patient ID: Reginald Green, male   DOB: [birth year], 70 y.o.   MRN: [id number]   Reginald Green is a 70 y.o.  male who presents for follow up . He has a history of chronic atrial fibrillation on  anticoagulation, hypertension, COPD, gout, hyperlipidemia. He was seen in the emergency room 01/10/11 with chest discomfort and episodic dizziness. He also noted a spinning sensation at times. Cardiac markers were negative in the emergency room. Potassium was 3.9, creatinine 0.9, hemoglobin 14.6. Chest x-ray demonstrated chronically elevated left hemidiaphragm, borderline cardiac enlargement, pulmonary vascularity questionable for developing vascular congestion, no active consolidation. Patient was seen by Dr. Johney Frame. He felt the patient could be evaluated as an outpatient with an echocardiogram, myoview and an event monitor. Echocardiogram 01/15/11 for mild LVH, EF 65%, moderate to severe LAE, moderate RAE. Myoview 01/18/11: No scar or ischemia. Study not gated due to a true fibrillation. I have reviewed the available strips from his monitor and these demonstrate atrial fibrillation without significant pauses or bradycardia.   Saw Dr Modesto Charon and posterior circulation MRI/MRA was fine. Symptoms have not recurred  Event monitor 2/13 with persistant afib. No pauses and reasonable rate control PVC no sustained VT  No complaints Still does not want novel agent   5/15  Cr and K normal labs reviewed   Willing to change to NOAC  INR 2.7 today   Lopressor dose changed to 12.5 bid due to headache and fatigue   Had trauma to left gluteal muscle 12/8  Reviewed CT and large intramuscular hematoma. Has been off aspirin and xarelto since     ROS: Denies fever, malais, weight loss, blurry vision, decreased visual acuity, cough, sputum, SOB, hemoptysis, pleuritic pain, palpitaitons, heartburn, abdominal pain, melena, lower extremity edema, claudication, or rash.  All other systems reviewed and negative  General: Affect  appropriate Healthy:  appears stated age HEENT: normal Neck supple with no adenopathy JVP normal no bruits no thyromegaly Lungs clear with no wheezing and good diaphragmatic motion Heart:  S1/S2 no murmur, no rub, gallop or click PMI normal Abdomen: benighn, BS positve, no tenderness, no AAA no bruit.  No HSM or HJR Distal pulses intact with no bruits No edema Neuro non-focal Skin warm and dry  Bruising on back of hands  No muscular weakness Still with left gluteal hematoma and mild bruising lower back and lateral aspect of left leg   Current Outpatient Prescriptions  Medication Sig Dispense Refill  . allopurinol (ZYLOPRIM) 300 MG tablet Take 0.5 tablets (150 mg total) by mouth daily. 45 tablet 3  . atorvastatin (LIPITOR) 20 MG tablet TAKE 1 TABLET BY MOUTH ON MONDAY, WEDNESDAY, FRIDAY, AND SUNDAY 51 tablet 3  . fluticasone (FLONASE) 50 MCG/ACT nasal spray Place 1 spray into the nose 2 (two) times daily. 1 g 11  . Fluticasone-Salmeterol (ADVAIR DISKUS) 250-50 MCG/DOSE AEPB INHALE 1 PUFF BY MOUTH EVERY 12 HOURS. 60 each 3  . lisinopril (PRINIVIL,ZESTRIL) 20 MG tablet Take 0.5 tablets (10 mg total) by mouth daily. 45 tablet 3  . metoprolol (LOPRESSOR) 50 MG tablet Take 12.5 mg by mouth 2 (two) times daily.    . Multiple Vitamin (MULTIVITAMIN) tablet Take 1 tablet by mouth daily.      . rivaroxaban (XARELTO) 20 MG TABS tablet Take 1 tablet (20 mg total) by mouth daily with supper. 30 tablet 11   No current facility-administered medications for this visit.    Allergies  Review of patient's allergies indicates no known allergies.  Electrocardiogram:  afib otherwise normal ECG 2014  2015  Afib rate 55 othewise normal   10/03/14  afib rate 44 no change  01/04/15 Afib rate 62 otherwise normal   Assessment and Plan Chronic afib: good rate control ECG confirms being in afib now.  Restart 15 mg xarelto for 8 weeks then 20 mg  Hematoma:  Resolved related to trauma hopefully will not recur   No asa   HTN: Well controlled.  Continue current medications and low sodium Dash type diet.    Chol:   Lab Results  Component Value Date   LDLCALC 66 05/09/2014    COPD:  No active wheezing yearly CXR Advair     Charlton Haws

## 2015-02-14 ENCOUNTER — Ambulatory Visit (INDEPENDENT_AMBULATORY_CARE_PROVIDER_SITE_OTHER): Payer: Medicare Other | Admitting: Cardiovascular Disease

## 2015-02-14 ENCOUNTER — Encounter: Payer: Self-pay | Admitting: Cardiovascular Disease

## 2015-02-14 VITALS — BP 96/60 | HR 67 | Ht 75.0 in | Wt 218.1 lb

## 2015-02-14 DIAGNOSIS — I482 Chronic atrial fibrillation, unspecified: Secondary | ICD-10-CM

## 2015-02-14 DIAGNOSIS — Z79899 Other long term (current) drug therapy: Secondary | ICD-10-CM | POA: Diagnosis not present

## 2015-02-14 MED ORDER — RIVAROXABAN 20 MG PO TABS: 20.0000 mg | ORAL_TABLET | Freq: Every day | ORAL | Status: DC

## 2015-02-14 NOTE — Patient Instructions (Addendum)
Medication Instructions:  Your physician has recommended you make the following change in your medication:  1-START Xarelto 15 mg by mouth daily for 8 weeks, then take 20 mg by mouth daily  Labwork: NONE  Testing/Procedures: NONE  Follow-Up: Your physician wants you to follow-up in: 6 months with Dr. Eden Emms. You will receive a reminder letter in the mail two months in advance. If you don't receive a letter, please call our office to schedule the follow-up appointment.   If you need a refill on your cardiac medications before your next appointment, please call your pharmacy.

## 2015-02-15 ENCOUNTER — Encounter: Payer: Self-pay | Admitting: Cardiovascular Disease

## 2015-03-15 ENCOUNTER — Other Ambulatory Visit: Payer: Self-pay | Admitting: Internal Medicine

## 2015-03-28 ENCOUNTER — Ambulatory Visit (INDEPENDENT_AMBULATORY_CARE_PROVIDER_SITE_OTHER): Payer: Medicare Other | Admitting: Family

## 2015-03-28 ENCOUNTER — Encounter: Payer: Self-pay | Admitting: Family

## 2015-03-28 VITALS — BP 110/68 | HR 69 | Temp 97.7°F | Resp 16 | Ht 75.0 in | Wt 217.8 lb

## 2015-03-28 DIAGNOSIS — J449 Chronic obstructive pulmonary disease, unspecified: Secondary | ICD-10-CM

## 2015-03-28 DIAGNOSIS — Z8639 Personal history of other endocrine, nutritional and metabolic disease: Secondary | ICD-10-CM | POA: Diagnosis not present

## 2015-03-28 DIAGNOSIS — I10 Essential (primary) hypertension: Secondary | ICD-10-CM | POA: Diagnosis not present

## 2015-03-28 DIAGNOSIS — Z8739 Personal history of other diseases of the musculoskeletal system and connective tissue: Secondary | ICD-10-CM

## 2015-03-28 DIAGNOSIS — I482 Chronic atrial fibrillation, unspecified: Secondary | ICD-10-CM

## 2015-03-28 MED ORDER — FLUTICASONE-SALMETEROL 250-50 MCG/DOSE IN AEPB: INHALATION_SPRAY | RESPIRATORY_TRACT | Status: DC

## 2015-03-28 NOTE — Assessment & Plan Note (Signed)
Atrial fibrillation appears rate controlled and is well anticoagulated. No evidence of adverse effects or nuisance bleeding. Continue current dosage of metoprolol and Xarelto. Follow-up and changes per cardiology as needed.

## 2015-03-28 NOTE — Assessment & Plan Note (Signed)
COPD is stable with current dosage of Advair with no exacerbations. No shortness of breath or adverse side effects. Continue current dosage of Advair.

## 2015-03-28 NOTE — Assessment & Plan Note (Signed)
Hypertension is well-controlled and below goal 140/90 with current regimen. No adverse side effects. Continue to monitor blood pressure at home. Continue current dosage of metoprolol and lisinopril.

## 2015-03-28 NOTE — Progress Notes (Addendum)
Subjective:    Patient ID: Reginald Green, male    DOB: 1945-04-16, 70 y.o.   MRN: [id number]  Chief Complaint  Patient presents with  . Establish Care    Hypertension, Atrial Fibrillation, Gout    HPI:  Reginald Green is a 70 y.o. male who  has a past medical history of Hypertension; COPD (chronic obstructive pulmonary disease) (HCC); Gout; Chronic atrial fibrillation (HCC); Hyperlipidemia; and Gilbert's disease. and presents today for an office follow up.   1.) Atrial fibrillation - Anticoagulated with Xarelto. Reports taking the medication as prescribed with no adverse side effects or nuisance bleeding. Denies symptoms of chest pain, shortness of breath, or palpitations.   2.) Hypertension - Currently maintained on lisinopril and metoprolol. Reports taking the medication as prescribed and denies adverse side effects or cough.  BP Readings from Last 3 Encounters:  03/28/15 110/68  02/14/15 96/60  01/04/15 112/68    3.) Gout - Currently maintained on allopurinol. Reports taking the medication as prescribed and denies adverse side effects. Denies any recent gout attacks. When he does have a gout attack it generally occurs in his right great toe.   Lab Results  Component Value Date   LABURIC 6.4 05/09/2014    4.) COPD - Currently maintained on Advair and reports taking the medication as prescribed and denies adverse side effects. Notes that his symptoms are well controled with the current regimen.    No Known Allergies   Current Outpatient Prescriptions on File Prior to Visit  Medication Sig Dispense Refill  . allopurinol (ZYLOPRIM) 300 MG tablet Take 0.5 tablets (150 mg total) by mouth daily. 45 tablet 3  . atorvastatin (LIPITOR) 20 MG tablet TAKE 1 TABLET BY MOUTH ON MONDAY, WEDNESDAY, FRIDAY, AND SUNDAY 51 tablet 3  . fluticasone (FLONASE) 50 MCG/ACT nasal spray Place 1 spray into the nose 2 (two) times daily. 1 g 11  . lisinopril (PRINIVIL,ZESTRIL) 20 MG tablet Take  0.5 tablets (10 mg total) by mouth daily. 45 tablet 3  . metoprolol (LOPRESSOR) 50 MG tablet Take 12.5 mg by mouth 2 (two) times daily.    . Multiple Vitamin (MULTIVITAMIN) tablet Take 1 tablet by mouth daily.      . rivaroxaban (XARELTO) 20 MG TABS tablet Take 1 tablet (20 mg total) by mouth daily with supper. 30 tablet 11   No current facility-administered medications on file prior to visit.    Past Medical History  Diagnosis Date  . Hypertension   . COPD (chronic obstructive pulmonary disease) (HCC)   . Gout   . Chronic atrial fibrillation (HCC)     Multiple failed cardioversions (even on flecanide). Normal LV function by echo in 2009 with LA 45mm, reported nonischemic myoview at that time  . Hyperlipidemia   . Gilbert's disease     Past Surgical History  Procedure Laterality Date  . Hernia repair      Undescended testicle brought down at this time in 1979  . Appendectomy      At time of hernia repair in 1979  . Tonsillectomy    . Colonoscopy  2011    neg, [facility], Due 2021   . Flexible sigmoidoscopy  05/12/1997    Review of Systems  Constitutional: Negative for fever and chills.  Respiratory: Negative for chest tightness and shortness of breath.   Cardiovascular: Negative for chest pain, palpitations and leg swelling.  Musculoskeletal: Negative for arthralgias.  Neurological: Negative for weakness and numbness.  Hematological: Negative for adenopathy.  Does not bruise/bleed easily.      Objective:    BP 110/68 mmHg  Pulse 69  Temp(Src) 97.7 F (36.5 C) (Oral)  Resp 16  Ht 6\' 3"  (1.905 m)  Wt 217 lb 12.8 oz (98.793 kg)  BMI 27.22 kg/m2  SpO2 97% Nursing note and vital signs reviewed.  Physical Exam  Constitutional: He is oriented to person, place, and time. He appears well-developed and well-nourished. No distress.  Cardiovascular: Normal rate, normal heart sounds and intact distal pulses.  An irregularly irregular rhythm present.  Pulmonary/Chest: Effort  normal and breath sounds normal.  Neurological: He is alert and oriented to person, place, and time.  Skin: Skin is warm and dry.  Psychiatric: He has a normal mood and affect. His behavior is normal. Judgment and thought content normal.       Assessment & Plan:   Problem List Items Addressed This Visit      Cardiovascular and Mediastinum   Atrial fibrillation (HCC) - Primary    Atrial fibrillation appears rate controlled and is well anticoagulated. No evidence of adverse effects or nuisance bleeding. Continue current dosage of metoprolol and Xarelto. Follow-up and changes per cardiology as needed.      Hypertension    Hypertension is well-controlled and below goal 140/90 with current regimen. No adverse side effects. Continue to monitor blood pressure at home. Continue current dosage of metoprolol and lisinopril.        Respiratory   COPD (chronic obstructive pulmonary disease) (HCC)    COPD is stable with current dosage of Advair with no exacerbations. No shortness of breath or adverse side effects. Continue current dosage of Advair.      Relevant Medications   Fluticasone-Salmeterol (ADVAIR DISKUS) 250-50 MCG/DOSE AEPB     Other   History of gout    Clois ComberGallo adequately controlled with allopurinol with no recent flares. Most recent uric acid within normal limits. Continue current dosage of allopurinol.         I have changed Mr. Fodge's ADVAIR DISKUS to Fluticasone-Salmeterol. I am also having him maintain his multivitamin, fluticasone, lisinopril, atorvastatin, allopurinol, metoprolol, and rivaroxaban.

## 2015-03-28 NOTE — Assessment & Plan Note (Signed)
Clois ComberGallo adequately controlled with allopurinol with no recent flares. Most recent uric acid within normal limits. Continue current dosage of allopurinol.

## 2015-03-28 NOTE — Patient Instructions (Addendum)
Thank you for choosing [facility].  Summary/Instructions:  Please continue to take your medication as prescribed.   Follow up for a physical when due.

## 2015-03-28 NOTE — Progress Notes (Signed)
Pre visit review using our clinic review tool, if applicable. No additional management support is needed unless otherwise documented below in the visit note. 

## 2015-04-06 ENCOUNTER — Other Ambulatory Visit: Payer: Self-pay | Admitting: Internal Medicine

## 2015-06-15 ENCOUNTER — Telehealth: Payer: Self-pay | Admitting: Cardiovascular Disease

## 2015-06-15 NOTE — Telephone Encounter (Signed)
Pt has a "bone bruise" and has a knot on his shin-pt on xeralto -pls advise

## 2015-06-16 ENCOUNTER — Telehealth: Payer: Self-pay

## 2015-06-16 ENCOUNTER — Encounter: Payer: Self-pay | Admitting: Cardiovascular Disease

## 2015-06-16 NOTE — Telephone Encounter (Signed)
Ok to hold xarelto for 2-3 days to help hematoma resolve

## 2015-06-16 NOTE — Telephone Encounter (Signed)
I received this MyChart message from patient. "This is a follow-up to a telephone request to speak with Dr. Fabio BeringNishan's nurse on Thursday morning. I was told a message would be sent to his nurse but I have not received a reply. I received a bruise on my shin from a softball Wednesday night and it immediately swelled to a large knot. I have been applying ice packs since then and keeping it elevated and the knot has decreased in size but has turned purple. Should I limit walking and should I discontinue taking xarelto as I did in December when I had a hematoma in my buttocks?"   Called patient about his MyChart message. Patient stated that the knot has gone down to a golf ball size now. Patient states it does not hurt as much as it did. Informed patient that if this knot seems to be getting better to just keep an eye on it, but if it starts to cause pain or swells up again to go to urgent care to have it evaluated. Informed patient that this message would be sent to Dr. Eden EmmsNishan and if he has any further recommendation, he will advise.

## 2015-06-16 NOTE — Telephone Encounter (Signed)
Patient is on the list for [employer] 2017 and may be a good candidate for an AWV in 2017. Please let me know if/when appt is scheduled.   

## 2015-06-19 NOTE — Telephone Encounter (Signed)
Called patient back about Dr. Fabio BeringNishan's recommendations. Per Dr. Eden EmmsNishan okay to hold xarelto for 2-3 days to help hematoma resolve. Patient verbalized understanding and will call with any other concerns or questions.

## 2015-06-21 NOTE — Telephone Encounter (Signed)
Call to [name]; states he seen [name] in May and had all blood work completed at recent hospitalization of which he is still dealing with a hematoma but is getting better. Stated he decided to have CPE on schedule next year and feels he is good for now. No Preventive health overdue at present. Will defer AWV until CPE scheduled next year.  [name]; please advise if you would like to see him for fup later this year or other?

## 2015-06-21 NOTE — Telephone Encounter (Signed)
Follow up as needed is fine 

## 2015-07-12 ENCOUNTER — Other Ambulatory Visit: Payer: Self-pay | Admitting: *Deleted

## 2015-07-12 MED ORDER — FLUTICASONE-SALMETEROL 250-50 MCG/DOSE IN AEPB: INHALATION_SPRAY | RESPIRATORY_TRACT | Status: DC

## 2015-07-19 ENCOUNTER — Other Ambulatory Visit: Payer: Self-pay | Admitting: *Deleted

## 2015-07-19 MED ORDER — ATORVASTATIN CALCIUM 20 MG PO TABS: ORAL_TABLET | ORAL | Status: DC

## 2015-07-21 ENCOUNTER — Other Ambulatory Visit: Payer: Self-pay

## 2015-07-21 MED ORDER — FLUTICASONE PROPIONATE 50 MCG/ACT NA SUSP: 1.0000 | Freq: Two times a day (BID) | NASAL | Status: DC

## 2015-08-09 ENCOUNTER — Encounter: Payer: Self-pay | Admitting: Cardiovascular Disease

## 2015-08-17 ENCOUNTER — Other Ambulatory Visit: Payer: Self-pay | Admitting: Cardiovascular Disease

## 2015-08-24 NOTE — Progress Notes (Signed)
Patient ID: Reginald Green, male   DOB: 04/15/1945, 70 y.o.   MRN: [id number]   Reginald Green is a 70 y.o.  male who presents for follow up . He has a history of chronic atrial fibrillation on  anticoagulation, hypertension, COPD, gout, hyperlipidemia. He was seen in the emergency room 01/10/11 with chest discomfort and episodic dizziness. He also noted a spinning sensation at times. Cardiac markers were negative in the emergency room. Potassium was 3.9, creatinine 0.9, hemoglobin 14.6. Chest x-ray demonstrated chronically elevated left hemidiaphragm, borderline cardiac enlargement, pulmonary vascularity questionable for developing vascular congestion, no active consolidation. Patient was seen by Dr. Johney FrameAllred. He felt the patient could be evaluated as an outpatient with an echocardiogram, myoview and an event monitor. Echocardiogram 01/15/11 for mild LVH, EF 65%, moderate to severe LAE, moderate RAE. Myoview 01/18/11: No scar or ischemia. Study not gated due to a true fibrillation. I have reviewed the available strips from his monitor and these demonstrate atrial fibrillation without significant pauses or bradycardia.   Saw Dr Modesto Charonwong and posterior circulation MRI/MRA was fine. Symptoms have not recurred  Event monitor 2/13 with persistant afib. No pauses and reasonable rate control PVC no sustained VT  No complaints Still does not want novel agent   5/15  Cr and K normal labs reviewed   On NOAC for anticoagulaiton   Lopressor dose changed to 12.5 bid due to headache and fatigue   Had trauma to left gluteal muscle 12/8  Reviewed CT and large intramuscular hematoma. ASA/Xarelto held  ROS: Denies fever, malais, weight loss, blurry vision, decreased visual acuity, cough, sputum, SOB, hemoptysis, pleuritic pain, palpitaitons, heartburn, abdominal pain, melena, lower extremity edema, claudication, or rash.  All other systems reviewed and negative  General: Affect appropriate Healthy:  appears stated  age HEENT: normal Neck supple with no adenopathy JVP normal no bruits no thyromegaly Lungs clear with no wheezing and good diaphragmatic motion Heart:  S1/S2 no murmur, no rub, gallop or click PMI normal Abdomen: benighn, BS positve, no tenderness, no AAA no bruit.  No HSM or HJR Distal pulses intact with no bruits No edema Neuro non-focal Skin warm and dry  Bruising on back of hands  No muscular weakness Still with left gluteal hematoma and mild bruising lower back and lateral aspect of left leg   Current Outpatient Prescriptions  Medication Sig Dispense Refill  . allopurinol (ZYLOPRIM) 300 MG tablet Take 300 mg by mouth daily. Pt takes 150 mg daily by mouth    . atorvastatin (LIPITOR) 20 MG tablet TAKE 1 TABLET BY MOUTH ON MONDAY, WEDNESDAY, FRIDAY, AND SUNDAY 51 tablet 1  . fluticasone (FLONASE) 50 MCG/ACT nasal spray Place 1 spray into both nostrils 2 (two) times daily. 1 g 3  . Fluticasone-Salmeterol (ADVAIR DISKUS) 250-50 MCG/DOSE AEPB INHALE 1 PUFF BY MOUTH EVERY 12 HOURS. 60 each 5  . lisinopril (PRINIVIL,ZESTRIL) 20 MG tablet TAKE 1/2 TABLET BY MOUTH DAILY 45 tablet 3  . metoprolol (LOPRESSOR) 50 MG tablet Take 12.5 mg by mouth 2 (two) times daily.    . Multiple Vitamin (MULTIVITAMIN) tablet Take 1 tablet by mouth daily.      . rivaroxaban (XARELTO) 20 MG TABS tablet Take 1 tablet (20 mg total) by mouth daily with supper. 30 tablet 11   No current facility-administered medications for this visit.     Allergies  Review of patient's allergies indicates no known allergies.  Electrocardiogram:  afib otherwise normal ECG 2014  2015  Afib rate 55  othewise normal   10/03/14  afib rate 44 no change  01/04/15 Afib rate 62 otherwise normal   Assessment and Plan Chronic afib: good rate control On Xarelto 20  mg  Will get labs with primary in January   Hematoma:  Resolved related to trauma hopefully will not recur  No asa   HTN: Well controlled.  Continue current medications  and low sodium Dash type diet.    Chol:   Lab Results  Component Value Date   LDLCALC 66 05/09/2014    COPD:  No active wheezing yearly CXR Advair    F/U with me in 6 months   Reginald Green

## 2015-08-25 ENCOUNTER — Encounter: Payer: Self-pay | Admitting: Cardiovascular Disease

## 2015-08-25 ENCOUNTER — Ambulatory Visit (INDEPENDENT_AMBULATORY_CARE_PROVIDER_SITE_OTHER): Payer: Medicare Other | Admitting: Cardiovascular Disease

## 2015-08-25 VITALS — BP 94/64 | HR 81 | Ht 75.0 in | Wt 210.0 lb

## 2015-08-25 DIAGNOSIS — I482 Chronic atrial fibrillation, unspecified: Secondary | ICD-10-CM

## 2015-08-25 NOTE — Patient Instructions (Signed)

## 2015-10-02 ENCOUNTER — Other Ambulatory Visit: Payer: Self-pay | Admitting: Cardiovascular Disease

## 2015-10-02 MED ORDER — METOPROLOL TARTRATE 25 MG PO TABS: 12.5000 mg | ORAL_TABLET | Freq: Two times a day (BID) | ORAL | 3 refills | Status: DC

## 2015-10-06 ENCOUNTER — Encounter: Payer: Self-pay | Admitting: Family

## 2015-10-06 ENCOUNTER — Other Ambulatory Visit: Payer: Self-pay | Admitting: Family

## 2015-10-06 ENCOUNTER — Encounter: Payer: Self-pay | Admitting: Cardiovascular Disease

## 2015-10-06 MED ORDER — ALLOPURINOL 300 MG PO TABS: 150.0000 mg | ORAL_TABLET | Freq: Every day | ORAL | 1 refills | Status: DC

## 2015-10-06 NOTE — Telephone Encounter (Signed)
Called patient to verify the dose patient has been taking before he was switched to the smaller dose Metoprolol 25 mg tablet. Patient stated he has been quartering his Metoprolol 50 mg to make 12.5 mg. Patient thought he had received Metoprolol 50 mg tablets, but he was mistaken. Patient received Metoprolol 25 mg tablet, which he will cut in half to make 12.5 mg dose. Patient prescription is Metoprolol 12.5 mg by mouth twice daily. Patient verbalized understanding.

## 2015-10-17 ENCOUNTER — Other Ambulatory Visit: Payer: Self-pay | Admitting: Cardiovascular Disease

## 2015-10-20 ENCOUNTER — Encounter: Payer: Self-pay | Admitting: Family

## 2015-11-15 ENCOUNTER — Other Ambulatory Visit: Payer: Self-pay | Admitting: Family

## 2015-12-06 ENCOUNTER — Other Ambulatory Visit: Payer: Self-pay | Admitting: Family

## 2016-01-07 ENCOUNTER — Other Ambulatory Visit: Payer: Self-pay | Admitting: Family

## 2016-01-14 ENCOUNTER — Other Ambulatory Visit: Payer: Self-pay | Admitting: Family

## 2016-01-17 ENCOUNTER — Encounter: Payer: Self-pay | Admitting: Family

## 2016-01-17 ENCOUNTER — Ambulatory Visit (INDEPENDENT_AMBULATORY_CARE_PROVIDER_SITE_OTHER): Payer: Medicare Other | Admitting: Family

## 2016-01-17 ENCOUNTER — Other Ambulatory Visit (INDEPENDENT_AMBULATORY_CARE_PROVIDER_SITE_OTHER): Payer: Medicare Other

## 2016-01-17 VITALS — BP 110/74 | HR 75 | Temp 98.0°F | Resp 16 | Ht 75.0 in | Wt 216.0 lb

## 2016-01-17 DIAGNOSIS — I482 Chronic atrial fibrillation, unspecified: Secondary | ICD-10-CM

## 2016-01-17 DIAGNOSIS — E782 Mixed hyperlipidemia: Secondary | ICD-10-CM

## 2016-01-17 DIAGNOSIS — Z125 Encounter for screening for malignant neoplasm of prostate: Secondary | ICD-10-CM | POA: Diagnosis not present

## 2016-01-17 DIAGNOSIS — Z0000 Encounter for general adult medical examination without abnormal findings: Secondary | ICD-10-CM | POA: Insufficient documentation

## 2016-01-17 DIAGNOSIS — J449 Chronic obstructive pulmonary disease, unspecified: Secondary | ICD-10-CM

## 2016-01-17 DIAGNOSIS — I10 Essential (primary) hypertension: Secondary | ICD-10-CM

## 2016-01-17 LAB — CBC
HCT: 43.1 % (ref 39.0–52.0)
HEMOGLOBIN: 15 g/dL (ref 13.0–17.0)
MCHC: 34.9 g/dL (ref 30.0–36.0)
MCV: 99 fl (ref 78.0–100.0)
Platelets: 249 10*3/uL (ref 150.0–400.0)
RBC: 4.35 Mil/uL (ref 4.22–5.81)
RDW: 12.5 % (ref 11.5–15.5)
WBC: 8.2 10*3/uL (ref 4.0–10.5)

## 2016-01-17 LAB — LIPID PANEL
CHOL/HDL RATIO: 2
Cholesterol: 140 mg/dL (ref 0–200)
HDL: 67.3 mg/dL (ref >39.00)
LDL Cholesterol: 62 mg/dL (ref 0–99)
NONHDL: 72.3
Triglycerides: 53 mg/dL (ref 0.0–149.0)
VLDL: 10.6 mg/dL (ref 0.0–40.0)

## 2016-01-17 LAB — COMPREHENSIVE METABOLIC PANEL
ALT: 19 U/L (ref 0–53)
AST: 23 U/L (ref 0–37)
Albumin: 4 g/dL (ref 3.5–5.2)
Alkaline Phosphatase: 58 U/L (ref 39–117)
BILIRUBIN TOTAL: 1.2 mg/dL (ref 0.2–1.2)
BUN: 16 mg/dL (ref 6–23)
CO2: 27 mEq/L (ref 19–32)
Calcium: 9.5 mg/dL (ref 8.4–10.5)
Chloride: 104 mEq/L (ref 96–112)
Creatinine, Ser: 1.02 mg/dL (ref 0.40–1.50)
GFR: 77 mL/min (ref >60.00)
GLUCOSE: 100 mg/dL — AB (ref 70–99)
POTASSIUM: 4.8 meq/L (ref 3.5–5.1)
SODIUM: 137 meq/L (ref 135–145)
TOTAL PROTEIN: 6.8 g/dL (ref 6.0–8.3)

## 2016-01-17 LAB — PSA: PSA: 0.3 ng/mL (ref 0.10–4.00)

## 2016-01-17 NOTE — Progress Notes (Signed)
Subjective:    Patient ID: Reginald Green, male    DOB: 06-06-1945, 71 y.o.   MRN: [id number]  Chief Complaint  Patient presents with  . Medicare Wellness    fasting    HPI:  Reginald Green is a 71 y.o. male who presents today for a Medicare Annual Wellness/Physical exam.    1) Health Maintenance -   Diet - Averaging about 3 meals per day consisting of regular diet; Caffeine intake 2-3 cups daily  Exercise - 3x per week walking about 2-3 miles   2) Preventative Exams / Immunizations:  Dental -- Up to date  Vision -- Up to date   Health Maintenance  Topic Date Due  . COLONOSCOPY  02/16/2019  . TETANUS/TDAP  01/27/2020  . INFLUENZA VACCINE  Completed  . ZOSTAVAX  Completed  . Hepatitis C Screening  Completed  . PNA vac Low Risk Adult  Completed     Immunization History  Administered Date(s) Administered  . Influenza, High Dose Seasonal PF 10/15/2013  . Influenza, Seasonal, Injecte, Preservative Fre 09/24/2012  . Influenza-Unspecified 10/17/2014, 10/20/2015  . Pneumococcal Conjugate-13 05/09/2014  . Pneumococcal Polysaccharide-23 05/05/2012  . Td 01/26/2010  . Zoster 06/25/2012    RISK FACTORS  Tobacco History  Smoking Status  . Former Smoker  . Packs/day: 2.00  . Years: 22.00  . Types: Cigarettes  . Quit date: 02/01/1987  Smokeless Tobacco  . Never Used    Comment: smoked [phone] , up to 2 ppd     Cardiac risk factors: advanced age (older than 49 for men, 12 for women), dyslipidemia, hypertension and male gender.  Depression Screen  Depression screen Reginald Green 2/9 01/17/2016  Decreased Interest 0  Down, Depressed, Hopeless 0  PHQ - 2 Score 0     Activities of Daily Living In your present state of health, do you have any difficulty performing the following activities?:  Driving? No Managing money?  No Feeding yourself? No Getting from bed to chair? No Climbing a flight of stairs? No Preparing food and eating?: No Bathing or showering?  No Getting dressed: No Getting to the toilet? No Using the toilet: No Moving around from place to place: No In the past year have you fallen or had a near fall?:No   Home Safety Has smoke detector and wears seat belts. No excess sun exposure. Are there smokers in your home (other than you)?  No Do you feel safe at home?  Yes  Hearing Difficulties: No Do you often ask people to speak up or repeat themselves? No Do you experience ringing or noises in your ears? No  Do you have difficulty understanding soft or whispered voices? No    Cognitive Testing  Alert? Yes   Normal Appearance? Yes  Oriented to person? Yes  Place? Yes   Time? Yes  Recall of three objects?  Yes  Can perform simple calculations? Yes  Displays appropriate judgment? Yes  Can read the correct time from a watch face? Yes  Do you feel that you have a problem with memory? No  Do you often misplace items? No   Advanced Directives have been discussed with the patient? Yes   Current Physicians/Providers and Suppliers  1. Marcos Eke, FNP - Internal Medicine 2. Charlton Haws, MD - Cardiologyt  Indicate any recent Medical Services you may have received from other than [facility] providers in the past year (date may be approximate).  All answers were reviewed with the patient and necessary referrals were  made:  Jeanine Luz, FNP   01/17/2016    No Known Allergies   Outpatient Medications Prior to Visit  Medication Sig Dispense Refill  . atorvastatin (LIPITOR) 20 MG tablet TAKE ONE TABLET BY MOUTH ON MONDAY, WEDNESDAY, FRIDAY AND SUNDAY. 51 tablet 0  . atorvastatin (LIPITOR) 20 MG tablet TAKE ONE TABLET BY MOUTH ON MONDAY, WEDNESDAY, FRIDAY AND SUNDAY. 51 tablet 0  . fluticasone (FLONASE) 50 MCG/ACT nasal spray PLACE 1 SPRAY INTO BOTH NOSTRILS TWICE DAILY 16 g 2  . Fluticasone-Salmeterol (ADVAIR DISKUS) 250-50 MCG/DOSE AEPB INHALE 1 PUFF BY MOUTH EVERY 12 HOURS. 60 each 5  . lisinopril (PRINIVIL,ZESTRIL) 20 MG  tablet TAKE 1/2 TABLET BY MOUTH DAILY 45 tablet 3  . Multiple Vitamin (MULTIVITAMIN) tablet Take 1 tablet by mouth daily.      . rivaroxaban (XARELTO) 20 MG TABS tablet Take 1 tablet (20 mg total) by mouth daily with supper. 30 tablet 11  . allopurinol (ZYLOPRIM) 300 MG tablet TAKE 1 TABLET (300 MG TOTAL) BY MOUTH DAILY. 45 tablet 1  . metoprolol tartrate (LOPRESSOR) 25 MG tablet Take 0.5 tablets (12.5 mg total) by mouth 2 (two) times daily. 90 tablet 3   No facility-administered medications prior to visit.      Past Medical History:  Diagnosis Date  . Chronic atrial fibrillation (HCC)    Multiple failed cardioversions (even on flecanide). Normal LV function by echo in 2009 with LA 45mm, reported nonischemic myoview at that time  . COPD (chronic obstructive pulmonary disease) (HCC)   . Gilbert's disease   . Gout   . Hyperlipidemia   . Hypertension      Past Surgical History:  Procedure Laterality Date  . APPENDECTOMY     At time of hernia repair in 1979  . COLONOSCOPY  2011   neg, [facility], Due 2021   . FLEXIBLE SIGMOIDOSCOPY  05/12/1997  . HERNIA REPAIR     Undescended testicle brought down at this time in 1979  . TONSILLECTOMY       Family History  Problem Relation Age of Onset  . Stroke Mother 52  . Hypertension Mother   . Heart attack Father 91  . Heart attack Maternal Uncle 55  . Diabetes Maternal Grandmother   . Asthma Neg Hx   . COPD Neg Hx      Social History   Social History  . Marital status: Married    Spouse name: N/A  . Number of children: 3  . Years of education: 12   Occupational History  . Not on file.   Social History Main Topics  . Smoking status: Former Smoker    Packs/day: 2.00    Years: 22.00    Types: Cigarettes    Quit date: 02/01/1987  . Smokeless tobacco: Never Used     Comment: smoked 1967-1989 , up to 2 ppd  . Alcohol use 8.4 oz/week    14 Cans of beer per week  . Drug use: No  . Sexual activity: No   Other Topics  Concern  . Not on file   Social History Narrative   Fun: Work out in the yard, chase [employer].      Review of Systems  Constitutional: Denies fever, chills, fatigue, or significant weight gain/loss. HENT: Head: Denies headache or neck pain Ears: Denies changes in hearing, ringing in ears, earache, drainage Nose: Denies discharge, stuffiness, itching, nosebleed, sinus pain Throat: Denies sore throat, hoarseness, dry mouth, sores, thrush Eyes: Denies loss/changes in vision, pain,  redness, blurry/double vision, flashing lights Cardiovascular: Denies chest pain/discomfort, tightness, palpitations, shortness of breath with activity, difficulty lying down, swelling, sudden awakening with shortness of breath Respiratory: Denies shortness of breath, cough, sputum production, wheezing Gastrointestinal: Denies dysphasia, heartburn, change in appetite, nausea, change in bowel habits, rectal bleeding, constipation, diarrhea, yellow skin or eyes Genitourinary: Denies frequency, urgency, burning/pain, blood in urine, incontinence, change in urinary strength. Musculoskeletal: Denies muscle/joint pain, stiffness, back pain, redness or swelling of joints, trauma Skin: Denies rashes, lumps, itching, dryness, color changes, or hair/nail changes Neurological: Denies dizziness, fainting, seizures, weakness, numbness, tingling, tremor Psychiatric - Denies nervousness, stress, depression or memory loss Endocrine: Denies heat or cold intolerance, sweating, frequent urination, excessive thirst, changes in appetite Hematologic: Denies ease of bruising or bleeding    Objective:     BP 110/74 (BP Location: Left Arm, Patient Position: Sitting, Cuff Size: Large)   Pulse 75   Temp 98 F (36.7 C) (Oral)   Resp 16   Ht 6\' 3"  (1.905 m)   Wt 216 lb (98 kg)   SpO2 98%   BMI 27.00 kg/m  Nursing note and vital signs reviewed.  Physical Exam  Constitutional: He is oriented to person, place, and time. He  appears well-developed and well-nourished.  HENT:  Head: Normocephalic.  Right Ear: Hearing, tympanic membrane, external ear and ear canal normal.  Left Ear: Hearing, tympanic membrane, external ear and ear canal normal.  Nose: Nose normal.  Mouth/Throat: Uvula is midline, oropharynx is clear and moist and mucous membranes are normal.  Eyes: Conjunctivae and EOM are normal. Pupils are equal, round, and reactive to light.  Neck: Neck supple. No JVD present. No tracheal deviation present. No thyromegaly present.  Cardiovascular: Normal rate, regular rhythm, normal heart sounds and intact distal pulses.   Pulmonary/Chest: Effort normal and breath sounds normal.  Abdominal: Soft. Bowel sounds are normal. He exhibits no distension and no mass. There is no tenderness. There is no rebound and no guarding.  Musculoskeletal: Normal range of motion. He exhibits no edema or tenderness.  Lymphadenopathy:    He has no cervical adenopathy.  Neurological: He is alert and oriented to person, place, and time. He has normal reflexes. No cranial nerve deficit. He exhibits normal muscle tone. Coordination normal.  Skin: Skin is warm and dry.  Psychiatric: He has a normal mood and affect. His behavior is normal. Judgment and thought content normal.       Assessment & Plan:   During the course of the visit the patient was educated and counseled about appropriate screening and preventive services including:    Pneumococcal vaccine   Influenza vaccine  Prostate cancer screening  Colorectal cancer screening  Glaucoma screening  Nutrition counseling   Diet review for nutrition referral? Yes ____  Not Indicated _X___   Patient Instructions (the written plan) was given to the patient.  Medicare Attestation I have personally reviewed: The patient's medical and social history Their use of alcohol, tobacco or illicit drugs Their current medications and supplements The patient's functional ability  including ADLs,fall risks, home safety risks, cognitive, and hearing and visual impairment Diet and physical activities Evidence for depression or mood disorders  The patient's weight, height, BMI,  have been recorded in the chart.  I have made referrals, counseling, and provided education to the patient based on review of the above and I have provided the patient with a written personalized care plan for preventive services.     Problem List Items Addressed This  Visit      Cardiovascular and Mediastinum   Atrial fibrillation [facility])    Atrial fibrillation appears stable and noted upon exam today. Anticoagulated with Xarelto. No adverse side effects or nuisance bleeding. Continue current dosage of Xarelto and metoprolol. Follow-up and changes per cardiology.      Hypertension    Hypertension well-controlled and below goal 140/90 with current medication regimen and no adverse side effects. Denies worst headache of life with no new symptoms of end organ damage noted in physical exam. Continue to monitor blood pressure and follow low-sodium diet.        Respiratory   COPD (chronic obstructive pulmonary disease) (HCC)    COPD appears stable with current medication regimen and no adverse side effects or exacerbations. Continue current dosage of Advair. Continue to monitor.        Other   Hyperlipidemia    Appears stable with current medication regimen and no adverse side effects or myalgias. Obtain lipid profile. Continue current dosage of atorvastatin pending lipid profile results.      Routine general medical examination at a health care facility    1) Anticipatory Guidance: Discussed importance of wearing a seatbelt while driving and not texting while driving; changing batteries in smoke detector at least once annually; wearing suntan lotion when outside; eating a balanced and moderate diet; getting physical activity at least 30 minutes per day.  2) Immunizations / Screenings / Labs:   All immunizations are up-to-date per recommendations. Obtain PSA for prostate cancer screening. All other screenings are up-to-date per recommendations. Obtain CBC, CMET, and lipid profile.    Overall well exam with risk factors for cardiovascular disease including hyperlipidemia, hypertension, and atrial fibrillation. Chronic conditions appear adequately controlled with medication regimen and no adverse side effects. He exercises regularly and eats a regular diet. His goal for this years to be slightly more active. Continue healthy lifestyle behaviors and choices. Follow-up prevention exam in 1 year. Follow-up office visit pending blood work as necessary.       Relevant Orders   CBC (Completed)   Comprehensive metabolic panel (Completed)   Lipid panel (Completed)   PSA (Completed)   Medicare annual wellness visit, subsequent - Primary    Reviewed and updated patient's medical, surgical, family and social history. Medications and allergies were also reviewed. Basic screenings for depression, activities of daily living, hearing, cognition and safety were performed. Provider list was updated and health plan was provided to the patient.           I am having Mr. Bailon maintain his multivitamin, rivaroxaban, Fluticasone-Salmeterol, lisinopril, metoprolol tartrate, fluticasone, atorvastatin, atorvastatin, and allopurinol.   Meds ordered this encounter  Medications  . allopurinol (ZYLOPRIM) 150 mg TABS tablet    Sig: Take 150 mg by mouth daily.     Follow-up: No Follow-up on file.   Jeanine Luz, FNP

## 2016-01-17 NOTE — Assessment & Plan Note (Signed)
Atrial fibrillation appears stable and noted upon exam today. Anticoagulated with Xarelto. No adverse side effects or nuisance bleeding. Continue current dosage of Xarelto and metoprolol. Follow-up and changes per cardiology.

## 2016-01-17 NOTE — Assessment & Plan Note (Signed)
COPD appears stable with current medication regimen and no adverse side effects or exacerbations. Continue current dosage of Advair. Continue to monitor.

## 2016-01-17 NOTE — Assessment & Plan Note (Signed)
Reviewed and updated patient's medical, surgical, family and social history. Medications and allergies were also reviewed. Basic screenings for depression, activities of daily living, hearing, cognition and safety were performed. Provider list was updated and health plan was provided to the patient.  

## 2016-01-17 NOTE — Assessment & Plan Note (Signed)
Appears stable with current medication regimen and no adverse side effects or myalgias. Obtain lipid profile. Continue current dosage of atorvastatin pending lipid profile results.

## 2016-01-17 NOTE — Assessment & Plan Note (Signed)
Hypertension well-controlled and below goal 140/90 with current medication regimen and no adverse side effects. Denies worst headache of life with no new symptoms of end organ damage noted in physical exam. Continue to monitor blood pressure and follow low-sodium diet.

## 2016-01-17 NOTE — Assessment & Plan Note (Signed)
1) Anticipatory Guidance: Discussed importance of wearing a seatbelt while driving and not texting while driving; changing batteries in smoke detector at least once annually; wearing suntan lotion when outside; eating a balanced and moderate diet; getting physical activity at least 30 minutes per day.  2) Immunizations / Screenings / Labs:  All immunizations are up-to-date per recommendations. Obtain PSA for prostate cancer screening. All other screenings are up-to-date per recommendations. Obtain CBC, CMET, and lipid profile.    Overall well exam with risk factors for cardiovascular disease including hyperlipidemia, hypertension, and atrial fibrillation. Chronic conditions appear adequately controlled with medication regimen and no adverse side effects. He exercises regularly and eats a regular diet. His goal for this years to be slightly more active. Continue healthy lifestyle behaviors and choices. Follow-up prevention exam in 1 year. Follow-up office visit pending blood work as necessary.

## 2016-01-17 NOTE — Patient Instructions (Addendum)
Thank you for choosing [facility].  SUMMARY AND INSTRUCTIONS:  Medication:  Keep taking your medications as prescribed.   Labs:  Please stop by the lab on the lower level of the building for your blood work. Your results will be released to MyChart (or called to you) after review, usually within 72 hours after test completion. If any changes need to be made, you will be notified at that same time.  1.) The lab is open from 7:30am to 5:30 pm Monday-Friday 2.) No appointment is necessary 3.) Fasting (if needed) is 6-8 hours after food and drink; black coffee and water are okay    Follow up:  If your symptoms worsen or fail to improve, please contact our office for further instruction, or in case of emergency go directly to the emergency room at the closest medical facility.    Health Maintenance  Topic Date Due  . COLONOSCOPY  02/16/2019  . TETANUS/TDAP  01/27/2020  . INFLUENZA VACCINE  Completed  . ZOSTAVAX  Completed  . Hepatitis C Screening  Completed  . PNA vac Low Risk Adult  Completed    Health Maintenance, Male A healthy lifestyle and preventative care can promote health and wellness.  Maintain regular health, dental, and eye exams.  Eat a healthy diet. Foods like vegetables, fruits, whole grains, low-fat dairy products, and lean protein foods contain the nutrients you need and are low in calories. Decrease your intake of foods high in solid fats, added sugars, and salt. Get information about a proper diet from your health care provider, if necessary.  Regular physical exercise is one of the most important things you can do for your health. Most adults should get at least 150 minutes of moderate-intensity exercise (any activity that increases your heart rate and causes you to sweat) each week. In addition, most adults need muscle-strengthening exercises on 2 or more days a week.   Maintain a healthy weight. The body mass index (BMI) is a screening tool to  identify possible weight problems. It provides an estimate of body fat based on height and weight. Your health care provider can find your BMI and can help you achieve or maintain a healthy weight. For males 20 years and older:  A BMI below 18.5 is considered underweight.  A BMI of 18.5 to 24.9 is normal.  A BMI of 25 to 29.9 is considered overweight.  A BMI of 30 and above is considered obese.  Maintain normal blood lipids and cholesterol by exercising and minimizing your intake of saturated fat. Eat a balanced diet with plenty of fruits and vegetables. Blood tests for lipids and cholesterol should begin at age 40 and be repeated every 5 years. If your lipid or cholesterol levels are high, you are over age 86, or you are at high risk for heart disease, you may need your cholesterol levels checked more frequently.Ongoing high lipid and cholesterol levels should be treated with medicines if diet and exercise are not working.  If you smoke, find out from your health care provider how to quit. If you do not use tobacco, do not start.  Lung cancer screening is recommended for adults aged 55-80 years who are at high risk for developing lung cancer because of a history of smoking. A yearly low-dose CT scan of the lungs is recommended for people who have at least a 30-pack-year history of smoking and are current smokers or have quit within the past 15 years. A pack year of smoking is smoking  an average of 1 pack of cigarettes a day for 1 year (for example, a 30-pack-year history of smoking could mean smoking 1 pack a day for 30 years or 2 packs a day for 15 years). Yearly screening should continue until the smoker has stopped smoking for at least 15 years. Yearly screening should be stopped for people who develop a health problem that would prevent them from having lung cancer treatment.  If you choose to drink alcohol, do not have more than 2 drinks per day. One drink is considered to be 12 oz (360 mL) of  beer, 5 oz (150 mL) of wine, or 1.5 oz (45 mL) of liquor.  Avoid the use of street drugs. Do not share needles with anyone. Ask for help if you need support or instructions about stopping the use of drugs.  High blood pressure causes heart disease and increases the risk of stroke. High blood pressure is more likely to develop in:  People who have blood pressure in the end of the normal range (100-139/85-89 mm Hg).  People who are overweight or obese.  People who are African American.  If you are 68-1 years of age, have your blood pressure checked every 3-5 years. If you are 81 years of age or older, have your blood pressure checked every year. You should have your blood pressure measured twice-once when you are at a hospital or clinic, and once when you are not at a hospital or clinic. Record the average of the two measurements. To check your blood pressure when you are not at a hospital or clinic, you can use:  An automated blood pressure machine at a pharmacy.  A home blood pressure monitor.  If you are 67-38 years old, ask your health care provider if you should take aspirin to prevent heart disease.  Diabetes screening involves taking a blood sample to check your fasting blood sugar level. This should be done once every 3 years after age 80 if you are at a normal weight and without risk factors for diabetes. Testing should be considered at a younger age or be carried out more frequently if you are overweight and have at least 1 risk factor for diabetes.  Colorectal cancer can be detected and often prevented. Most routine colorectal cancer screening begins at the age of 36 and continues through age 86. However, your health care provider may recommend screening at an earlier age if you have risk factors for colon cancer. On a yearly basis, your health care provider may provide home test kits to check for hidden blood in the stool. A small camera at the end of a tube may be used to directly  examine the colon (sigmoidoscopy or colonoscopy) to detect the earliest forms of colorectal cancer. Talk to your health care provider about this at age 23 when routine screening begins. A direct exam of the colon should be repeated every 5-10 years through age 65, unless early forms of precancerous polyps or small growths are found.  People who are at an increased risk for hepatitis B should be screened for this virus. You are considered at high risk for hepatitis B if:  You were born in a country where hepatitis B occurs often. Talk with your health care provider about which countries are considered high risk.  Your parents were born in a high-risk country and you have not received a shot to protect against hepatitis B (hepatitis B vaccine).  You have HIV or AIDS.  You use  needles to inject street drugs.  You live with, or have sex with, someone who has hepatitis B.  You are a man who has sex with other men (MSM).  You get hemodialysis treatment.  You take certain medicines for conditions like cancer, organ transplantation, and autoimmune conditions.  Hepatitis C blood testing is recommended for all people born from [id number] through 1965 and any individual with known risk factors for hepatitis C.  Healthy men should no longer receive prostate-specific antigen (PSA) blood tests as part of routine cancer screening. Talk to your health care provider about prostate cancer screening.  Testicular cancer screening is not recommended for adolescents or adult males who have no symptoms. Screening includes self-exam, a health care provider exam, and other screening tests. Consult with your health care provider about any symptoms you have or any concerns you have about testicular cancer.  Practice safe sex. Use condoms and avoid high-risk sexual practices to reduce the spread of sexually transmitted infections (STIs).  You should be screened for STIs, including gonorrhea and chlamydia if:  You are  sexually active and are younger than 24 years.  You are older than 24 years, and your health care provider tells you that you are at risk for this type of infection.  Your sexual activity has changed since you were last screened, and you are at an increased risk for chlamydia or gonorrhea. Ask your health care provider if you are at risk.  If you are at risk of being infected with HIV, it is recommended that you take a prescription medicine daily to prevent HIV infection. This is called pre-exposure prophylaxis (PrEP). You are considered at risk if:  You are a man who has sex with other men (MSM).  You are a heterosexual man who is sexually active with multiple partners.  You take drugs by injection.  You are sexually active with a partner who has HIV.  Talk with your health care provider about whether you are at high risk of being infected with HIV. If you choose to begin PrEP, you should first be tested for HIV. You should then be tested every 3 months for as long as you are taking PrEP.  Use sunscreen. Apply sunscreen liberally and repeatedly throughout the day. You should seek shade when your shadow is shorter than you. Protect yourself by wearing long sleeves, pants, a wide-brimmed hat, and sunglasses year round whenever you are outdoors.  Tell your health care provider of new moles or changes in moles, especially if there is a change in shape or color. Also, tell your health care provider if a mole is larger than the size of a pencil eraser.  A one-time screening for abdominal aortic aneurysm (AAA) and surgical repair of large AAAs by ultrasound is recommended for men aged 65-75 years who are current or former smokers.  Stay current with your vaccines (immunizations). This information is not intended to replace advice given to you by your health care provider. Make sure you discuss any questions you have with your health care provider. Document Released: 06/22/2007 Document Revised:  01/14/2014 Document Reviewed: 09/27/2014 Elsevier Interactive Patient Education  2017 [employer]

## 2016-02-21 ENCOUNTER — Other Ambulatory Visit: Payer: Self-pay | Admitting: Cardiovascular Disease

## 2016-02-23 NOTE — Progress Notes (Deleted)
Patient ID: Reginald Green, male   DOB: 08/15/1945, 71 y.o.   MRN: [id number]   Reginald Green is a 71 y.o.  male who presents for follow up . He has a history of chronic atrial fibrillation on  NOAC  hypertension, COPD, gout, hyperlipidemia. He was seen in the emergency room 01/10/11 with chest discomfort and episodic dizziness. He also noted a spinning sensation at times. Cardiac markers were negative in the emergency room. Potassium was 3.9, creatinine 0.9, hemoglobin 14.6. Chest x-ray demonstrated chronically elevated left hemidiaphragm, borderline cardiac enlargement, pulmonary vascularity questionable for developing vascular congestion, no active consolidation. Patient was seen by Dr. Johney Frame. He felt the patient could be evaluated as an outpatient with an echocardiogram, myoview and an event monitor. Echocardiogram 01/15/11 for mild LVH, EF 65%, moderate to severe LAE, moderate RAE. Myoview 01/18/11: No scar or ischemia. Study not gated due to a true fibrillation. I have reviewed the available strips from his monitor and these demonstrate atrial fibrillation without significant pauses or bradycardia.   Saw Dr Modesto Charon and posterior circulation MRI/MRA was fine. Symptoms have not recurred  Event monitor 2/13 with persistant afib. No pauses and reasonable rate control PVC no sustained VT  No complaints Still does not want novel agent   Lopressor dose changed to 12.5 bid due to headache and fatigue   Had trauma to left gluteal muscle 12/15/14   Reviewed CT and large intramuscular hematoma. ASA/Xarelto held Resolved   ROS: Denies fever, malais, weight loss, blurry vision, decreased visual acuity, cough, sputum, SOB, hemoptysis, pleuritic pain, palpitaitons, heartburn, abdominal pain, melena, lower extremity edema, claudication, or rash.  All other systems reviewed and negative  General: Affect appropriate Healthy:  appears stated age HEENT: normal Neck supple with no adenopathy JVP normal no bruits no  thyromegaly Lungs clear with no wheezing and good diaphragmatic motion Heart:  S1/S2 no murmur, no rub, gallop or click PMI normal Abdomen: benighn, BS positve, no tenderness, no AAA no bruit.  No HSM or HJR Distal pulses intact with no bruits No edema Neuro non-focal Skin warm and dry  Bruising on back of hands  No muscular weakness Still with left gluteal hematoma and mild bruising lower back and lateral aspect of left leg   Current Outpatient Prescriptions  Medication Sig Dispense Refill  . allopurinol (ZYLOPRIM) 150 mg TABS tablet Take 150 mg by mouth daily.    Marland Kitchen atorvastatin (LIPITOR) 20 MG tablet TAKE ONE TABLET BY MOUTH ON MONDAY, WEDNESDAY, FRIDAY AND SUNDAY. 51 tablet 0  . atorvastatin (LIPITOR) 20 MG tablet TAKE ONE TABLET BY MOUTH ON MONDAY, WEDNESDAY, FRIDAY AND SUNDAY. 51 tablet 0  . fluticasone (FLONASE) 50 MCG/ACT nasal spray PLACE 1 SPRAY INTO BOTH NOSTRILS TWICE DAILY 16 g 2  . Fluticasone-Salmeterol (ADVAIR DISKUS) 250-50 MCG/DOSE AEPB INHALE 1 PUFF BY MOUTH EVERY 12 HOURS. 60 each 5  . lisinopril (PRINIVIL,ZESTRIL) 20 MG tablet TAKE 1/2 TABLET BY MOUTH DAILY 45 tablet 3  . metoprolol tartrate (LOPRESSOR) 25 MG tablet Take 0.5 tablets (12.5 mg total) by mouth 2 (two) times daily. 90 tablet 3  . Multiple Vitamin (MULTIVITAMIN) tablet Take 1 tablet by mouth daily.      [name] 20 MG TABS tablet TAKE 1 TABLET (20 MG TOTAL) BY MOUTH DAILY WITH SUPPER. 30 tablet 5   No current facility-administered medications for this visit.     Allergies  Patient has no known allergies.  Electrocardiogram:  afib otherwise normal ECG 2014  2015  Afib  rate 55 othewise normal   10/03/14  afib rate 44 no change  01/04/15 Afib rate 62 otherwise normal   Assessment and Plan Chronic afib: good rate control On Xarelto 20  mg  Will get labs with primary     Hematoma:  12/2014  Resolved related to trauma hopefully will not recur  No asa   HTN: Well controlled.  Continue current  medications and low sodium Dash type diet.    Chol:   Lab Results  Component Value Date   LDLCALC 62 01/17/2016    COPD:  No active wheezing yearly CXR Advair    F/U with me in 6 months   Charlton HawsPeter Meliah Appleman

## 2016-03-04 ENCOUNTER — Encounter: Payer: Self-pay | Admitting: Cardiovascular Disease

## 2016-03-05 ENCOUNTER — Ambulatory Visit: Payer: Medicare Other | Admitting: Cardiovascular Disease

## 2016-03-19 ENCOUNTER — Other Ambulatory Visit: Payer: Self-pay

## 2016-03-19 MED ORDER — FLUTICASONE-SALMETEROL 250-50 MCG/DOSE IN AEPB: INHALATION_SPRAY | RESPIRATORY_TRACT | 5 refills | Status: DC

## 2016-04-03 ENCOUNTER — Other Ambulatory Visit: Payer: Self-pay | Admitting: Family

## 2016-04-09 ENCOUNTER — Other Ambulatory Visit: Payer: Self-pay | Admitting: Family

## 2016-04-17 NOTE — Progress Notes (Signed)
Patient ID: Reginald Green, male   DOB: January 13, 1945, 71 y.o.   MRN: [id number]   Reginald Green is a 71 y.o.  male who presents for follow up . He has a history of chronic atrial fibrillation on  anticoagulation, hypertension, COPD, gout, hyperlipidemia.  Echocardiogram 01/15/11 for mild LVH, EF 65%, moderate to severe LAE, moderate RAE. Myoview 01/18/11: No scar or ischemia. Study not gated due to a true fibrillation.  Event monitor 2/13 with persistant afib. No pauses and reasonable rate control PVC no sustained VT  No complaints Still does not want novel agent   On NOAC for anticoagulaiton   Lopressor dose changed to 12.5 bid due to headache and fatigue   Had trauma to left gluteal muscle 12/15/15  Reviewed CT and large intramuscular hematoma. ASA/Xarelto held  Has a brother moving here from [place] so daughter can go to [place]   ROS: Denies fever, malais, weight loss, blurry vision, decreased visual acuity, cough, sputum, SOB, hemoptysis, pleuritic pain, palpitaitons, heartburn, abdominal pain, melena, lower extremity edema, claudication, or rash.  All other systems reviewed and negative  General: Affect appropriate Healthy:  appears stated age HEENT: normal Neck supple with no adenopathy JVP normal no bruits no thyromegaly Lungs clear with no wheezing and good diaphragmatic motion Heart:  S1/S2 no murmur, no rub, gallop or click PMI normal Abdomen: benighn, BS positve, no tenderness, no AAA no bruit.  No HSM or HJR Distal pulses intact with no bruits No edema Neuro non-focal Skin warm and dry  Bruising on back of hands  No muscular weakness Still with left gluteal hematoma and mild bruising lower back and lateral aspect of left leg   Current Outpatient Prescriptions  Medication Sig Dispense Refill  . allopurinol (ZYLOPRIM) 150 mg TABS tablet Take 150 mg by mouth daily.    Marland Kitchen atorvastatin (LIPITOR) 20 MG tablet TAKE ONE TABLET BY MOUTH ON MONDAY, WEDNESDAY, FRIDAY AND SUNDAY. 51  tablet 0  . fluticasone (FLONASE) 50 MCG/ACT nasal spray SPRAY ONE SPRAY INTO BOTH NOSTRILS TWICE A DAY 16 g 1  . Fluticasone-Salmeterol (ADVAIR DISKUS) 250-50 MCG/DOSE AEPB INHALE 1 PUFF BY MOUTH EVERY 12 HOURS. 60 each 5  . lisinopril (PRINIVIL,ZESTRIL) 20 MG tablet TAKE 1/2 TABLET BY MOUTH DAILY 45 tablet 3  . Multiple Vitamin (MULTIVITAMIN) tablet Take 1 tablet by mouth daily.      . rivaroxaban (XARELTO) 20 MG TABS tablet Take 1 tablet (20 mg total) by mouth daily with supper. 90 tablet 3  . metoprolol tartrate (LOPRESSOR) 25 MG tablet Take 0.5 tablets (12.5 mg total) by mouth 2 (two) times daily. 90 tablet 3   No current facility-administered medications for this visit.     Allergies  Patient has no known allergies.  Electrocardiogram:  afib otherwise normal ECG 2014  2015  Afib rate 55 othewise normal   10/03/14  afib rate 44 no change  01/04/15 Afib rate 62 otherwise normal  04/22/16  Afib rate 61 normal otherwise   Assessment and Plan Chronic afib: good rate control On Xarelto 20  mg  Will get labs with primary in January   Hematoma:  Resolved related to trauma hopefully will not recur  No asa   HTN: Well controlled.  Continue current medications and low sodium Dash type diet.    Chol:   Lab Results  Component Value Date   LDLCALC 62 01/17/2016    COPD:  No active wheezing yearly CXR Advair    F/U with me in  6 months   Charlton Haws

## 2016-04-22 ENCOUNTER — Encounter: Payer: Self-pay | Admitting: Cardiovascular Disease

## 2016-04-22 ENCOUNTER — Ambulatory Visit (INDEPENDENT_AMBULATORY_CARE_PROVIDER_SITE_OTHER): Payer: Medicare Other | Admitting: Cardiovascular Disease

## 2016-04-22 VITALS — BP 142/76 | HR 81 | Ht 75.0 in | Wt 218.8 lb

## 2016-04-22 DIAGNOSIS — I482 Chronic atrial fibrillation, unspecified: Secondary | ICD-10-CM

## 2016-04-22 DIAGNOSIS — I10 Essential (primary) hypertension: Secondary | ICD-10-CM

## 2016-04-22 MED ORDER — RIVAROXABAN 20 MG PO TABS: 20.0000 mg | ORAL_TABLET | Freq: Every day | ORAL | 3 refills | Status: DC

## 2016-04-22 NOTE — Patient Instructions (Addendum)

## 2016-06-28 ENCOUNTER — Other Ambulatory Visit: Payer: Self-pay | Admitting: Family

## 2016-07-02 ENCOUNTER — Other Ambulatory Visit: Payer: Self-pay | Admitting: Family

## 2016-08-16 ENCOUNTER — Other Ambulatory Visit: Payer: Self-pay | Admitting: Cardiovascular Disease

## 2016-08-21 IMAGING — CR DG HIP (WITH OR WITHOUT PELVIS) 2-3V*L*
3 series · 3 of 3 positions shown · non-contrast
Comparison: None available.

CLINICAL DATA: Fall.  Left buttock pain.  Left hip pain.

EXAM:
DG HIP (WITH OR WITHOUT PELVIS) 2-3V LEFT

[t hip ap left]
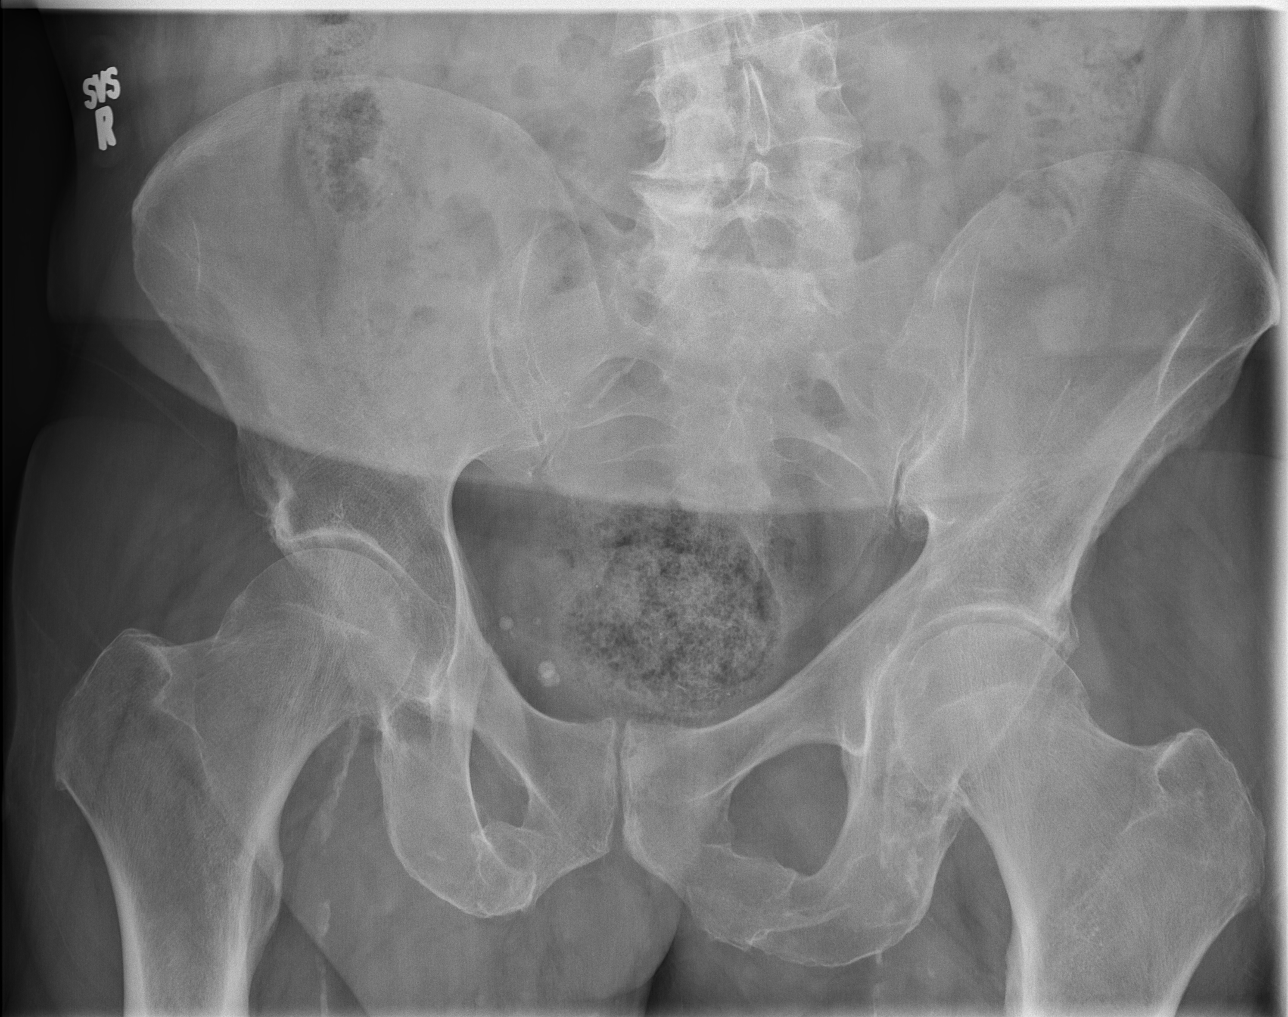

[t hip frog leg left (1 of 2)]
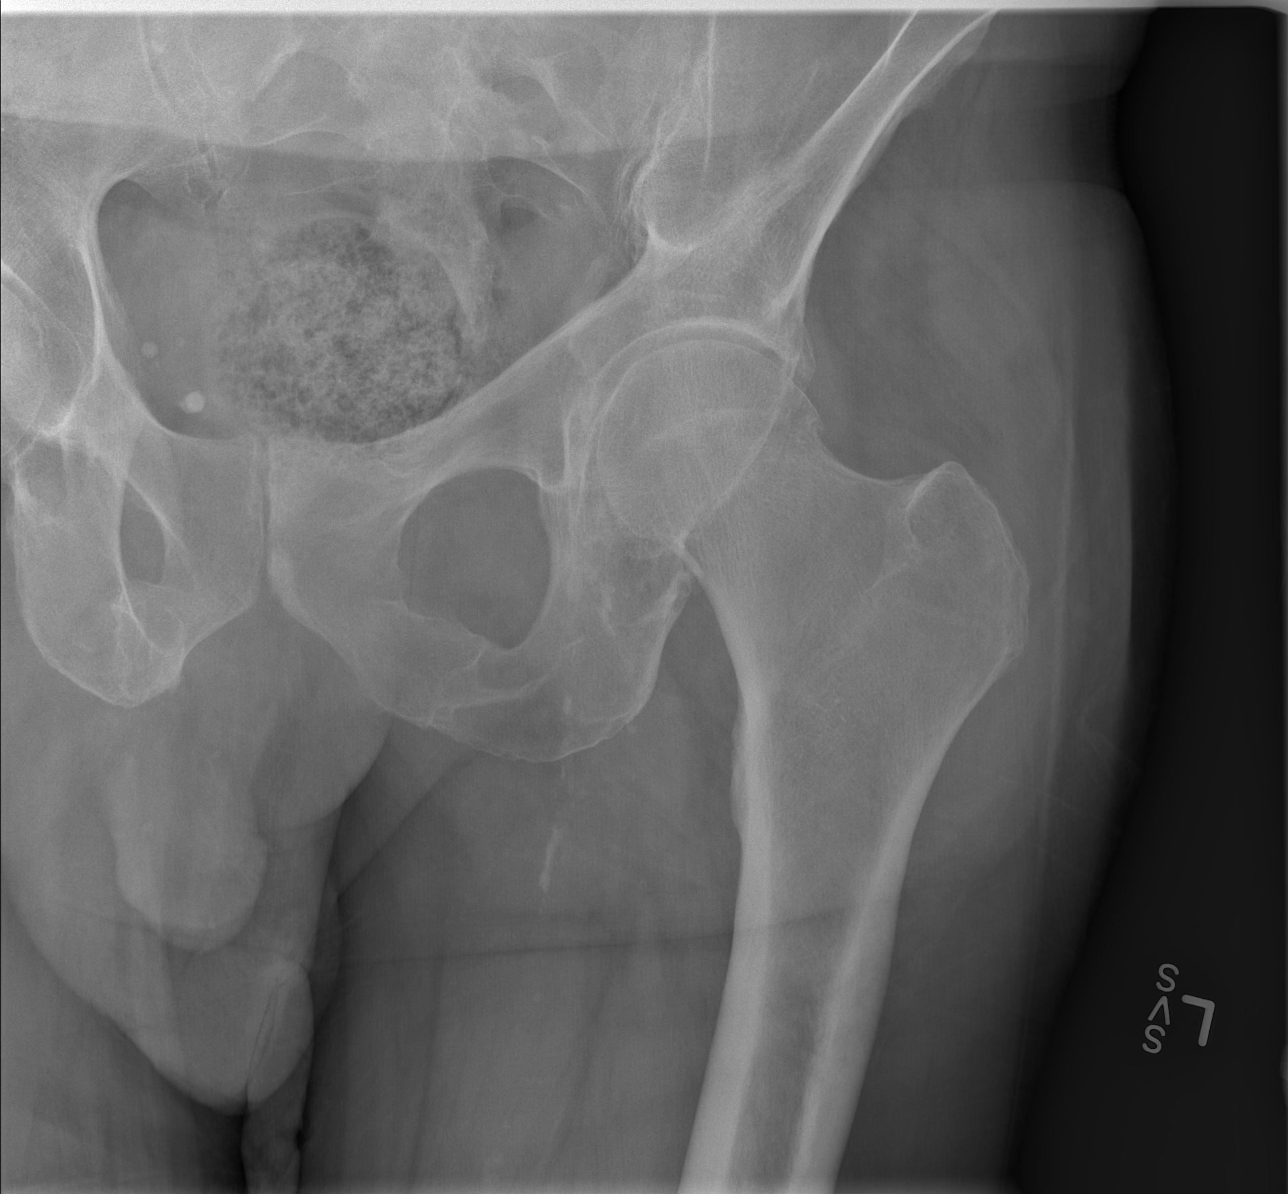

[t hip frog leg left (2 of 2)]
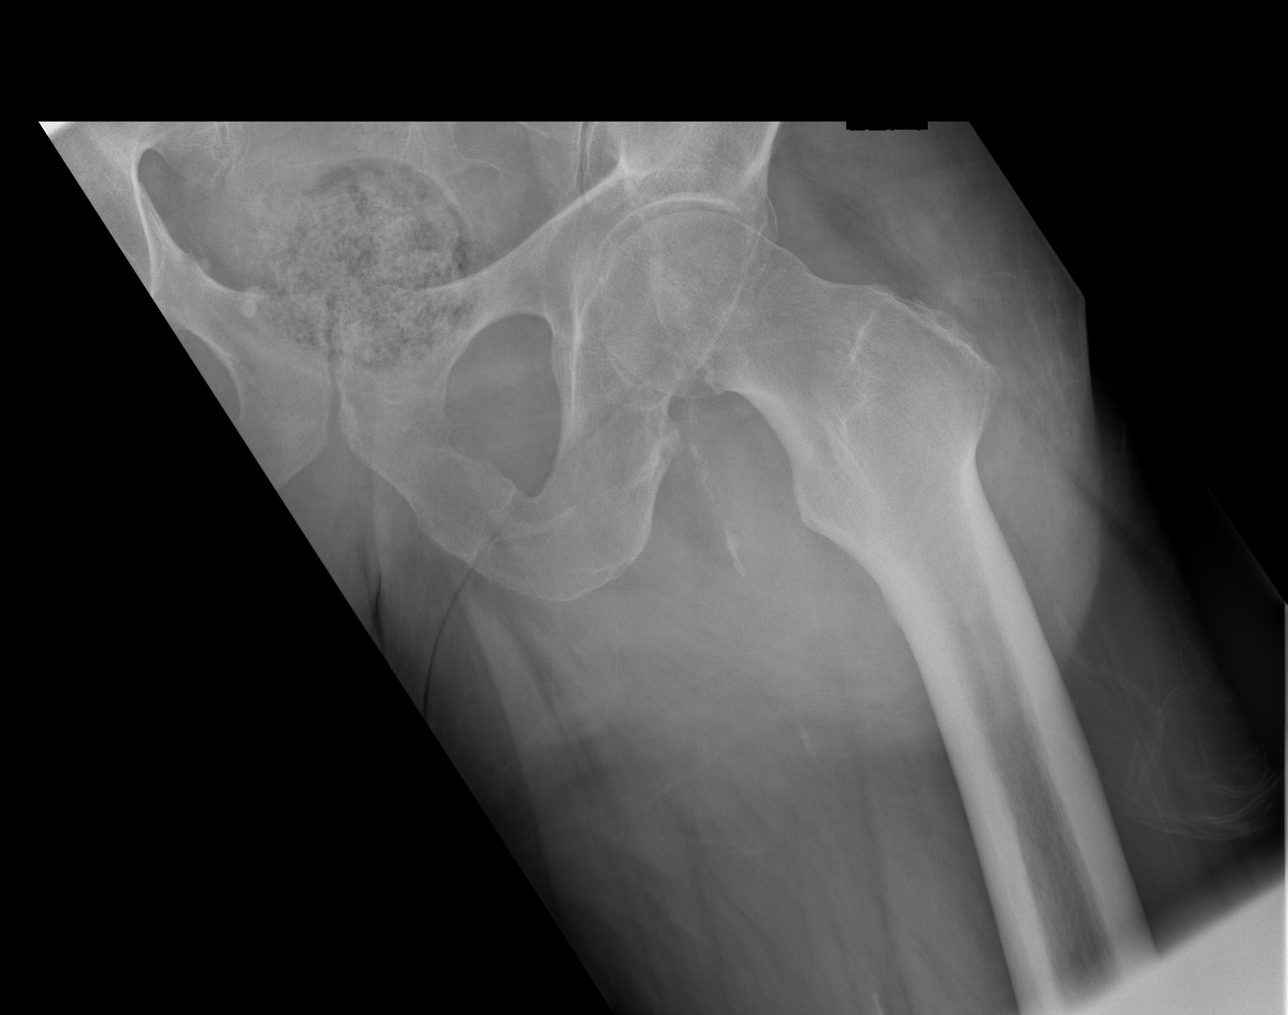

[3 of 3 positions shown; findings below may reference images not displayed]

FINDINGS: Left hip is located. No acute bone or soft tissue abnormalities are
present. Atherosclerotic changes are present. The pelvis is intact.
Asymmetric right-sided degenerative changes are noted in the lower
lumbar spine
IMPRESSION: 1. No acute abnormality of the left hip.
2. Degenerative changes in the lower lumbar spine.

## 2016-08-21 IMAGING — CT CT PELVIS W/ CM
2 of 6 series · 15 of 46 positions shown, 18 images · IV contrast (omnipaque)
Comparison: CT dated 08/06/2010

CLINICAL DATA: 69-year-old male with concern for left gluteal
hematoma. Patient is on Coumadin

EXAM:
CT PELVIS WITH CONTRAST
TECHNIQUE: Multidetector CT imaging of the pelvis was performed using the
standard protocol following the bolus administration of intravenous
contrast.
CONTRAST:  100mL OMNIPAQUE IOHEXOL 300 MG/ML  SOLN

[Series 2: pelvis with · axial · 0.81mm/px · z∈[+894,+1144]mm · 12 of 58 slices shown, 15 images]
[im 5/58  soft-tissue]
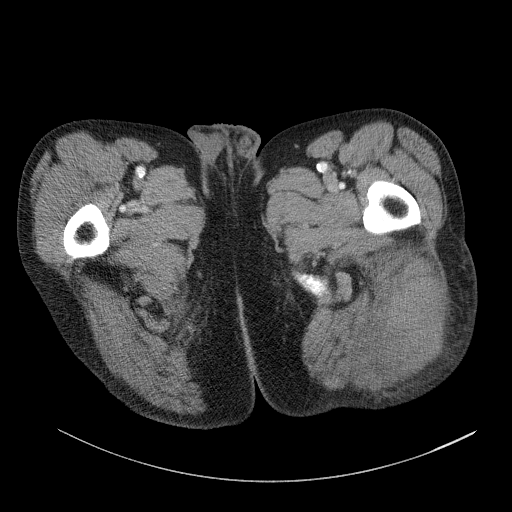
[im 5/58  bone]
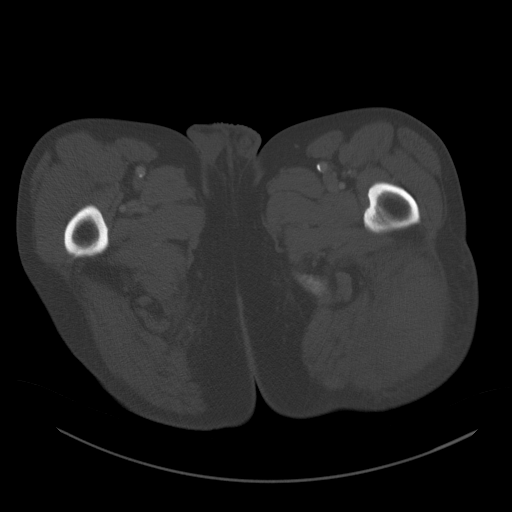
[im 11/58  soft-tissue]
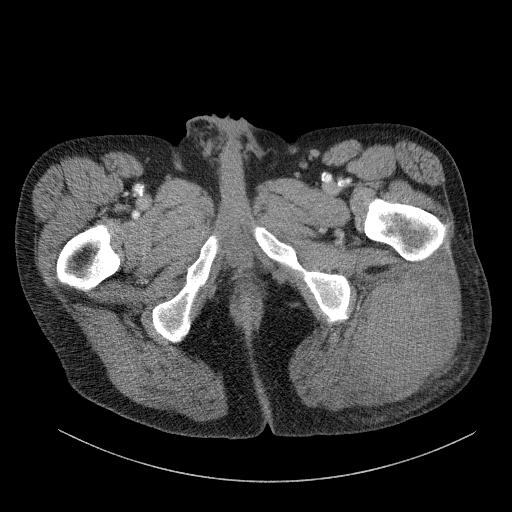
[im 16/58  soft-tissue]
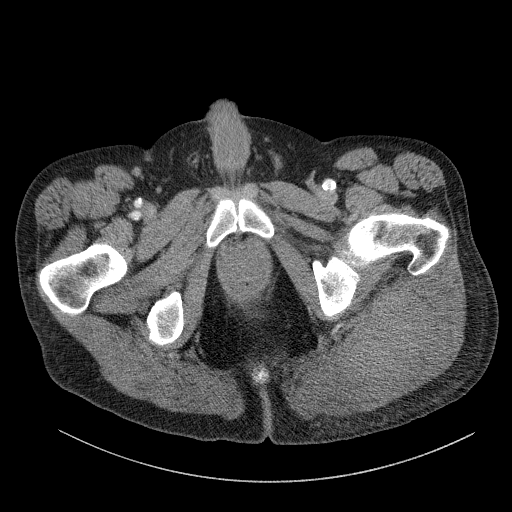
[im 22/58  soft-tissue]
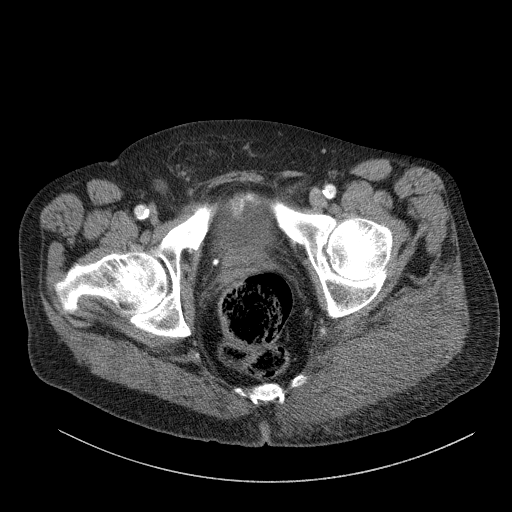
[im 29/58  soft-tissue]
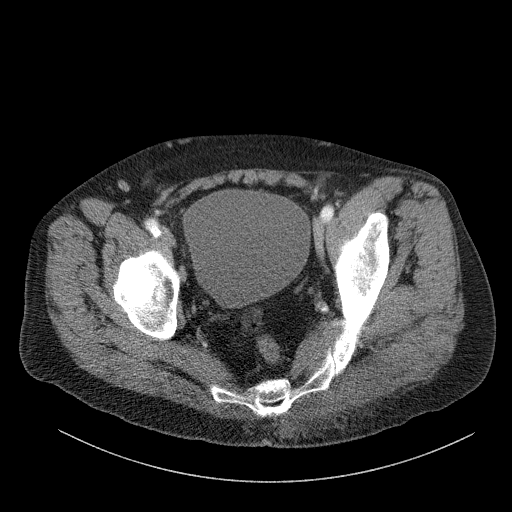
[im 36/58  soft-tissue]
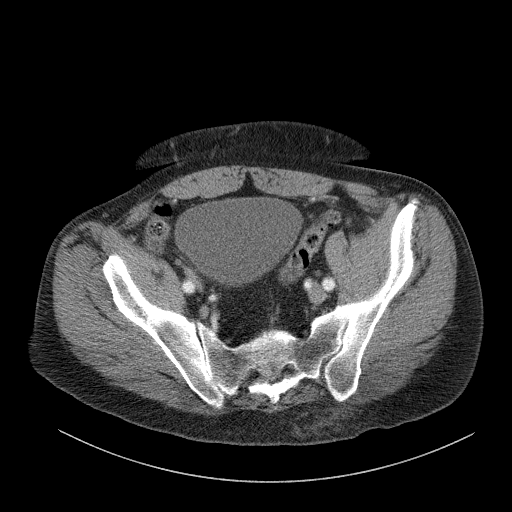
[im 42/58  soft-tissue]
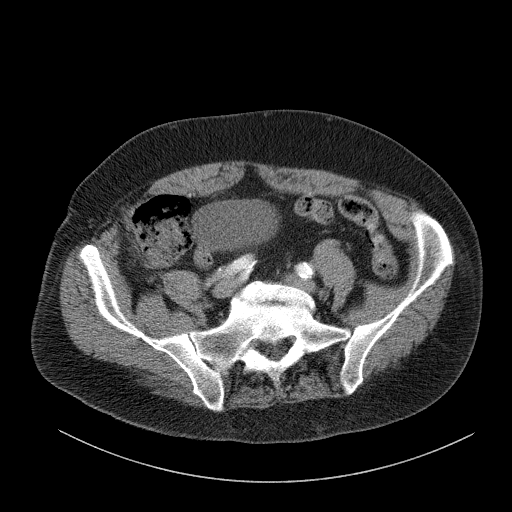
[im 47/58  soft-tissue]
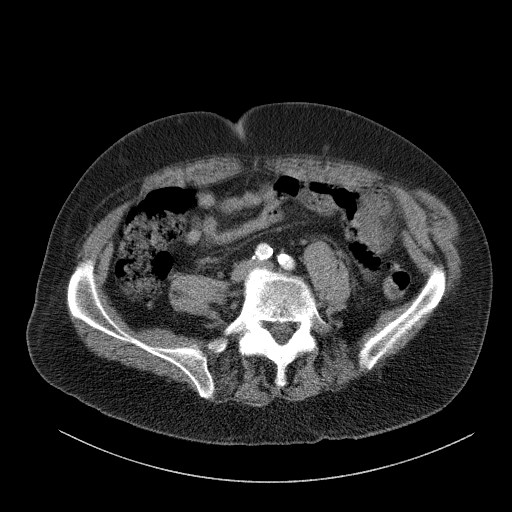
[im 49/58  lung]
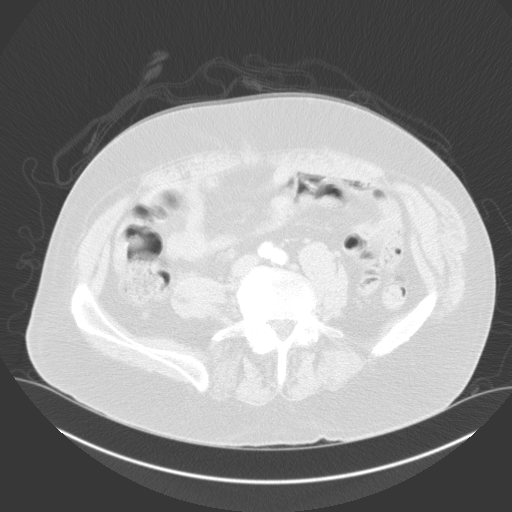
[im 51/58  lung]
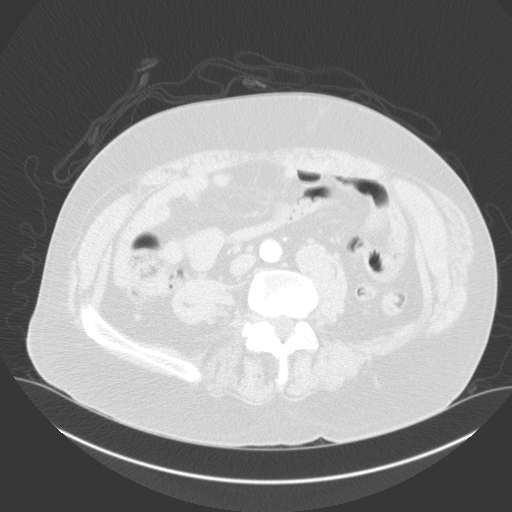
[im 53/58  soft-tissue]
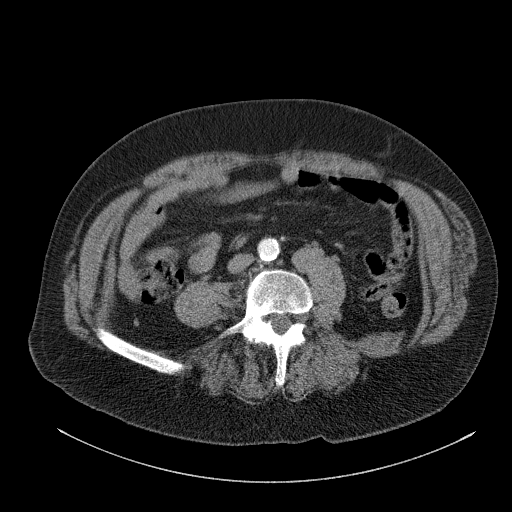
[im 53/58  lung]
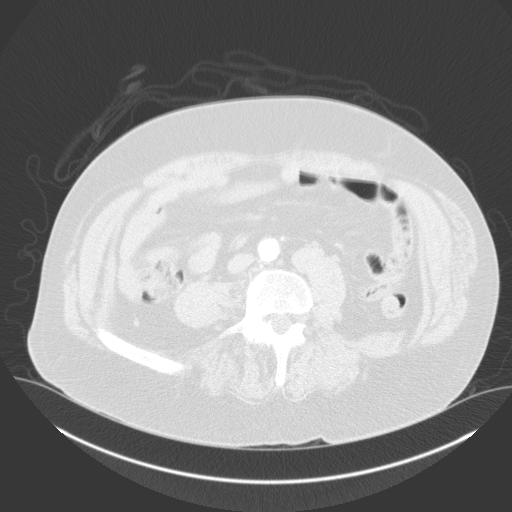
[im 53/58  bone]
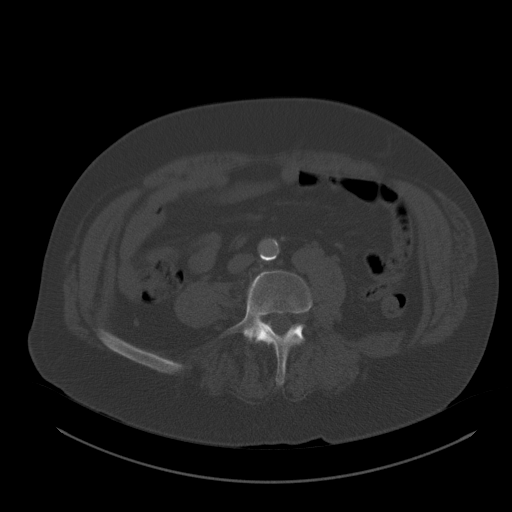
[im 55/58  lung]
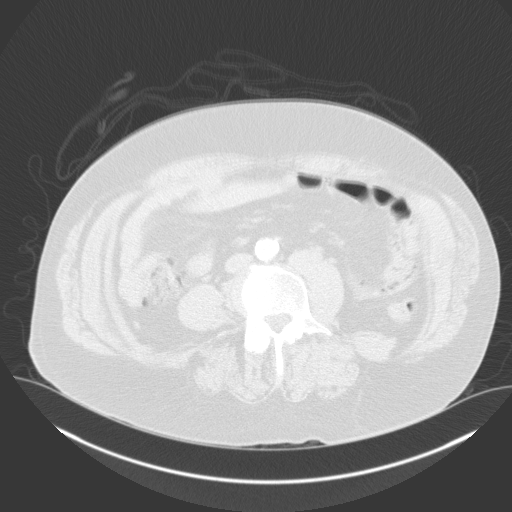

[Series 6: coronal images · coronal · 0.62mm/px · 3 of 100 slices shown]
[im 25/100  soft-tissue]
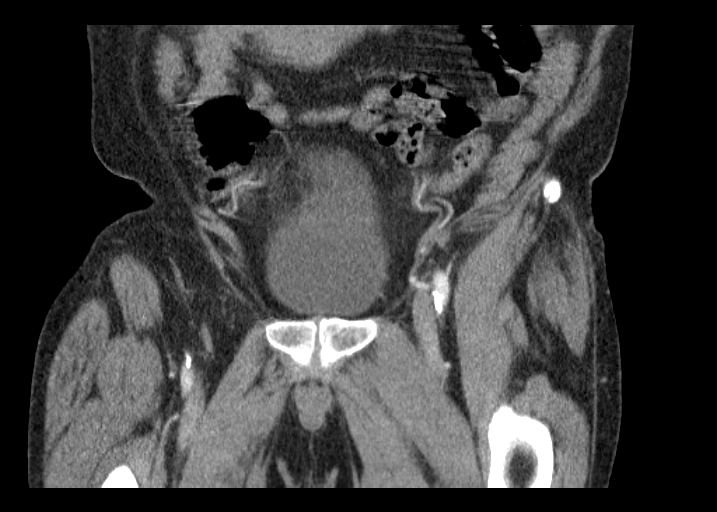
[im 50/100  soft-tissue]
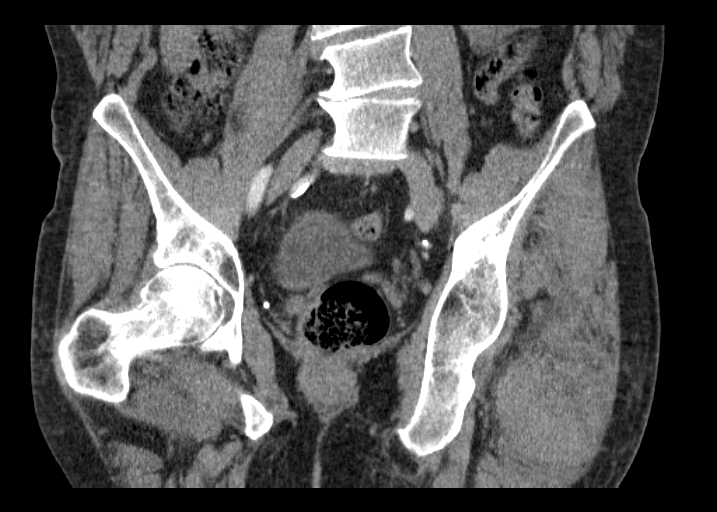
[im 75/100  soft-tissue]
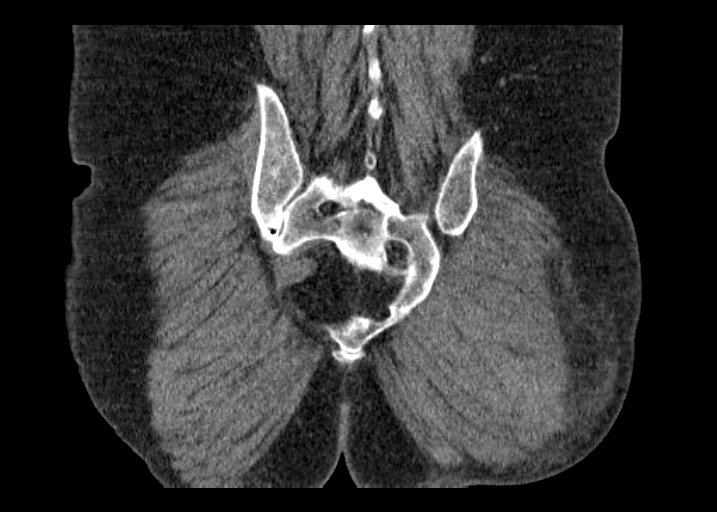

[15 of 46 positions shown; findings below may reference images not displayed]

FINDINGS: No free air or fluid identified within the pelvis. The urinary
bladder, prostate gland, and seminal vesicles are grossly
unremarkable. There is sigmoid diverticulosis without active
inflammation. The visualized appendix appears unremarkable. There is
aortoiliac atherosclerotic disease. No adenopathy noted within the
pelvis. Old healed bilateral pubic bone fractures. No acute
fracture.

There is a left gluteal intramuscular hematoma measuring
approximately 7.1 x 10.7 cm in greatest axial dimensions. Follow-up
recommended.
IMPRESSION: Left gluteal intramuscular hematoma.  Follow-up recommended.

## 2016-09-03 ENCOUNTER — Other Ambulatory Visit: Payer: Self-pay | Admitting: Family

## 2016-09-22 ENCOUNTER — Other Ambulatory Visit: Payer: Self-pay | Admitting: Family

## 2016-09-26 ENCOUNTER — Encounter: Payer: Self-pay | Admitting: Cardiovascular Disease

## 2016-09-30 ENCOUNTER — Other Ambulatory Visit: Payer: Self-pay | Admitting: Family

## 2016-10-06 ENCOUNTER — Other Ambulatory Visit: Payer: Self-pay | Admitting: Cardiovascular Disease

## 2016-10-07 ENCOUNTER — Encounter: Payer: Self-pay | Admitting: Family

## 2016-10-08 ENCOUNTER — Telehealth: Payer: Self-pay | Admitting: Family Medicine

## 2016-10-08 NOTE — Telephone Encounter (Signed)
Patient transferring from [facility] to Dr. Jimmey Ralph due to provider leaving. Will call when appointment needed.

## 2016-10-31 ENCOUNTER — Other Ambulatory Visit: Payer: Self-pay | Admitting: *Deleted

## 2016-11-20 ENCOUNTER — Ambulatory Visit: Payer: Medicare Other | Admitting: Family Medicine

## 2016-11-20 ENCOUNTER — Encounter: Payer: Self-pay | Admitting: Family Medicine

## 2016-11-20 VITALS — BP 130/78 | HR 66 | Ht 75.0 in | Wt 215.4 lb

## 2016-11-20 DIAGNOSIS — J449 Chronic obstructive pulmonary disease, unspecified: Secondary | ICD-10-CM

## 2016-11-20 DIAGNOSIS — I10 Essential (primary) hypertension: Secondary | ICD-10-CM | POA: Diagnosis not present

## 2016-11-20 DIAGNOSIS — E782 Mixed hyperlipidemia: Secondary | ICD-10-CM | POA: Diagnosis not present

## 2016-11-20 DIAGNOSIS — Z8739 Personal history of other diseases of the musculoskeletal system and connective tissue: Secondary | ICD-10-CM | POA: Diagnosis not present

## 2016-11-20 MED ORDER — FLUTICASONE PROPIONATE 50 MCG/ACT NA SUSP: NASAL | 3 refills | Status: DC

## 2016-11-20 MED ORDER — ATORVASTATIN CALCIUM 20 MG PO TABS: 20.0000 mg | ORAL_TABLET | Freq: Every day | ORAL | 3 refills | Status: DC

## 2016-11-20 NOTE — Assessment & Plan Note (Addendum)
At goal.  Continue lisinopril and metoprolol tartrate.  Given his dry cough and history of gout, it would be reasonable to switch lisinopril to losartan. Discussed with patient. Given his well-controlled BP this was deferred for today.  But will address at future visits.

## 2016-11-20 NOTE — Assessment & Plan Note (Signed)
Doing well on allopurinol.  Check gout with next set of lab draws goal less than 6.  Consider switching lisinopril to losartan (see below problem)

## 2016-11-20 NOTE — Assessment & Plan Note (Signed)
Stable.  Continue Advair. 

## 2016-11-20 NOTE — Assessment & Plan Note (Signed)
Continue atorvastatin 20 mg daily.  Last LDL 62.  Check lipid panel with next set of labs.

## 2016-11-20 NOTE — Progress Notes (Signed)
   Subjective:  Reginald Green is a 71 y.o. male who presents today with a chief complaint of COPD.   HPI: [facility] however [facility] has [facility].  He will be establishing here.  COPD, chronic issue, stable Currently on Advair daily.  Doing well on this dose.  Has been mentation a long time" since his last flareup.  Gout, chronic issue, stable Currently on allopurinol 150 mg daily.  Is also been avoiding purine rich diet.  Does not remember his last gout flare.  Hyperlipidemia, chronic issue, stable Currently on atorvastatin 20 mg daily.  Tolerating this dose without side effects.  No chest pain or shortness of breath.  Hypertension, chronic issue, stable Currently on lisinopril 20 mg daily and metoprolol tartrate 25 mg twice daily.  Tolerating this regimen well without obvious side effects, however does note a dry cough in the mornings.    ROS: Per HPI  PMH: Smoking history reviewed.  Former smoker.  Objective:  Physical Exam: BP 130/78   Pulse 66   Ht 6\' 3"  (1.905 m)   Wt 215 lb 6.4 oz (97.7 kg)   SpO2 99%   BMI 26.92 kg/m   Gen: NAD, resting comfortably CV: Irregular, no murmurs Pulm: NWOB, CTAB with no crackles, wheezes, or rhonchi GI: Normal bowel sounds present. Soft, Nontender, Nondistended. Skin: Warm, dry Neuro: Grossly normal, moves all extremities Psych: Normal affect and thought content  Assessment/Plan:  COPD (chronic obstructive pulmonary disease) (HCC) Stable.  Continue Advair.  History of gout Doing well on allopurinol.  Check gout with next set of lab draws goal less than 6.  Consider switching lisinopril to losartan (see below problem)  Hyperlipidemia Continue atorvastatin 20 mg daily.  Last LDL 62.  Check lipid panel with next set of labs.  Hypertension At goal.  Continue lisinopril and metoprolol tartrate.  Given his dry cough and history of gout, it would be reasonable to switch lisinopril to losartan.  Discussed with patient. Given his well-controlled BP this was deferred for today.  But will address at future visits.  Katina Degreealeb M. Jimmey RalphParker, MD 11/20/2016 10:16 AM

## 2016-12-21 ENCOUNTER — Other Ambulatory Visit: Payer: Self-pay | Admitting: Family

## 2017-01-01 ENCOUNTER — Other Ambulatory Visit: Payer: Self-pay | Admitting: Family

## 2017-01-03 ENCOUNTER — Other Ambulatory Visit: Payer: Self-pay | Admitting: Family

## 2017-01-06 ENCOUNTER — Other Ambulatory Visit: Payer: Self-pay

## 2017-01-06 MED ORDER — ALLOPURINOL 300 MG PO TABS: 300.0000 mg | ORAL_TABLET | Freq: Every day | ORAL | 0 refills | Status: DC

## 2017-01-21 ENCOUNTER — Encounter: Payer: Self-pay | Admitting: Family Medicine

## 2017-01-22 ENCOUNTER — Other Ambulatory Visit: Payer: Self-pay | Admitting: Family Medicine

## 2017-01-22 DIAGNOSIS — N138 Other obstructive and reflux uropathy: Secondary | ICD-10-CM

## 2017-01-22 DIAGNOSIS — N401 Enlarged prostate with lower urinary tract symptoms: Secondary | ICD-10-CM

## 2017-01-22 DIAGNOSIS — I10 Essential (primary) hypertension: Secondary | ICD-10-CM

## 2017-01-22 DIAGNOSIS — E782 Mixed hyperlipidemia: Secondary | ICD-10-CM

## 2017-01-23 NOTE — Progress Notes (Signed)
Pre visit review using our clinic review tool, if applicable. No additional management support is needed unless otherwise documented below in the visit note. 

## 2017-01-23 NOTE — Progress Notes (Signed)
PCP notes:   Health maintenance: NA.   Abnormal screenings:  None.   Patient concerns: None.   Nurse concerns: None.   Next PCP appt: 01/27/17

## 2017-01-23 NOTE — Progress Notes (Signed)
Subjective:   Reginald Green is a 72 y.o. male who presents for Medicare Annual/Subsequent preventive examination.  Review of Systems:  No ROS.  Medicare Wellness Visit. Additional risk factors are reflected in the social history.  Sleeps 8 hrs/night. No naps. Gets up to use restroom during night.   Cardiac Risk Factors include: advanced age (>75men, >28 women);dyslipidemia;hypertension;male gender     Objective:    Vitals: BP 112/78 (BP Location: Left Arm, Patient Position: Sitting, Cuff Size: Normal)   Pulse 74   Resp 16   Ht 6\' 3"  (1.905 m)   Wt 216 lb 9.6 oz (98.2 kg)   SpO2 97%   BMI 27.07 kg/m   Body mass index is 27.07 kg/m.  Advanced Directives 01/24/2017 12/13/2014 01/10/2011  Does Patient Have a Medical Advance Directive? Yes No Patient has advance directive, copy not in chart  Type of Advance Directive Healthcare Power of [place];Living will - Living will  Does patient want to make changes to medical advance directive? No - Patient declined - -  Copy of Healthcare Power of Attorney in Chart? No - copy requested - -  Pre-existing out of facility DNR order (yellow form or pink MOST form) - - No    Tobacco Social History   Tobacco Use  Smoking Status Former Smoker  . Packs/day: 2.00  . Years: 22.00  . Pack years: 44.00  . Types: Cigarettes  . Last attempt to quit: 02/01/1987  . Years since quitting: 30.0  Smokeless Tobacco Never Used  Tobacco Comment   smoked 1967-1989 , up to 2 ppd     Counseling given: Not Answered Comment: smoked 1967-1989 , up to 2 ppd  Past Medical History:  Diagnosis Date  . Chronic atrial fibrillation (HCC)    Multiple failed cardioversions (even on flecanide). Normal LV function by echo in 2009 with LA 45mm, reported nonischemic myoview at that time  . COPD (chronic obstructive pulmonary disease) (HCC)   . Gilbert's disease   . Gout   . Hyperlipidemia   . Hypertension    Past Surgical History:  Procedure Laterality Date    . APPENDECTOMY     At time of hernia repair in 1979  . COLONOSCOPY  2011   neg, [facility], Due 2021   . FLEXIBLE SIGMOIDOSCOPY  05/12/1997  . HERNIA REPAIR     Undescended testicle brought down at this time in 1979  . TONSILLECTOMY     Family History  Problem Relation Age of Onset  . Stroke Mother 50  . Hypertension Mother   . Heart attack Father 75  . Heart attack Maternal Uncle 55  . Diabetes Maternal Grandmother   . Asthma Neg Hx   . COPD Neg Hx    Social History   Socioeconomic History  . Marital status: Married    Spouse name: None  . Number of children: 3  . Years of education: 25  . Highest education level: None  Social Needs  . Financial resource strain: None  . Food insecurity - worry: None  . Food insecurity - inability: None  . Transportation needs - medical: None  . Transportation needs - non-medical: None  Occupational History  . Occupation: Retired   Tobacco Use  . Smoking status: Former Smoker    Packs/day: 2.00    Years: 22.00    Pack years: 44.00    Types: Cigarettes    Last attempt to quit: 02/01/1987    Years since quitting: 30.0  . Smokeless  tobacco: Never Used  . Tobacco comment: smoked 1967-1989 , up to 2 ppd  Substance and Sexual Activity  . Alcohol use: Yes    Alcohol/week: 8.4 oz    Types: 14 Cans of beer per week  . Drug use: No  . Sexual activity: No  Other Topics Concern  . None  Social History Narrative   Fun: Work out in the yard, chase [employer].     Outpatient Encounter Medications as of 01/24/2017  Medication Sig  . ADVAIR DISKUS 250-50 MCG/DOSE AEPB INHALE ONE PUFF BY MOUTH EVERY 12 HOURS  . allopurinol (ZYLOPRIM) 300 MG tablet Take 1 tablet (300 mg total) by mouth daily. (Patient taking differently: Take 150 mg by mouth daily. )  . atorvastatin (LIPITOR) 20 MG tablet Take 1 tablet (20 mg total) daily by mouth. (Patient taking differently: Take 20 mg by mouth 4 (four) times a week. )  . fluticasone (FLONASE) 50  MCG/ACT nasal spray SHAKE GENTLY BEFORE USE AND INSTILL ONE SPRAY IN EACH NOSTRIL TWICE DAILY  . lisinopril (PRINIVIL,ZESTRIL) 20 MG tablet TAKE 1/2 (ONE-HALF) TABLET BY MOUTH DAILY  . metoprolol tartrate (LOPRESSOR) 25 MG tablet TAKE 1/2 TABLET BY MOUTH TWICE DAILY WITH FOOD  . rivaroxaban (XARELTO) 20 MG TABS tablet Take 1 tablet (20 mg total) by mouth daily with supper.  . Multiple Vitamin (MULTIVITAMIN) tablet Take 1 tablet by mouth daily.     No facility-administered encounter medications on file as of 01/24/2017.     Activities of Daily Living In your present state of health, do you have any difficulty performing the following activities: 01/24/2017  Hearing? N  Vision? N  Difficulty concentrating or making decisions? N  Walking or climbing stairs? N  Dressing or bathing? N  Doing errands, shopping? N  Preparing Food and eating ? N  Using the Toilet? N  In the past six months, have you accidently leaked urine? N  Do you have problems with loss of bowel control? N  Managing your Medications? N  Managing your Finances? N  Housekeeping or managing your Housekeeping? N  Some recent data might be hidden    Patient Care Team: Ardith Dark, MD as PCP - General (Family Medicine) Mia Creek, MD as Consulting Physician (Ophthalmology) Wendall Stade, MD as Consulting Physician (Cardiology)   Assessment:   This is a routine wellness examination for [place].  Exercise Activities and Dietary recommendations Current Exercise Habits: Home exercise routine, Type of exercise: walking;strength training/weights;stretching, Time (Minutes): 45, Frequency (Times/Week): 3, Weekly Exercise (Minutes/Week): 135, Intensity: Moderate, Exercise limited by: None identified  3 meals/day. Eats a healthy diet. Meals are cooked at home. Drinks 8 glasses decaf black tea throughout day. Drinks 2 cups half caff coffee. Watch portion sizes on sweets.   Goals    . Maintain current health status.        Fall Risk Fall Risk  01/24/2017 01/17/2016 05/09/2014  Falls in the past year? No No No   Depression Screen PHQ 2/9 Scores 01/24/2017 01/17/2016 05/09/2014  PHQ - 2 Score 0 0 0  PHQ- 9 Score 0 - -  PHQ2 and PHQ9 completed. No signs or symptoms of depression. Total time spent on topic was 12 minutes.  Cognitive Function Ad8 score reviewed for issues:  Issues making decisions:no  Less interest in hobbies / activities:no  Repeats questions, stories (family complaining):no  Trouble using ordinary gadgets (microwave, computer, phone):no  Forgets the month or year: no  Mismanaging finances: no  Remembering appts:no  Daily problems with thinking and/or memory:no Ad8 score is=0 Watches grand kids during week.         Immunization History  Administered Date(s) Administered  . Influenza, High Dose Seasonal PF 10/15/2013  . Influenza, Seasonal, Injecte, Preservative Fre 09/24/2012  . Influenza-Unspecified 10/17/2014, 10/20/2015, 10/15/2016  . Pneumococcal Conjugate-13 05/09/2014  . Pneumococcal Polysaccharide-23 05/05/2012  . Td 01/26/2010  . Zoster 06/25/2012, 02/16/2016   Screening Tests Health Maintenance  Topic Date Due  . COLONOSCOPY  02/16/2019  . TETANUS/TDAP  01/27/2020  . INFLUENZA VACCINE  Completed  . Hepatitis C Screening  Completed  . PNA vac Low Risk Adult  Completed      Plan:   Follow up with PCP as directed.  I have personally reviewed and noted the following in the patient's chart:   . Medical and social history . Use of alcohol, tobacco or illicit drugs  . Current medications and supplements . Functional ability and status . Nutritional status . Physical activity . Advanced directives . List of other physicians . Vitals . Screenings to include cognitive, depression, and falls . Referrals and appointments  In addition, I have reviewed and discussed with patient certain preventive protocols, quality metrics, and best practice recommendations.  A written personalized care plan for preventive services as well as general preventive health recommendations were provided to patient.     Sherrye Payorassandra J Drummond, RN  01/24/2017

## 2017-01-24 ENCOUNTER — Encounter: Payer: Self-pay | Admitting: Family Medicine

## 2017-01-24 ENCOUNTER — Encounter: Payer: Self-pay | Admitting: *Deleted

## 2017-01-24 ENCOUNTER — Ambulatory Visit (INDEPENDENT_AMBULATORY_CARE_PROVIDER_SITE_OTHER): Payer: Medicare Other | Admitting: *Deleted

## 2017-01-24 VITALS — BP 112/78 | HR 74 | Resp 16 | Ht 75.0 in | Wt 216.6 lb

## 2017-01-24 DIAGNOSIS — E782 Mixed hyperlipidemia: Secondary | ICD-10-CM

## 2017-01-24 DIAGNOSIS — N401 Enlarged prostate with lower urinary tract symptoms: Secondary | ICD-10-CM

## 2017-01-24 DIAGNOSIS — Z0000 Encounter for general adult medical examination without abnormal findings: Secondary | ICD-10-CM | POA: Diagnosis not present

## 2017-01-24 DIAGNOSIS — I10 Essential (primary) hypertension: Secondary | ICD-10-CM | POA: Diagnosis not present

## 2017-01-24 DIAGNOSIS — Z1331 Encounter for screening for depression: Secondary | ICD-10-CM | POA: Diagnosis not present

## 2017-01-24 DIAGNOSIS — N138 Other obstructive and reflux uropathy: Secondary | ICD-10-CM | POA: Diagnosis not present

## 2017-01-24 LAB — COMPREHENSIVE METABOLIC PANEL
ALK PHOS: 54 U/L (ref 39–117)
ALT: 15 U/L (ref 0–53)
AST: 19 U/L (ref 0–37)
Albumin: 4 g/dL (ref 3.5–5.2)
BILIRUBIN TOTAL: 1.2 mg/dL (ref 0.2–1.2)
BUN: 18 mg/dL (ref 6–23)
CALCIUM: 9.2 mg/dL (ref 8.4–10.5)
CO2: 28 mEq/L (ref 19–32)
CREATININE: 1.04 mg/dL (ref 0.40–1.50)
Chloride: 102 mEq/L (ref 96–112)
GFR: 75.07 mL/min (ref >60.00)
GLUCOSE: 98 mg/dL (ref 70–99)
Potassium: 4.5 mEq/L (ref 3.5–5.1)
Sodium: 135 mEq/L (ref 135–145)
Total Protein: 6.6 g/dL (ref 6.0–8.3)

## 2017-01-24 LAB — CBC
HCT: 44.2 % (ref 39.0–52.0)
Hemoglobin: 15.2 g/dL (ref 13.0–17.0)
MCHC: 34.5 g/dL (ref 30.0–36.0)
MCV: 100.1 fl — AB (ref 78.0–100.0)
PLATELETS: 244 10*3/uL (ref 150.0–400.0)
RBC: 4.41 Mil/uL (ref 4.22–5.81)
RDW: 12.4 % (ref 11.5–15.5)
WBC: 7.1 10*3/uL (ref 4.0–10.5)

## 2017-01-24 LAB — LIPID PANEL
CHOL/HDL RATIO: 2
CHOLESTEROL: 135 mg/dL (ref 0–200)
HDL: 59.7 mg/dL (ref >39.00)
LDL CALC: 64 mg/dL (ref 0–99)
NonHDL: 75.7
TRIGLYCERIDES: 60 mg/dL (ref 0.0–149.0)
VLDL: 12 mg/dL (ref 0.0–40.0)

## 2017-01-24 LAB — PSA: PSA: 0.21 ng/mL (ref 0.10–4.00)

## 2017-01-24 NOTE — Progress Notes (Signed)
I have personally reviewed the Medicare Annual Wellness Visit and agree with the assessment and plan.  Katina Degreealeb M. Jimmey RalphParker, MD 01/24/2017 10:09 AM

## 2017-01-24 NOTE — Progress Notes (Signed)
Labs all normal. We can discuss at our appointment on Monday.

## 2017-01-24 NOTE — Patient Instructions (Addendum)
[name] ,  Bring a copy of your living will and/or healthcare power of attorney to your next office visit.  Thank you for taking time to come for your Medicare Wellness Visit. I appreciate your ongoing commitment to your health goals. Please review the following plan we discussed and let me know if I can assist you in the future.   These are the goals we discussed: Goals    . Maintain current health status.       This is a list of the screening recommended for you and due dates:  Health Maintenance  Topic Date Due  . Colon Cancer Screening  02/16/2019  . Tetanus Vaccine  01/27/2020  . Flu Shot  Completed  .  Hepatitis C: One time screening is recommended by Center for Disease Control  (CDC) for  adults born from [birth year] through 1965.   Completed  . Pneumonia vaccines  Completed   Preventive Care for Adults  A healthy lifestyle and preventive care can promote health and wellness. Preventive health guidelines for adults include the following key practices.  . A routine yearly physical is a good way to check with your health care provider about your health and preventive screening. It is a chance to share any concerns and updates on your health and to receive a thorough exam.  . Visit your dentist for a routine exam and preventive care every 6 months. Brush your teeth twice a day and floss once a day. Good oral hygiene prevents tooth decay and gum disease.  . The frequency of eye exams is based on your age, health, family medical history, use  of contact lenses, and other factors. Follow your health care provider's recommendations for frequency of eye exams.  . Eat a healthy diet. Foods like vegetables, fruits, whole grains, low-fat dairy products, and lean protein foods contain the nutrients you need without too many calories. Decrease your intake of foods high in solid fats, added sugars, and salt. Eat the right amount of calories for you. Get information about a proper diet from  your health care provider, if necessary.  . Regular physical exercise is one of the most important things you can do for your health. Most adults should get at least 150 minutes of moderate-intensity exercise (any activity that increases your heart rate and causes you to sweat) each week. In addition, most adults need muscle-strengthening exercises on 2 or more days a week.  Silver Sneakers may be a benefit available to you. To determine eligibility, you may visit the website: www.silversneakers.com or contact program at [phone] Mon-Fri between 8AM-8PM.   . Maintain a healthy weight. The body mass index (BMI) is a screening tool to identify possible weight problems. It provides an estimate of body fat based on height and weight. Your health care provider can find your BMI and can help you achieve or maintain a healthy weight.   For adults 20 years and older: ? A BMI below 18.5 is considered underweight. ? A BMI of 18.5 to 24.9 is normal. ? A BMI of 25 to 29.9 is considered overweight. ? A BMI of 30 and above is considered obese.   . Maintain normal blood lipids and cholesterol levels by exercising and minimizing your intake of saturated fat. Eat a balanced diet with plenty of fruit and vegetables. Blood tests for lipids and cholesterol should begin at age 70 and be repeated every 5 years. If your lipid or cholesterol levels are high, you are over 50, or  you are at high risk for heart disease, you may need your cholesterol levels checked more frequently. Ongoing high lipid and cholesterol levels should be treated with medicines if diet and exercise are not working.  . If you smoke, find out from your health care provider how to quit. If you do not use tobacco, please do not start.  . If you choose to drink alcohol, please do not consume more than 2 drinks per day. One drink is considered to be 12 ounces (355 mL) of beer, 5 ounces (148 mL) of wine, or 1.5 ounces (44 mL) of liquor.  . If you  are 46-56 years old, ask your health care provider if you should take aspirin to prevent strokes.  . Use sunscreen. Apply sunscreen liberally and repeatedly throughout the day. You should seek shade when your shadow is shorter than you. Protect yourself by wearing long sleeves, pants, a wide-brimmed hat, and sunglasses year round, whenever you are outdoors.  . Once a month, do a whole body skin exam, using a mirror to look at the skin on your back. Tell your health care provider of new moles, moles that have irregular borders, moles that are larger than a pencil eraser, or moles that have changed in shape or color.

## 2017-01-27 ENCOUNTER — Encounter: Payer: Self-pay | Admitting: Family Medicine

## 2017-01-27 ENCOUNTER — Ambulatory Visit: Payer: Medicare Other | Admitting: Family Medicine

## 2017-01-27 VITALS — BP 118/78 | HR 58 | Temp 98.4°F | Ht 75.0 in | Wt 217.6 lb

## 2017-01-27 DIAGNOSIS — L570 Actinic keratosis: Secondary | ICD-10-CM | POA: Diagnosis not present

## 2017-01-27 DIAGNOSIS — I10 Essential (primary) hypertension: Secondary | ICD-10-CM

## 2017-01-27 DIAGNOSIS — J449 Chronic obstructive pulmonary disease, unspecified: Secondary | ICD-10-CM

## 2017-01-27 DIAGNOSIS — E782 Mixed hyperlipidemia: Secondary | ICD-10-CM | POA: Diagnosis not present

## 2017-01-27 DIAGNOSIS — I482 Chronic atrial fibrillation, unspecified: Secondary | ICD-10-CM

## 2017-01-27 DIAGNOSIS — Z0001 Encounter for general adult medical examination with abnormal findings: Secondary | ICD-10-CM

## 2017-01-27 DIAGNOSIS — Z8739 Personal history of other diseases of the musculoskeletal system and connective tissue: Secondary | ICD-10-CM | POA: Diagnosis not present

## 2017-01-27 MED ORDER — ALLOPURINOL 300 MG PO TABS: 150.0000 mg | ORAL_TABLET | Freq: Every day | ORAL | 0 refills | Status: DC

## 2017-01-27 NOTE — Assessment & Plan Note (Signed)
At goal.  Continue lisinopril and metoprolol. 

## 2017-01-27 NOTE — Assessment & Plan Note (Signed)
Deferred cryotherapy today.  Patient has dermatologist.  Advised him to follow-up with them soon for cryotherapy for his AKs.

## 2017-01-27 NOTE — Assessment & Plan Note (Signed)
Stable. Continue allopurinol. 

## 2017-01-27 NOTE — Assessment & Plan Note (Signed)
LDL of 75 last week.  Continue Lipitor 20 mg 4 times weekly.

## 2017-01-27 NOTE — Progress Notes (Signed)
Subjective:  Reginald Green is a 72 y.o. male who presents today for his annual comprehensive physical exam.    HPI:  Reginald Green has no acute complaints today.   Lifestyle Diet: No specific diets. Cut out red meats. Likes vegetables. More pasta lately.  Exercise: Walk three times weekly. Two and a half miles each time. Daily core strengthening exercises to help with his low back pain.   Depression screen [facility] 2/9 01/24/2017  Decreased Interest 0  Down, Depressed, Hopeless 0  PHQ - 2 Score 0  Altered sleeping 0  Tired, decreased energy 0  Change in appetite 0  Feeling bad or failure about yourself  0  Trouble concentrating 0  Moving slowly or fidgety/restless 0  Suicidal thoughts 0  PHQ-9 Score 0  Difficult doing work/chores Not difficult at all    There are no preventive care reminders to display for this patient.     ROS: Per HPI, otherwise a 10 point review of systems was performed and was negative  PMH:  The following were reviewed and entered/updated in epic: Past Medical History:  Diagnosis Date  . Chronic atrial fibrillation (HCC)    Multiple failed cardioversions (even on flecanide). Normal LV function by echo in 2009 with LA 45mm, reported nonischemic myoview at that time  . COPD (chronic obstructive pulmonary disease) (HCC)   . Gilbert's disease   . Gout   . Hyperlipidemia   . Hypertension    Patient Active Problem List   Diagnosis Date Noted  . Actinic keratoses 01/27/2017  . Gilbert's disease   . COPD (chronic obstructive pulmonary disease) (HCC) 03/28/2015  . Encounter for therapeutic drug monitoring 02/26/2013  . Dizzy spells 03/14/2011  . Hypertension 01/29/2011  . RHINITIS 01/26/2010  . History of gout 01/23/2009  . Hyperlipidemia 01/23/2009  . Atrial fibrillation (HCC) 02/02/2007  . Asthma 02/02/2007  . 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 APPENDECTOMY      At time of hernia repair in 1979  . COLONOSCOPY  2011   neg, [facility], Due 2021   . FLEXIBLE SIGMOIDOSCOPY  05/12/1997  . HERNIA REPAIR     Undescended testicle brought down at this time in 1979  . TONSILLECTOMY      Family History  Problem Relation Age of Onset  . Stroke Mother 1  . Hypertension Mother   . Heart attack Father 38  . Heart attack Maternal Uncle 55  . Diabetes Maternal Grandmother   . Asthma Neg Hx   . COPD Neg Hx     Medications- reviewed and updated Current Outpatient Medications  Medication Sig Dispense Refill  . ADVAIR DISKUS 250-50 MCG/DOSE AEPB INHALE ONE PUFF BY MOUTH EVERY 12 HOURS 60 each 4  . allopurinol (ZYLOPRIM) 300 MG tablet Take 0.5 tablets (150 mg total) by mouth daily. 30 tablet 0  . atorvastatin (LIPITOR) 20 MG tablet Take 1 tablet (20 mg total) daily by mouth. (Patient taking differently: Take 20 mg by mouth 4 (four) times a week. ) 90 tablet 3  . fluticasone (FLONASE) 50 MCG/ACT nasal spray SHAKE GENTLY BEFORE USE AND INSTILL ONE SPRAY IN EACH NOSTRIL TWICE DAILY 48 g 3  . lisinopril (PRINIVIL,ZESTRIL) 20 MG tablet TAKE 1/2 (ONE-HALF) TABLET BY MOUTH DAILY 45 tablet 1  . metoprolol tartrate (LOPRESSOR) 25 MG tablet TAKE 1/2 TABLET BY MOUTH TWICE DAILY WITH FOOD 90 tablet 1  . Multiple Vitamin (MULTIVITAMIN) tablet  Take 1 tablet by mouth daily.      . rivaroxaban (XARELTO) 20 MG TABS tablet Take 1 tablet (20 mg total) by mouth daily with supper. 90 tablet 3   No current facility-administered medications for this visit.     Allergies-reviewed and updated No Known Allergies  Social History   Socioeconomic History  . Marital status: Married    Spouse name: None  . Number of children: 3  . Years of education: 6  . Highest education level: None  Social Needs  . Financial resource strain: None  . Food insecurity - worry: None  . Food insecurity - inability: None  . Transportation needs - medical: None  . Transportation needs -  non-medical: None  Occupational History  . Occupation: Retired   Tobacco Use  . Smoking status: Former Smoker    Packs/day: 2.00    Years: 22.00    Pack years: 44.00    Types: Cigarettes    Last attempt to quit: 02/01/1987    Years since quitting: 30.0  . Smokeless tobacco: Never Used  . Tobacco comment: smoked 1967-1989 , up to 2 ppd  Substance and Sexual Activity  . Alcohol use: Yes    Alcohol/week: 8.4 oz    Types: 14 Cans of beer per week  . Drug use: No  . Sexual activity: No  Other Topics Concern  . None  Social History Narrative   Fun: Work out in the yard, chase [employer].     Objective:  Physical Exam: BP 118/78 (BP Location: Left Arm, Patient Position: Sitting, Cuff Size: Normal)   Pulse (!) 58   Temp 98.4 F (36.9 C) (Oral)   Ht 6\' 3"  (1.905 m)   Wt 217 lb 9.6 oz (98.7 kg)   SpO2 94%   BMI 27.20 kg/m   Body mass index is 27.2 kg/m. Wt Readings from Last 3 Encounters:  01/27/17 217 lb 9.6 oz (98.7 kg)  01/24/17 216 lb 9.6 oz (98.2 kg)  11/20/16 215 lb 6.4 oz (97.7 kg)   Gen: NAD, resting comfortably HEENT: TMs normal bilaterally. OP clear. No thyromegaly noted.  CV: Irregular with no murmurs appreciated Pulm: NWOB, CTAB with no crackles, wheezes, or rhonchi GI: Normal bowel sounds present. Soft, Nontender, Nondistended. MSK: no edema, cyanosis, or clubbing noted Skin: warm, dry, numerous actinic keratoses noted on upper extremities bilaterally. Neuro: CN2-12 grossly intact. Strength 5/5 in upper and lower extremities. Reflexes symmetric and intact bilaterally.  Psych: Normal affect and thought content  Assessment/Plan:  Actinic keratoses Deferred cryotherapy today.  Patient has dermatologist.  Advised him to follow-up with them soon for cryotherapy for his AKs.  Atrial fibrillation (HCC) Stable.  Rate controlled.  On Anticoagulation.  Continue Xarelto and metoprolol.  Hypertension At goal.  Continue lisinopril and metoprolol.  COPD (chronic  obstructive pulmonary disease) (HCC) Stable.  Continue Advair.  History of gout Stable.  Continue allopurinol.  Hyperlipidemia LDL of 75 last week.  Continue Lipitor 20 mg 4 times weekly.  Patient Counseling:  -Nutrition: Stressed importance of moderation in sodium/caffeine intake, saturated fat and cholesterol, caloric balance, sufficient intake of fresh fruits, vegetables, and fiber.  -Stressed the importance of regular exercise.   -Substance Abuse: Discussed cessation/primary prevention of tobacco, alcohol, or other drug use; driving or other dangerous activities under the influence; availability of treatment for abuse.   -Injury prevention: Discussed safety belts, safety helmets, smoke detector, smoking near bedding or upholstery.   -Sexuality: Discussed sexually transmitted diseases, partner selection, use of  condoms, avoidance of unintended pregnancy and contraceptive alternatives.   -Dental health: Discussed importance of regular tooth brushing, flossing, and dental visits.  -Health maintenance and immunizations reviewed. Please refer to Health maintenance section.  Return to care in 1 year for next preventative visit.   Katina Degreealeb M. Jimmey RalphParker, MD 01/27/2017 9:15 AM

## 2017-01-27 NOTE — Assessment & Plan Note (Signed)
Stable.  Continue Advair. 

## 2017-01-27 NOTE — Assessment & Plan Note (Signed)
Stable.  Rate controlled.  On Anticoagulation.  Continue Xarelto and metoprolol.

## 2017-01-27 NOTE — Patient Instructions (Signed)
Preventive Care 70 Years and Older, Male Preventive care refers to lifestyle choices and visits with your health care provider that can promote health and wellness. What does preventive care include?  A yearly physical exam. This is also called an annual well check.  Dental exams once or twice a year.  Routine eye exams. Ask your health care provider how often you should have your eyes checked.  Personal lifestyle choices, including: ? Daily care of your teeth and gums. ? Regular physical activity. ? Eating a healthy diet. ? Avoiding tobacco and drug use. ? Limiting alcohol use. ? Practicing safe sex. ? Taking low doses of aspirin every day. ? Taking vitamin and mineral supplements as recommended by your health care provider. What happens during an annual well check? The services and screenings done by your health care provider during your annual well check will depend on your age, overall health, lifestyle risk factors, and family history of disease. Counseling Your health care provider may ask you questions about your:  Alcohol use.  Tobacco use.  Drug use.  Emotional well-being.  Home and relationship well-being.  Sexual activity.  Eating habits.  History of falls.  Memory and ability to understand (cognition).  Work and work environment.  Screening You may have the following tests or measurements:  Height, weight, and BMI.  Blood pressure.  Lipid and cholesterol levels. These may be checked every 5 years, or more frequently if you are over 50 years old.  Skin check.  Lung cancer screening. You may have this screening every year starting at age 55 if you have a 30-pack-year history of smoking and currently smoke or have quit within the past 15 years.  Fecal occult blood test (FOBT) of the stool. You may have this test every year starting at age 50.  Flexible sigmoidoscopy or colonoscopy. You may have a sigmoidoscopy every 5 years or a colonoscopy every 10  years starting at age 50.  Prostate cancer screening. Recommendations will vary depending on your family history and other risks.  Hepatitis C blood test.  Hepatitis B blood test.  Sexually transmitted disease (STD) testing.  Diabetes screening. This is done by checking your blood sugar (glucose) after you have not eaten for a while (fasting). You may have this done every 1-3 years.  Abdominal aortic aneurysm (AAA) screening. You may need this if you are a current or former smoker.  Osteoporosis. You may be screened starting at age 70 if you are at high risk.  Talk with your health care provider about your test results, treatment options, and if necessary, the need for more tests. Vaccines Your health care provider may recommend certain vaccines, such as:  Influenza vaccine. This is recommended every year.  Tetanus, diphtheria, and acellular pertussis (Tdap, Td) vaccine. You may need a Td booster every 10 years.  Varicella vaccine. You may need this if you have not been vaccinated.  Zoster vaccine. You may need this after age 60.  Measles, mumps, and rubella (MMR) vaccine. You may need at least one dose of MMR if you were born in 1957 or later. You may also need a second dose.  Pneumococcal 13-valent conjugate (PCV13) vaccine. One dose is recommended after age 65.  Pneumococcal polysaccharide (PPSV23) vaccine. One dose is recommended after age 65.  Meningococcal vaccine. You may need this if you have certain conditions.  Hepatitis A vaccine. You may need this if you have certain conditions or if you travel or work in places where you   may be exposed to hepatitis A.  Hepatitis B vaccine. You may need this if you have certain conditions or if you travel or work in places where you may be exposed to hepatitis B.  Haemophilus influenzae type b (Hib) vaccine. You may need this if you have certain risk factors.  Talk to your health care provider about which screenings and vaccines  you need and how often you need them. This information is not intended to replace advice given to you by your health care provider. Make sure you discuss any questions you have with your health care provider. Document Released: 01/20/2015 Document Revised: 09/13/2015 Document Reviewed: 10/25/2014 Elsevier Interactive Patient Education  2018 Elsevier Inc.  

## 2017-02-05 ENCOUNTER — Other Ambulatory Visit: Payer: Self-pay | Admitting: Cardiovascular Disease

## 2017-02-11 ENCOUNTER — Other Ambulatory Visit: Payer: Self-pay | Admitting: Cardiovascular Disease

## 2017-03-04 ENCOUNTER — Other Ambulatory Visit: Payer: Self-pay | Admitting: Family

## 2017-03-05 ENCOUNTER — Other Ambulatory Visit: Payer: Self-pay

## 2017-03-05 MED ORDER — FLUTICASONE-SALMETEROL 250-50 MCG/DOSE IN AEPB: INHALATION_SPRAY | RESPIRATORY_TRACT | 4 refills | Status: DC

## 2017-03-31 ENCOUNTER — Other Ambulatory Visit: Payer: Self-pay | Admitting: Family Medicine

## 2017-03-31 ENCOUNTER — Other Ambulatory Visit: Payer: Self-pay | Admitting: Cardiovascular Disease

## 2017-04-02 ENCOUNTER — Other Ambulatory Visit: Payer: Self-pay

## 2017-04-02 NOTE — Progress Notes (Unsigned)
ibuprofen

## 2017-04-03 ENCOUNTER — Other Ambulatory Visit: Payer: Self-pay

## 2017-04-03 MED ORDER — ALLOPURINOL 300 MG PO TABS: 300.0000 mg | ORAL_TABLET | Freq: Every day | ORAL | 3 refills | Status: DC

## 2017-04-03 NOTE — Telephone Encounter (Signed)
Rx was sent to pharmacy by Dr. Jimmey RalphParker.

## 2017-04-03 NOTE — Telephone Encounter (Signed)
OK to refill

## 2017-04-03 NOTE — Telephone Encounter (Signed)
Ok with me. Please place any necessary orders. 

## 2017-04-08 ENCOUNTER — Other Ambulatory Visit: Payer: Self-pay | Admitting: Family Medicine

## 2017-04-23 NOTE — Progress Notes (Signed)
Patient ID: Mee HivesJohn B Jaye, male   DOB: 07/08/1945, 72 y.o.   MRN: [id number]   72 y.o. history of chronic afib on Xarelto, HTN, HLD and COPD. 12/15/15 had left gluteal hematoma after trauma No neurologic issues when anticoagulation held. Last testing with echo in 2013 showed no evidence CAD and normal EF  No issues Enjoys following grand daughters softball games    [place] moving [place]   ROS: Denies fever, malais, weight loss, blurry vision, decreased visual acuity, cough, sputum, SOB, hemoptysis, pleuritic pain, palpitaitons, heartburn, abdominal pain, melena, lower extremity edema, claudication, or rash.  All other systems reviewed and negative  General: BP 128/80   Pulse (!) 59   Ht 6\' 3"  (1.905 m)   Wt 215 lb (97.5 kg)   SpO2 97%   BMI 26.87 kg/m  Affect appropriate Healthy:  appears stated age HEENT: normal Neck supple with no adenopathy JVP normal no bruits no thyromegaly Lungs clear with no wheezing and good diaphragmatic motion Heart:  S1/S2 no murmur, no rub, gallop or click PMI normal Abdomen: benighn, BS positve, no tenderness, no AAA no bruit.  No HSM or HJR Distal pulses intact with no bruits No edema Neuro non-focal Skin warm and dry No muscular weakness    Current Outpatient Medications  Medication Sig Dispense Refill  . allopurinol (ZYLOPRIM) 300 MG tablet Take 0.5 tablets (150 mg total) by mouth daily. 30 tablet 0  . atorvastatin (LIPITOR) 20 MG tablet Take 20 mg by mouth as directed.    . fluticasone (FLONASE) 50 MCG/ACT nasal spray SHAKE GENTLY BEFORE USE AND INSTILL ONE SPRAY IN EACH NOSTRIL TWICE DAILY 48 g 3  . Fluticasone-Salmeterol (ADVAIR) 250-50 MCG/DOSE AEPB INHALE ONE PUFF BY MOUTH EVERY 12 HOURS 180 each 3  . lisinopril (PRINIVIL,ZESTRIL) 20 MG tablet TAKE 1/2 (ONE-HALF) TABLET BY MOUTH DAILY 45 tablet 0  . metoprolol tartrate (LOPRESSOR) 25 MG tablet Take 0.5 tablets (12.5 mg total) by mouth 2 (two)  times daily. Please keep upcoming appt for future refills. Thank you 90 tablet 0  . Multiple Vitamin (MULTIVITAMIN) tablet Take 1 tablet by mouth daily.      . rivaroxaban (XARELTO) 20 MG TABS tablet Take 1 tablet (20 mg total) by mouth daily with supper. 90 tablet 3   No current facility-administered medications for this visit.     Allergies  Patient has no known allergies.  Electrocardiogram:  afib otherwise normal ECG 2014  2015  Afib rate 55 othewise normal   10/03/14  afib rate 44 no change  01/04/15 Afib rate 62 otherwise normal  04/22/16  Afib rate 61 normal otherwise   Assessment and Plan Chronic afib: good rate control On Xarelto 20  mg  Will get labs with primary in January   Hematoma:  Resolved related to trauma hopefully will not recur  No asa   HTN: Well controlled.  Continue current medications and low sodium Dash type diet.    Chol:   Lab Results  Component Value Date   LDLCALC 64 01/24/2017    COPD:  No active wheezing yearly CXR Advair    F/U with me in 6 months   Charlton HawsPeter Jacobe Study

## 2017-04-25 ENCOUNTER — Encounter: Payer: Self-pay | Admitting: Cardiovascular Disease

## 2017-04-25 ENCOUNTER — Ambulatory Visit: Payer: Medicare Other | Admitting: Cardiovascular Disease

## 2017-04-25 VITALS — BP 128/80 | HR 59 | Ht 75.0 in | Wt 215.0 lb

## 2017-04-25 DIAGNOSIS — I482 Chronic atrial fibrillation, unspecified: Secondary | ICD-10-CM

## 2017-04-25 DIAGNOSIS — I10 Essential (primary) hypertension: Secondary | ICD-10-CM | POA: Diagnosis not present

## 2017-04-25 NOTE — Addendum Note (Signed)
Addended by: Weber CooksOOPER, Kloe Oates on: 04/25/2017 04:45 PM   Modules accepted: Orders

## 2017-04-25 NOTE — Patient Instructions (Addendum)

## 2017-05-03 ENCOUNTER — Other Ambulatory Visit: Payer: Self-pay | Admitting: Cardiovascular Disease

## 2017-05-08 ENCOUNTER — Other Ambulatory Visit: Payer: Self-pay | Admitting: Cardiovascular Disease

## 2017-05-31 ENCOUNTER — Other Ambulatory Visit: Payer: Self-pay | Admitting: Cardiovascular Disease

## 2017-06-03 NOTE — Telephone Encounter (Signed)
Xarelto  refill request received; pt is 72 yrs old, wt-97.5kg, CrCl-1.04 on 11/14/17, last seen by Dr. Eden Emms on 04/25/17, CrCl-97.76ml/min; will send in refill to requested pharmacy.

## 2017-06-10 ENCOUNTER — Encounter: Payer: Self-pay | Admitting: Family Medicine

## 2017-06-11 ENCOUNTER — Other Ambulatory Visit: Payer: Self-pay | Admitting: Surgical

## 2017-08-01 ENCOUNTER — Encounter: Payer: Self-pay | Admitting: Family Medicine

## 2017-08-01 ENCOUNTER — Ambulatory Visit: Payer: Medicare Other | Admitting: Family Medicine

## 2017-08-01 ENCOUNTER — Ambulatory Visit: Payer: Self-pay

## 2017-08-01 VITALS — BP 140/90 | HR 70 | Temp 97.6°F | Ht 75.0 in | Wt 214.0 lb

## 2017-08-01 DIAGNOSIS — R42 Dizziness and giddiness: Secondary | ICD-10-CM | POA: Diagnosis not present

## 2017-08-01 DIAGNOSIS — I10 Essential (primary) hypertension: Secondary | ICD-10-CM | POA: Diagnosis not present

## 2017-08-01 NOTE — Assessment & Plan Note (Signed)
Symptoms suggest that it is positional. -Counseled on Epley maneuver -If no improvement need to try physical therapy for meclizine.

## 2017-08-01 NOTE — Patient Instructions (Signed)
Nice to meet you  Please try to stay well hydrated.  Rise from seated to standing slowly   How to Perform the Epley Maneuver The Epley maneuver is an exercise that relieves symptoms of vertigo. Vertigo is the feeling that you or your surroundings are moving when they are not. When you feel vertigo, you may feel like the room is spinning and have trouble walking. Dizziness is a little different than vertigo. When you are dizzy, you may feel unsteady or light-headed. You can do this maneuver at home whenever you have symptoms of vertigo. You can do it up to 3 times a day until your symptoms go away. Even though the Epley maneuver may relieve your vertigo for a few weeks, it is possible that your symptoms will return. This maneuver relieves vertigo, but it does not relieve dizziness. What are the risks? If it is done correctly, the Epley maneuver is considered safe. Sometimes it can lead to dizziness or nausea that goes away after a short time. If you develop other symptoms, such as changes in vision, weakness, or numbness, stop doing the maneuver and call your health care provider. How to perform the Epley maneuver 1. Sit on the edge of a bed or table with your back straight and your legs extended or hanging over the edge of the bed or table. 2. Turn your head halfway toward the affected ear or side. 3. Lie backward quickly with your head turned until you are lying flat on your back. You may want to position a pillow under your shoulders. 4. Hold this position for 30 seconds. You may experience an attack of vertigo. This is normal. 5. Turn your head to the opposite direction until your unaffected ear is facing the floor. 6. Hold this position for 30 seconds. You may experience an attack of vertigo. This is normal. Hold this position until the vertigo stops. 7. Turn your whole body to the same side as your head. Hold for another 30 seconds. 8. Sit back up. You can repeat this exercise up to 3 times a  day. Follow these instructions at home:  After doing the Epley maneuver, you can return to your normal activities.  Ask your health care provider if there is anything you should do at home to prevent vertigo. He or she may recommend that you: ? Keep your head raised (elevated) with two or more pillows while you sleep. ? Do not sleep on the side of your affected ear. ? Get up slowly from bed. ? Avoid sudden movements during the day. ? Avoid extreme head movement, like looking up or bending over. Contact a health care provider if:  Your vertigo gets worse.  You have other symptoms, including: ? Nausea. ? Vomiting. ? Headache. Get help right away if:  You have vision changes.  You have a severe or worsening headache or neck pain.  You cannot stop vomiting.  You have new numbness or weakness in any part of your body. Summary  Vertigo is the feeling that you or your surroundings are moving when they are not.  The Epley maneuver is an exercise that relieves symptoms of vertigo.  If the Epley maneuver is done correctly, it is considered safe. You can do it up to 3 times a day. This information is not intended to replace advice given to you by your health care provider. Make sure you discuss any questions you have with your health care provider. Document Released: 12/29/2012 Document Revised: 11/14/2015 Document Reviewed:  11/14/2015 Elsevier Interactive Patient Education  2017 [employer]

## 2017-08-01 NOTE — Telephone Encounter (Signed)
Phone call from pt.  C/o intermittent light headed feeling x 2 days.  Stated he feels unsteady and the room is spinning at times. C/o nausea.  Denied chest pain, sweating, visual disturbance, weakness/ numbness of extremities, disorientation, or speech difficulty.  Reported his right ear "pops" intermittently with swallowing. Stated he has very small eustachian tubes, and has had problems with inner ear disturbance in past.  Reported hx of Atrial Fib. BP checked @ 8:30 7/25: 129/80, pulse 62.  BP 163/110, pulse 70 at 10:37 AM today; BP 150/108, pulse 67 at 10:43 AM today.  Appt. Sched. For today at 2:00 PM with [facility], since his PCP office does not have availability.  Care advice given per protocol; verb. understanding.       Reason for Disposition . [1] MODERATE dizziness (e.g., interferes with normal activities) AND [2] has NOT been evaluated by physician for this  (Exception: dizziness caused by heat exposure, sudden standing, or poor fluid intake)  Answer Assessment - Initial Assessment Questions 1. DESCRIPTION: "Describe your dizziness."     Felt unsteady; room started spinning when laid head on pillow 2. LIGHTHEADED: "Do you feel lightheaded?" (e.g., somewhat faint, woozy, weak upon standing)     Light headed; comes and goes;  denied feeling faint  3. VERTIGO: "Do you feel like either you or the room is spinning or tilting?" (i.e. vertigo)    Room spinning 4. SEVERITY: "How bad is it?"  "Do you feel like you are going to faint?" "Can you stand and walk?"   - MILD - walking normally   - MODERATE - interferes with normal activities (e.g., work, school)    - SEVERE - unable to stand, requires support to walk, feels like passing out now.     Mild to moderate 5. ONSET:  "When did the dizziness begin?"     2 days 6. AGGRAVATING FACTORS: "Does anything make it worse?" (e.g., standing, change in head position)    Change in head position or bending over  7. HEART RATE: "Can you tell me  your heart rate?" "How many beats in 15 seconds?"  (Note: not all patients can do this)       BP 129/80, pulse 62 @ 8:30 PM 7/25;  BP 163/110, pulse 70 @ 10:37 AM 7/26 8. CAUSE: "What do you think is causing the dizziness?"     Inner ear congestion 9. RECURRENT SYMPTOM: "Have you had dizziness before?" If so, ask: "When was the last time?" "What happened that time?"    Previous episodes of inner ear fluid 10. OTHER SYMPTOMS: "Do you have any other symptoms?" (e.g., fever, chest pain, vomiting, diarrhea, bleeding)       C/o nausea; c/o headache over eyes; denied chest pain or sweating; Denied vision disturbance, speech problem, disorientation, weakness or numbness of extremities.  Protocols used: DIZZINESS [place]- LIGHTHEADEDNESS-A-AH

## 2017-08-01 NOTE — Assessment & Plan Note (Signed)
Fairly controlled.   - Counseled on diet and exercise. - Continue medications

## 2017-08-01 NOTE — Progress Notes (Signed)
Reginald Green - 72 y.o. male MRN [id number]  Date of birth: 07/23/1945  SUBJECTIVE:  Including CC & ROS.  No chief complaint on file.   Reginald Green is a 72 y.o. male that is presenting with vertigo and hypertension.  Reports the vertigo has been occurring over the past few nights.  He will turn his foot over in bed and feels the room is spinning.  He has history of ear problems and having tubes in the past.  He feels like he can get his right ear to pop from time to time.  He denies any recent illnesses.  Denies having any new or medication changes.  Has otherwise had the same routine.  Is headed to the beach this next week.  He feels like his blood pressures been slightly elevated over the past few days.  Has been taking medications as usual.  Denies any chest pain or shortness of breath.  Denies any leg swelling.    Review of Systems  Constitutional: Negative for fever.  HENT: Negative for congestion.   Respiratory: Negative for cough.   Cardiovascular: Negative for chest pain.  Neurological: Positive for dizziness.  Hematological: Negative for adenopathy.    HISTORY: Past Medical, Surgical, Social, and Family History Reviewed & Updated per EMR.   Pertinent Historical Findings include:  Past Medical History:  Diagnosis Date  . Chronic atrial fibrillation (HCC)    Multiple failed cardioversions (even on flecanide). Normal LV function by echo in 2009 with LA 45mm, reported nonischemic myoview at that time  . COPD (chronic obstructive pulmonary disease) (HCC)   . Gilbert's disease   . Gout   . Hyperlipidemia   . Hypertension     Past Surgical History:  Procedure Laterality Date  . APPENDECTOMY     At time of hernia repair in 1979  . COLONOSCOPY  2011   neg, [facility], Due 2021   . FLEXIBLE SIGMOIDOSCOPY  05/12/1997  . HERNIA REPAIR     Undescended testicle brought down at this time in 1979  . TONSILLECTOMY      No Known Allergies  Family History  Problem  Relation Age of Onset  . Stroke Mother [id number]  . Hypertension Mother   . Heart attack Father 275  . Heart attack Maternal Uncle 55  . Diabetes Maternal Grandmother   . Asthma Neg Hx   . COPD Neg Hx      Social History   Socioeconomic History  . Marital status: Married    Spouse name: Not on file  . Number of children: 3  . Years of education: 3916  . Highest education level: Not on file  Occupational History  . Occupation: Retired   Engineer, productionocial Needs  . Financial resource strain: Not on file  . Food insecurity:    Worry: Not on file    Inability: Not on file  . Transportation needs:    Medical: Not on file    Non-medical: Not on file  Tobacco Use  . Smoking status: Former Smoker    Packs/day: 2.00    Years: 22.00    Pack years: 44.00    Types: Cigarettes    Last attempt to quit: 02/01/1987    Years since quitting: 30.5  . Smokeless tobacco: Never Used  . Tobacco comment: smoked 1967-1989 , up to 2 ppd  Substance and Sexual Activity  . Alcohol use: Yes    Alcohol/week: 8.4 oz    Types: 14 Cans of beer per week  .  Drug use: No  . Sexual activity: Never  Lifestyle  . Physical activity:    Days per week: Not on file    Minutes per session: Not on file  . Stress: Not on file  Relationships  . Social connections:    Talks on phone: Not on file    Gets together: Not on file    Attends religious service: Not on file    Active member of club or organization: Not on file    Attends meetings of clubs or organizations: Not on file    Relationship status: Not on file  . Intimate partner violence:    Fear of current or ex partner: Not on file    Emotionally abused: Not on file    Physically abused: Not on file    Forced sexual activity: Not on file  Other Topics Concern  . Not on file  Social History Narrative   Fun: Work out in the yard, chase [employer].      PHYSICAL EXAM:  VS: BP 140/90 (BP Location: Left Arm, Patient Position: Sitting, Cuff Size: Large)   Pulse 70    Temp 97.6 F (36.4 C) (Oral)   Ht 6\' 3"  (1.905 m)   Wt 214 lb (97.1 kg)   SpO2 97%   BMI 26.75 kg/m  Physical Exam Gen: NAD, alert, cooperative with exam, well-appearing ENT: normal lips, normal nasal mucosa,  Eye: normal EOM, normal conjunctiva and lids CV:  no edema, +2 pedal pulses, irregularly irregular Resp: no accessory muscle use, non-labored, clear to auscultation bilaterally Skin: no rashes, no areas of induration  Neuro: normal tone, normal sensation to touch Psych:  normal insight, alert and oriented MSK: normal gait, normal strength       ASSESSMENT & PLAN:   Hypertension Fairly controlled.   - Counseled on diet and exercise. - Continue medications  Vertigo Symptoms suggest that it is positional. -Counseled on Epley maneuver -If no improvement need to try physical therapy for meclizine.

## 2018-01-22 ENCOUNTER — Encounter: Payer: Self-pay | Admitting: Family Medicine

## 2018-01-28 ENCOUNTER — Ambulatory Visit (INDEPENDENT_AMBULATORY_CARE_PROVIDER_SITE_OTHER): Payer: Medicare Other | Admitting: Family Medicine

## 2018-01-28 ENCOUNTER — Encounter: Payer: Self-pay | Admitting: Family Medicine

## 2018-01-28 VITALS — BP 116/68 | HR 65 | Temp 97.5°F | Ht 73.0 in | Wt 215.2 lb

## 2018-01-28 DIAGNOSIS — Z8739 Personal history of other diseases of the musculoskeletal system and connective tissue: Secondary | ICD-10-CM

## 2018-01-28 DIAGNOSIS — J449 Chronic obstructive pulmonary disease, unspecified: Secondary | ICD-10-CM | POA: Diagnosis not present

## 2018-01-28 DIAGNOSIS — N401 Enlarged prostate with lower urinary tract symptoms: Secondary | ICD-10-CM

## 2018-01-28 DIAGNOSIS — I10 Essential (primary) hypertension: Secondary | ICD-10-CM | POA: Diagnosis not present

## 2018-01-28 DIAGNOSIS — E782 Mixed hyperlipidemia: Secondary | ICD-10-CM | POA: Diagnosis not present

## 2018-01-28 DIAGNOSIS — Z125 Encounter for screening for malignant neoplasm of prostate: Secondary | ICD-10-CM | POA: Diagnosis not present

## 2018-01-28 DIAGNOSIS — Z0001 Encounter for general adult medical examination with abnormal findings: Secondary | ICD-10-CM

## 2018-01-28 DIAGNOSIS — I4891 Unspecified atrial fibrillation: Secondary | ICD-10-CM

## 2018-01-28 DIAGNOSIS — J329 Chronic sinusitis, unspecified: Secondary | ICD-10-CM

## 2018-01-28 DIAGNOSIS — N138 Other obstructive and reflux uropathy: Secondary | ICD-10-CM

## 2018-01-28 LAB — COMPREHENSIVE METABOLIC PANEL
ALT: 34 U/L (ref 0–53)
AST: 31 U/L (ref 0–37)
Albumin: 4 g/dL (ref 3.5–5.2)
Alkaline Phosphatase: 60 U/L (ref 39–117)
BUN: 17 mg/dL (ref 6–23)
CALCIUM: 9.8 mg/dL (ref 8.4–10.5)
CO2: 29 mEq/L (ref 19–32)
Chloride: 97 mEq/L (ref 96–112)
Creatinine, Ser: 0.94 mg/dL (ref 0.40–1.50)
GFR: 79.14 mL/min (ref >60.00)
Glucose, Bld: 97 mg/dL (ref 70–99)
Potassium: 4.6 mEq/L (ref 3.5–5.1)
Sodium: 133 mEq/L — ABNORMAL LOW (ref 135–145)
Total Bilirubin: 1.1 mg/dL (ref 0.2–1.2)
Total Protein: 7.1 g/dL (ref 6.0–8.3)

## 2018-01-28 LAB — CBC
HCT: 43.1 % (ref 39.0–52.0)
Hemoglobin: 15 g/dL (ref 13.0–17.0)
MCHC: 34.9 g/dL (ref 30.0–36.0)
MCV: 99.3 fl (ref 78.0–100.0)
Platelets: 261 10*3/uL (ref 150.0–400.0)
RBC: 4.34 Mil/uL (ref 4.22–5.81)
RDW: 11.9 % (ref 11.5–15.5)
WBC: 9.9 10*3/uL (ref 4.0–10.5)

## 2018-01-28 LAB — LIPID PANEL
CHOLESTEROL: 132 mg/dL (ref 0–200)
HDL: 51.1 mg/dL (ref >39.00)
LDL CALC: 61 mg/dL (ref 0–99)
NonHDL: 81.29
Total CHOL/HDL Ratio: 3
Triglycerides: 102 mg/dL (ref 0.0–149.0)
VLDL: 20.4 mg/dL (ref 0.0–40.0)

## 2018-01-28 LAB — PSA, MEDICARE: PSA: 0.19 ng/ml (ref 0.10–4.00)

## 2018-01-28 LAB — TSH: TSH: 1.08 u[IU]/mL (ref 0.35–4.50)

## 2018-01-28 MED ORDER — IPRATROPIUM BROMIDE 0.06 % NA SOLN: 2.0000 | Freq: Four times a day (QID) | NASAL | 0 refills | Status: DC

## 2018-01-28 MED ORDER — AMOXICILLIN-POT CLAVULANATE 875-125 MG PO TABS: 1.0000 | ORAL_TABLET | Freq: Two times a day (BID) | ORAL | 0 refills | Status: DC

## 2018-01-28 NOTE — Progress Notes (Signed)
Subjective:  Reginald Green is a 73 y.o. male who presents today for his annual comprehensive physical exam.    HPI:  He has 1 complaints today:  Sinusitis.  He has been dealing with an upper respiratory infection for the past week or so.  Over the last few days he has noticed worsening pain and pressure to his sinuses.  He had some associated sore throat, cough, and postnasal drip.  He has had several family members sick with similar symptoms.  No specific treatments tried.  No other obvious alleviating or aggravating factors.  His stable, chronic medical conditions are outlined below:  # HTN - Currently on lisinopril 10mg  daily and metoprolol tartrate 12.5mg  twice daily and tolerating well - No chest pain or shortness of breath  # HLD -Currently on Lipitor 20 mg 4 times weekly and tolerating well. - No myalgias.  # BPH - Not currently on any medications - Wakes up a few times per night  # Gout - Currently on allopurinol 150mg  daily and tolerating well  # COPD - Currently on advair and tolerating well  % Afib - Follows with cardiology - Rate controlled on metoprolol tartrate 12.5mg  twice daily - Anticoagulated on xarelto 20mg  daily  Lifestyle Diet: No specific diets.  Tries to eat a healthy and balanced diet. Exercise: Walks 3 times weekly for about 2-1/2 miles each time.  Depression screen [facility] 2/9 01/24/2017  Decreased Interest 0  Down, Depressed, Hopeless 0  PHQ - 2 Score 0  Altered sleeping 0  Tired, decreased energy 0  Change in appetite 0  Feeling bad or failure about yourself  0  Trouble concentrating 0  Moving slowly or fidgety/restless 0  Suicidal thoughts 0  PHQ-9 Score 0  Difficult doing work/chores Not difficult at all    There are no preventive care reminders to display for this patient.   ROS: Per HPI, otherwise a complete review of systems was negative.   PMH:  The following were reviewed and entered/updated in epic: Past Medical  History:  Diagnosis Date  . Chronic atrial fibrillation    Multiple failed cardioversions (even on flecanide). Normal LV function by echo in 2009 with LA 61mm, reported nonischemic myoview at that time  . COPD (chronic obstructive pulmonary disease) (HCC)   . Gilbert's disease   . Gout   . Hyperlipidemia   . Hypertension    Patient Active Problem List   Diagnosis Date Noted  . Actinic keratoses 01/27/2017  . Gilbert's disease   . COPD (chronic obstructive pulmonary disease) (HCC) 03/28/2015  . Encounter for therapeutic drug monitoring 02/26/2013  . Hypertension 01/29/2011  . Vertigo 01/10/2011  . History of gout 01/23/2009  . Hyperlipidemia 01/23/2009  . Atrial fibrillation (HCC) 02/02/2007  . Asthma 02/02/2007  . 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 APPENDECTOMY     At time of hernia repair in 1979  . COLONOSCOPY  2011   neg, [facility], Due 2021   . FLEXIBLE SIGMOIDOSCOPY  05/12/1997  . HERNIA REPAIR     Undescended testicle brought down at this time in 1979  . TONSILLECTOMY      Family History  Problem Relation Age of Onset  . Stroke Mother 34  . Hypertension Mother   . Heart attack Father 39  . Heart attack Maternal Uncle 55  . Diabetes Maternal Grandmother   . Asthma Neg Hx   . COPD Neg Hx  Medications- reviewed and updated Current Outpatient Medications  Medication Sig Dispense Refill  . allopurinol (ZYLOPRIM) 300 MG tablet Take 0.5 tablets (150 mg total) by mouth daily. 30 tablet 0  . atorvastatin (LIPITOR) 20 MG tablet Take 20 mg by mouth as directed.    . fluticasone (FLONASE) 50 MCG/ACT nasal spray SHAKE GENTLY BEFORE USE AND INSTILL ONE SPRAY IN EACH NOSTRIL TWICE DAILY 48 g 3  . Fluticasone-Salmeterol (ADVAIR) 250-50 MCG/DOSE AEPB INHALE ONE PUFF BY MOUTH EVERY 12 HOURS 180 each 3  . lisinopril (PRINIVIL,ZESTRIL) 20 MG tablet Take 0.5 tablets (10 mg total) by mouth daily.  45 tablet 3  . metoprolol tartrate (LOPRESSOR) 25 MG tablet Take 0.5 tablets (12.5 mg total) by mouth 2 (two) times daily. 90 tablet 3  . Multiple Vitamin (MULTIVITAMIN) tablet Take 1 tablet by mouth daily.      [name]. XARELTO 20 MG TABS tablet TAKE ONE TABLET BY MOUTH DAILY WITH SUPPER 90 tablet 2  . amoxicillin-clavulanate (AUGMENTIN) 875-125 MG tablet Take 1 tablet by mouth 2 (two) times daily. 14 tablet 0  . ipratropium (ATROVENT) 0.06 % nasal spray Place 2 sprays into both nostrils 4 (four) times daily. 15 mL 0   No current facility-administered medications for this visit.     Allergies-reviewed and updated No Known Allergies  Social History   Socioeconomic History  . Marital status: Married    Spouse name: Not on file  . Number of children: 3  . Years of education: 5016  . Highest education level: Not on file  Occupational History  . Occupation: Retired   Engineer, productionocial Needs  . Financial resource strain: Not on file  . Food insecurity:    Worry: Not on file    Inability: Not on file  . Transportation needs:    Medical: Not on file    Non-medical: Not on file  Tobacco Use  . Smoking status: Former Smoker    Packs/day: 2.00    Years: 22.00    Pack years: 44.00    Types: Cigarettes    Last attempt to quit: 02/01/1987    Years since quitting: 31.0  . Smokeless tobacco: Never Used  . Tobacco comment: smoked 1967-1989 , up to 2 ppd  Substance and Sexual Activity  . Alcohol use: Yes    Alcohol/week: 14.0 standard drinks    Types: 14 Cans of beer per week  . Drug use: No  . Sexual activity: Never  Lifestyle  . Physical activity:    Days per week: Not on file    Minutes per session: Not on file  . Stress: Not on file  Relationships  . Social connections:    Talks on phone: Not on file    Gets together: Not on file    Attends religious service: Not on file    Active member of club or organization: Not on file    Attends meetings of clubs or organizations: Not on file     Relationship status: Not on file  Other Topics Concern  . Not on file  Social History Narrative   Fun: Work out in the yard, chase [employer]children.     Objective:  Physical Exam: BP 116/68 (BP Location: Left Arm, Patient Position: Sitting, Cuff Size: Normal)   Pulse 65   Temp (!) 97.5 F (36.4 C) (Oral)   Ht 6\' 1"  (1.854 m)   Wt 215 lb 3.2 oz (97.6 kg)   SpO2 98%   BMI 28.39 kg/m   Body mass index  is 28.39 kg/m. Wt Readings from Last 3 Encounters:  01/28/18 215 lb 3.2 oz (97.6 kg)  08/01/17 214 lb (97.1 kg)  04/25/17 215 lb (97.5 kg)   Gen: NAD, resting comfortably HEENT: TMs with clear effusion.  Oropharynx erythematous with no exudate. CV: RRR with no murmurs appreciated Pulm: NWOB, CTAB with no crackles, wheezes, or rhonchi GI: Normal bowel sounds present. Soft, Nontender, Nondistended. MSK: no edema, cyanosis, or clubbing noted Skin: warm, dry Neuro: CN2-12 grossly intact. Strength 5/5 in upper and lower extremities. Reflexes symmetric and intact bilaterally.  Psych: Normal affect and thought content  Assessment/Plan:  Hyperlipidemia Continue Lipitor 20 mg 4 times weekly.  Check lipid panel today.  History of gout Stable.  Continue allopurinol 150 mg daily.  Hypertension At goal.  Continue lisinopril 10 mg daily and metoprolol tartrate 12.5 mg daily.  COPD (chronic obstructive pulmonary disease) (HCC) Stable.  Continue Advair.  BPH with obstruction/lower urinary tract symptoms Discussed behavioral modifications.  Declined pharmacotherapy for the time being.  Atrial fibrillation (HCC) Stable.  Rate controlled on metoprolol and anticoagulated on Xarelto.  Sinusitis Given the symptoms have been persistent for the past week and are worsening, we will start doxycycline and Atrovent nasal spray.  Recommended over-the-counter analgesics such as ibuprofen and Tylenol as needed.  Recommended good oral hydration.  Discussed reasons return to care and seek emergent  care.  Preventative Healthcare: Check PSA with blood work.  Patient Counseling(The following topics were reviewed and/or handout was given):  -Nutrition: Stressed importance of moderation in sodium/caffeine intake, saturated fat and cholesterol, caloric balance, sufficient intake of fresh fruits, vegetables, and fiber.  -Stressed the importance of regular exercise.   -Substance Abuse: Discussed cessation/primary prevention of tobacco, alcohol, or other drug use; driving or other dangerous activities under the influence; availability of treatment for abuse.   -Injury prevention: Discussed safety belts, safety helmets, smoke detector, smoking near bedding or upholstery.   -Sexuality: Discussed sexually transmitted diseases, partner selection, use of condoms, avoidance of unintended pregnancy and contraceptive alternatives.   -Dental health: Discussed importance of regular tooth brushing, flossing, and dental visits.  -Health maintenance and immunizations reviewed. Please refer to Health maintenance section.  Return to care in 1 year for next preventative visit.   Katina Degree. Jimmey Ralph, MD 01/28/2018 12:06 PM

## 2018-01-28 NOTE — Assessment & Plan Note (Signed)
Stable.  Continue allopurinol 150 mg daily.

## 2018-01-28 NOTE — Patient Instructions (Addendum)
It was very nice to see you today!  Start the Augmentin and Atrovent.  Please stay well-hydrated.  Let me know if your symptoms not improve in the next 2 days.  We will check blood work today.  Please continue staying active and eating a healthy diet.  Come back to see me in 1 year for your next physical, or sooner as needed.  Take care, Dr Reginald Green   Preventive Care 71 Years and Older, Male Preventive care refers to lifestyle choices and visits with your health care provider that can promote health and wellness. What does preventive care include?   A yearly physical exam. This is also called an annual well check.  Dental exams once or twice a year.  Routine eye exams. Ask your health care provider how often you should have your eyes checked.  Personal lifestyle choices, including: ? Daily care of your teeth and gums. ? Regular physical activity. ? Eating a healthy diet. ? Avoiding tobacco and drug use. ? Limiting alcohol use. ? Practicing safe sex. ? Taking low doses of aspirin every day. ? Taking vitamin and mineral supplements as recommended by your health care provider. What happens during an annual well check? The services and screenings done by your health care provider during your annual well check will depend on your age, overall health, lifestyle risk factors, and family history of disease. Counseling Your health care provider may ask you questions about your:  Alcohol use.  Tobacco use.  Drug use.  Emotional well-being.  Home and relationship well-being.  Sexual activity.  Eating habits.  History of falls.  Memory and ability to understand (cognition).  Work and work Statistician. Screening You may have the following tests or measurements:  Height, weight, and BMI.  Blood pressure.  Lipid and cholesterol levels. These may be checked every 5 years, or more frequently if you are over 71 years old.  Skin check.  Lung cancer screening. You  may have this screening every year starting at age 71 if you have a 30-pack-year history of smoking and currently smoke or have quit within the past 15 years.  Colorectal cancer screening. All adults should have this screening starting at age 71 and continuing until age 71. You will have tests every 1-10 years, depending on your results and the type of screening test. People at increased risk should start screening at an earlier age. Screening tests may include: ? Guaiac-based fecal occult blood testing. ? Fecal immunochemical test (FIT). ? Stool DNA test. ? Virtual colonoscopy. ? Sigmoidoscopy. During this test, a flexible tube with a tiny camera (sigmoidoscope) is used to examine your rectum and lower colon. The sigmoidoscope is inserted through your anus into your rectum and lower colon. ? Colonoscopy. During this test, a long, thin, flexible tube with a tiny camera (colonoscope) is used to examine your entire colon and rectum.  Prostate cancer screening. Recommendations will vary depending on your family history and other risks.  Hepatitis C blood test.  Hepatitis B blood test.  Sexually transmitted disease (STD) testing.  Diabetes screening. This is done by checking your blood sugar (glucose) after you have not eaten for a while (fasting). You may have this done every 1-3 years.  Abdominal aortic aneurysm (AAA) screening. You may need this if you are a current or former smoker.  Osteoporosis. You may be screened starting at age 71 if you are at high risk. Talk with your health care provider about your test results, treatment options, and if  necessary, the need for more tests. Vaccines Your health care provider may recommend certain vaccines, such as:  Influenza vaccine. This is recommended every year.  Tetanus, diphtheria, and acellular pertussis (Tdap, Td) vaccine. You may need a Td booster every 10 years.  Varicella vaccine. You may need this if you have not been  vaccinated.  Zoster vaccine. You may need this after age 71.  Measles, mumps, and rubella (MMR) vaccine. You may need at least one dose of MMR if you were born in 1957 or later. You may also need a second dose.  Pneumococcal 13-valent conjugate (PCV13) vaccine. One dose is recommended after age 71.  Pneumococcal polysaccharide (PPSV23) vaccine. One dose is recommended after age 71.  Meningococcal vaccine. You may need this if you have certain conditions.  Hepatitis A vaccine. You may need this if you have certain conditions or if you travel or work in places where you may be exposed to hepatitis A.  Hepatitis B vaccine. You may need this if you have certain conditions or if you travel or work in places where you may be exposed to hepatitis B.  Haemophilus influenzae type b (Hib) vaccine. You may need this if you have certain risk factors. Talk to your health care provider about which screenings and vaccines you need and how often you need them. This information is not intended to replace advice given to you by your health care provider. Make sure you discuss any questions you have with your health care provider. Document Released: 01/20/2015 Document Revised: 02/13/2017 Document Reviewed: 10/25/2014 Elsevier Interactive Patient Education  2019 [employer].

## 2018-01-28 NOTE — Assessment & Plan Note (Signed)
Stable.  Continue Advair. 

## 2018-01-28 NOTE — Assessment & Plan Note (Signed)
Discussed behavioral modifications.  Declined pharmacotherapy for the time being.

## 2018-01-28 NOTE — Assessment & Plan Note (Signed)
At goal.  Continue lisinopril 10 mg daily and metoprolol tartrate 12.5 mg daily.

## 2018-01-28 NOTE — Assessment & Plan Note (Signed)
Stable.  Rate controlled on metoprolol and anticoagulated on Xarelto. 

## 2018-01-28 NOTE — Assessment & Plan Note (Signed)
Continue Lipitor 20 mg 4 times weekly.  Check lipid panel today.

## 2018-01-30 NOTE — Progress Notes (Signed)
Dr Lavone Neri interpretation of your lab work:  Good news! Your blood work is all normal including your cholesterol, blood sugar, and prostate levels.  Keep up the good work and we can recheck in 1 year.   If you have any additional questions, please give [place] a call or send [place] a message through [place].  Take care, Dr Jimmey Ralph

## 2018-02-23 ENCOUNTER — Other Ambulatory Visit: Payer: Self-pay | Admitting: Cardiovascular Disease

## 2018-02-24 ENCOUNTER — Other Ambulatory Visit: Payer: Self-pay | Admitting: Family Medicine

## 2018-02-27 ENCOUNTER — Other Ambulatory Visit: Payer: Self-pay | Admitting: Cardiovascular Disease

## 2018-03-03 ENCOUNTER — Telehealth: Payer: Self-pay | Admitting: Family Medicine

## 2018-03-03 ENCOUNTER — Other Ambulatory Visit: Payer: Self-pay | Admitting: Family Medicine

## 2018-03-03 ENCOUNTER — Other Ambulatory Visit: Payer: Self-pay

## 2018-03-03 MED ORDER — ATORVASTATIN CALCIUM 20 MG PO TABS: ORAL_TABLET | ORAL | 2 refills | Status: DC

## 2018-03-03 NOTE — Telephone Encounter (Signed)
Please advise.  Ok to update Rx?

## 2018-03-03 NOTE — Telephone Encounter (Signed)
Ok with me. Please place any necessary orders. 

## 2018-03-03 NOTE — Telephone Encounter (Signed)
Copied from CRM [id number]. Topic: General - Other >> Mar 03, 2018 10:53 AM Herby Abraham C wrote: Reason for CRM: pharmacy called in to make provider aware that pt only takes medication atorvastatin (LIPITOR) 20 MG tablet 3 days out the week instead of everyday as prescribed. Pharmacy stated that pt made them aware that when taking medication everyday it causes him to have muscle cramping.   Pharmacy is requesting an update Rx reflecting this.

## 2018-03-03 NOTE — Telephone Encounter (Signed)
Updated Rx sent to pharmacy

## 2018-03-10 ENCOUNTER — Other Ambulatory Visit: Payer: Self-pay

## 2018-03-10 MED ORDER — ATORVASTATIN CALCIUM 20 MG PO TABS: ORAL_TABLET | ORAL | 2 refills | Status: DC

## 2018-04-23 NOTE — Progress Notes (Signed)
Virtual Visit via Video Note   This visit type was conducted due to national recommendations for restrictions regarding the COVID-19 Pandemic (e.g. social distancing) in an effort to limit this patient's exposure and mitigate transmission in our community.  Due to his co-morbid illnesses, this patient is at least at moderate risk for complications without adequate follow up.  This format is felt to be most appropriate for this patient at this time.  All issues noted in this document were discussed and addressed.  A limited physical exam was performed with this format.  Please refer to the patient's chart for his consent to telehealth for [facility].   Evaluation Performed:  Follow-up visit  Date:  04/27/2018   ID:  Reginald Green, DOB 04/30/1945, MRN [id number]  Patient Location: Home Provider Location: Office  PCP:  Ardith DarkParker, Caleb M, MD  Cardiologist:  Eden EmmsNishan   Electrophysiologist:  None   Chief Complaint:  Afib  History of Present Illness:    73 y.o. history of chronic afib on Xarelto, HTN, HLD and COPD. 12/15/15 had left gluteal hematoma after trauma No neurologic issues when anticoagulation held. Last testing with echo/myovue in 2013 showed no evidence CAD and normal EF  No issues Enjoys following grand daughters softball games   [place] moved  [place] from [place] so [place] go to [place]  But she ended up at [employer]  Labs reviewed from 01/28/18 and counts/lytes / LDL all normal   The patient does not have symptoms concerning for COVID-19 infection (fever, chills, cough, or new shortness of breath).    Past Medical History:  Diagnosis Date  . Chronic atrial fibrillation    Multiple failed cardioversions (even on flecanide). Normal LV function by echo in 2009 with LA 45mm, reported nonischemic myoview at that time  . COPD (chronic obstructive pulmonary disease) (HCC)   . Gilbert's disease   . Gout   . Hyperlipidemia   . Hypertension    Past Surgical  History:  Procedure Laterality Date  . APPENDECTOMY     At time of hernia repair in 1979  . COLONOSCOPY  2011   neg, [facility], Due 2021   . FLEXIBLE SIGMOIDOSCOPY  05/12/1997  . HERNIA REPAIR     Undescended testicle brought down at this time in 1979  . TONSILLECTOMY       Current Meds  Medication Sig  . allopurinol (ZYLOPRIM) 300 MG tablet Take 0.5 tablets (150 mg total) by mouth daily.  Marland Kitchen. amoxicillin-clavulanate (AUGMENTIN) 875-125 MG tablet Take 1 tablet by mouth 2 (two) times daily.  Marland Kitchen. atorvastatin (LIPITOR) 20 MG tablet Take 1 tablet by mouth 4 days per week.  . fluticasone (FLONASE) 50 MCG/ACT nasal spray SHAKE GENTLY BEFORE USE AND PUT 1 SPRAY IN EACH NOSTRIL TWO TIMES A DAY  . Fluticasone-Salmeterol (ADVAIR) 250-50 MCG/DOSE AEPB INHALE ONE PUFF BY MOUTH EVERY 12 HOURS  . ipratropium (ATROVENT) 0.06 % nasal spray Place 2 sprays into both nostrils 4 (four) times daily.  Marland Kitchen. lisinopril (PRINIVIL,ZESTRIL) 20 MG tablet Take 0.5 tablets (10 mg total) by mouth daily.  . metoprolol tartrate (LOPRESSOR) 25 MG tablet Take 0.5 tablets (12.5 mg total) by mouth 2 (two) times daily.  . Multiple Vitamin (MULTIVITAMIN) tablet Take 1 tablet by mouth daily.    [name]. XARELTO 20 MG TABS tablet TAKE ONE TABLET BY MOUTH DAILY WITH SUPPER     Allergies:   Patient has no known allergies.   Social History   Tobacco Use  .  Smoking status: Former Smoker    Packs/day: 2.00    Years: 22.00    Pack years: 44.00    Types: Cigarettes    Last attempt to quit: 02/01/1987    Years since quitting: 31.2  . Smokeless tobacco: Never Used  . Tobacco comment: smoked 1967-1989 , up to 2 ppd  Substance Use Topics  . Alcohol use: Yes    Alcohol/week: 14.0 standard drinks    Types: 14 Cans of beer per week  . Drug use: No     Family Hx: The patient's family history includes Diabetes in his maternal grandmother; Heart attack (age of onset: 85) in his maternal uncle; Heart attack (age of onset: 63) in his father;  Hypertension in his mother; Stroke (age of onset: 44) in his mother. There is no history of Asthma or COPD.  ROS:   Please see the history of present illness.     All other systems reviewed and are negative.   Prior CV studies:   The following studies were reviewed today:  Myovue 01/17/11 Echo 01/15/11  Labs/Other Tests and Data Reviewed:    EKG:  04/25/17 afib rate 69 otherwise normal   Recent Labs: 01/28/2018: ALT 34; BUN 17; Creatinine, Ser 0.94; Hemoglobin 15.0; Platelets 261.0; Potassium 4.6; Sodium 133; TSH 1.08   Recent Lipid Panel Lab Results  Component Value Date/Time   CHOL 132 01/28/2018 10:39 AM   CHOL 149 05/09/2014 10:03 AM   TRIG 102.0 01/28/2018 10:39 AM   TRIG 75 05/09/2014 10:03 AM   TRIG 77 01/14/2006 10:29 AM   HDL 51.10 01/28/2018 10:39 AM   HDL 68 05/09/2014 10:03 AM   CHOLHDL 3 01/28/2018 10:39 AM   LDLCALC 61 01/28/2018 10:39 AM   LDLCALC 66 05/09/2014 10:03 AM    Wt Readings from Last 3 Encounters:  04/27/18 102.1 kg  01/28/18 97.6 kg  08/01/17 97.1 kg     Objective:    Vital Signs:  BP (!) 142/87   Pulse (!) 59   Ht 6\' 3"  (1.905 m)   Wt 102.1 kg   BMI 28.12 kg/m    Well nourished, well developed male in no acute distress. Skin warm and dry no bruising No tachypnea No JVP elevation No edema   ASSESSMENT & PLAN:    1. Chronic afib:  Good rate control and anticoagulation renal function normal stable 2. HLD:  On statin labs with primary  3. HTN:  Well controlled.  Continue current medications and low sodium Dash type diet.   4. Gout:  On zyloprim avoid diuretics   COVID-19 Education: The signs and symptoms of COVID-19 were discussed with the patient and how to seek care for testing (follow up with PCP or arrange E-visit).  The importance of social distancing was discussed today.  Time:   Today, I have spent 30 minutes with the patient with telehealth technology discussing the above problems.     Medication Adjustments/Labs and  Tests Ordered: Current medicines are reviewed at length with the patient today.  Concerns regarding medicines are outlined above.   Tests Ordered: No orders of the defined types were placed in this encounter.   Medication Changes: No orders of the defined types were placed in this encounter.   Disposition:  Follow up in a year   Signed, Charlton Haws, MD  04/27/2018 8:48 AM    [facility]

## 2018-04-27 ENCOUNTER — Encounter: Payer: Self-pay | Admitting: Cardiovascular Disease

## 2018-04-27 ENCOUNTER — Telehealth (INDEPENDENT_AMBULATORY_CARE_PROVIDER_SITE_OTHER): Payer: Medicare Other | Admitting: Cardiovascular Disease

## 2018-04-27 ENCOUNTER — Other Ambulatory Visit: Payer: Self-pay

## 2018-04-27 VITALS — BP 142/87 | HR 59 | Ht 75.0 in | Wt 225.0 lb

## 2018-04-27 DIAGNOSIS — I482 Chronic atrial fibrillation, unspecified: Secondary | ICD-10-CM

## 2018-04-27 MED ORDER — METOPROLOL TARTRATE 25 MG PO TABS: 12.5000 mg | ORAL_TABLET | Freq: Two times a day (BID) | ORAL | 3 refills | Status: DC

## 2018-04-27 MED ORDER — LISINOPRIL 20 MG PO TABS: 10.0000 mg | ORAL_TABLET | Freq: Every day | ORAL | 3 refills | Status: DC

## 2018-04-27 NOTE — Patient Instructions (Addendum)
Medication Instructions:   If you need a refill on your cardiac medications before your next appointment, please call your pharmacy.   Lab work:  If you have labs (blood work) drawn today and your tests are completely normal, you will receive your results only by: Marland Kitchen MyChart Message (if you have MyChart) OR . A paper copy in the mail If you have any lab test that is abnormal or we need to change your treatment, we will call you to review the results.  Testing/Procedures: None ordered today.  Follow-Up: At [facility], you and your health needs are our priority.  As part of our continuing mission to provide you with exceptional heart care, we have created designated Provider Care Teams.  These Care Teams include your primary Cardiologist (physician) and Advanced Practice Providers (APPs -  Physician Assistants and Nurse Practitioners) who all work together to provide you with the care you need, when you need it. You will need a follow up appointment in 12 months.  Please call our office 2 months in advance to schedule this appointment.  You may see Dr. Tillie Fantasia or one of the following Advanced Practice Providers on your designated Care Team:   Norma Fredrickson, NP Nada Boozer, NP . Georgie Chard, NP

## 2018-05-02 ENCOUNTER — Other Ambulatory Visit: Payer: Self-pay | Admitting: Cardiovascular Disease

## 2018-05-04 ENCOUNTER — Other Ambulatory Visit: Payer: Self-pay | Admitting: Cardiovascular Disease

## 2018-06-13 ENCOUNTER — Other Ambulatory Visit: Payer: Self-pay | Admitting: Family Medicine

## 2018-08-25 ENCOUNTER — Other Ambulatory Visit: Payer: Self-pay | Admitting: Cardiovascular Disease

## 2018-08-25 NOTE — Telephone Encounter (Signed)
Xarelto 20mg  refill request received; pt is 73 yrs old, wt-97.6kg, Crea-0.94 on 01/28/2018, last Telemedicine visit by Dr. Johnsie Cancel on 04/27/2018, Diagnosis Afib, CrCl-99.49ml/min; will send in refill.

## 2018-09-16 ENCOUNTER — Telehealth: Payer: Self-pay | Admitting: Family Medicine

## 2018-09-16 NOTE — Telephone Encounter (Signed)
Pt stated he received a message from his pharmacy stating the refill request was denied. Pt stated the Rx for Allopurinol is suppose to be 150 mg.

## 2018-09-16 NOTE — Telephone Encounter (Signed)
Patient needs an appt.

## 2018-09-21 ENCOUNTER — Other Ambulatory Visit: Payer: Self-pay

## 2018-09-21 MED ORDER — ALLOPURINOL 300 MG PO TABS: 150.0000 mg | ORAL_TABLET | Freq: Every day | ORAL | 0 refills | Status: DC

## 2018-09-21 NOTE — Telephone Encounter (Signed)
Rx sent 

## 2018-09-24 ENCOUNTER — Other Ambulatory Visit: Payer: Self-pay

## 2018-09-24 MED ORDER — ALLOPURINOL 300 MG PO TABS: 150.0000 mg | ORAL_TABLET | Freq: Every day | ORAL | 0 refills | Status: DC

## 2018-09-24 NOTE — Telephone Encounter (Signed)
The allopurinol did not get to pharm. Please resend

## 2018-09-24 NOTE — Telephone Encounter (Signed)
Rx has been sent to pharmacy

## 2018-11-18 ENCOUNTER — Other Ambulatory Visit: Payer: Self-pay | Admitting: Family Medicine

## 2018-11-18 NOTE — Telephone Encounter (Signed)
Last OV 01/28/18 Last refill 09/24/18 #30/0 Next OV 02/02/19

## 2018-12-15 ENCOUNTER — Other Ambulatory Visit: Payer: Self-pay | Admitting: Family Medicine

## 2019-02-02 ENCOUNTER — Ambulatory Visit (INDEPENDENT_AMBULATORY_CARE_PROVIDER_SITE_OTHER): Payer: Medicare PPO

## 2019-02-02 ENCOUNTER — Encounter: Payer: Self-pay | Admitting: Family Medicine

## 2019-02-02 ENCOUNTER — Ambulatory Visit (INDEPENDENT_AMBULATORY_CARE_PROVIDER_SITE_OTHER): Payer: Medicare PPO | Admitting: Family Medicine

## 2019-02-02 VITALS — BP 126/70 | HR 60 | Temp 97.0°F | Ht 75.0 in | Wt 215.0 lb

## 2019-02-02 DIAGNOSIS — N401 Enlarged prostate with lower urinary tract symptoms: Secondary | ICD-10-CM | POA: Diagnosis not present

## 2019-02-02 DIAGNOSIS — Z0000 Encounter for general adult medical examination without abnormal findings: Secondary | ICD-10-CM

## 2019-02-02 DIAGNOSIS — Z1211 Encounter for screening for malignant neoplasm of colon: Secondary | ICD-10-CM | POA: Diagnosis not present

## 2019-02-02 DIAGNOSIS — N138 Other obstructive and reflux uropathy: Secondary | ICD-10-CM

## 2019-02-02 DIAGNOSIS — Z0001 Encounter for general adult medical examination with abnormal findings: Secondary | ICD-10-CM | POA: Diagnosis not present

## 2019-02-02 DIAGNOSIS — Z125 Encounter for screening for malignant neoplasm of prostate: Secondary | ICD-10-CM

## 2019-02-02 DIAGNOSIS — Z8739 Personal history of other diseases of the musculoskeletal system and connective tissue: Secondary | ICD-10-CM

## 2019-02-02 DIAGNOSIS — I482 Chronic atrial fibrillation, unspecified: Secondary | ICD-10-CM

## 2019-02-02 DIAGNOSIS — I10 Essential (primary) hypertension: Secondary | ICD-10-CM

## 2019-02-02 DIAGNOSIS — E782 Mixed hyperlipidemia: Secondary | ICD-10-CM | POA: Diagnosis not present

## 2019-02-02 DIAGNOSIS — R739 Hyperglycemia, unspecified: Secondary | ICD-10-CM

## 2019-02-02 DIAGNOSIS — Z6826 Body mass index (BMI) 26.0-26.9, adult: Secondary | ICD-10-CM

## 2019-02-02 DIAGNOSIS — J449 Chronic obstructive pulmonary disease, unspecified: Secondary | ICD-10-CM

## 2019-02-02 DIAGNOSIS — E663 Overweight: Secondary | ICD-10-CM

## 2019-02-02 LAB — COMPREHENSIVE METABOLIC PANEL
ALT: 16 U/L (ref 0–53)
AST: 21 U/L (ref 0–37)
Albumin: 4.1 g/dL (ref 3.5–5.2)
Alkaline Phosphatase: 61 U/L (ref 39–117)
BUN: 16 mg/dL (ref 6–23)
CO2: 28 mEq/L (ref 19–32)
Calcium: 9.6 mg/dL (ref 8.4–10.5)
Chloride: 102 mEq/L (ref 96–112)
Creatinine, Ser: 0.96 mg/dL (ref 0.40–1.50)
GFR: 77.02 mL/min (ref >60.00)
Glucose, Bld: 102 mg/dL — ABNORMAL HIGH (ref 70–99)
Potassium: 4.8 mEq/L (ref 3.5–5.1)
Sodium: 136 mEq/L (ref 135–145)
Total Bilirubin: 1 mg/dL (ref 0.2–1.2)
Total Protein: 6.9 g/dL (ref 6.0–8.3)

## 2019-02-02 LAB — PSA: PSA: 0.27 ng/mL (ref 0.10–4.00)

## 2019-02-02 LAB — LIPID PANEL
Cholesterol: 146 mg/dL (ref 0–200)
HDL: 67.7 mg/dL (ref >39.00)
LDL Cholesterol: 63 mg/dL (ref 0–99)
NonHDL: 77.82
Total CHOL/HDL Ratio: 2
Triglycerides: 75 mg/dL (ref 0.0–149.0)
VLDL: 15 mg/dL (ref 0.0–40.0)

## 2019-02-02 LAB — CBC
HCT: 43.9 % (ref 39.0–52.0)
Hemoglobin: 14.9 g/dL (ref 13.0–17.0)
MCHC: 34 g/dL (ref 30.0–36.0)
MCV: 101.5 fl — ABNORMAL HIGH (ref 78.0–100.0)
Platelets: 267 10*3/uL (ref 150.0–400.0)
RBC: 4.33 Mil/uL (ref 4.22–5.81)
RDW: 13 % (ref 11.5–15.5)
WBC: 8.1 10*3/uL (ref 4.0–10.5)

## 2019-02-02 LAB — TSH: TSH: 1.54 u[IU]/mL (ref 0.35–4.50)

## 2019-02-02 LAB — HEMOGLOBIN A1C: Hgb A1c MFr Bld: 5.4 % (ref 4.6–6.5)

## 2019-02-02 LAB — URIC ACID: Uric Acid, Serum: 6.1 mg/dL (ref 4.0–7.8)

## 2019-02-02 NOTE — Progress Notes (Signed)
Chief Complaint:  Reginald Green is a 74 y.o. male who presents today for his annual comprehensive physical exam.    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  Declines pharmacotherapy for now.  Atrial fibrillation (HCC) Stable.  Rate controlled on metoprolol.  Anticoagulated with Xarelto.  History of gout Check uric acid level today.  Continue allopurinol 150 mg daily.  COPD (chronic obstructive pulmonary disease) (HCC) Stable.  Continue Advair.  Hypertension At goal today.  Continue lisinopril 10 mg daily metoprolol tartrate 12.5 mg daily.  Check CBC, CMP, TSH.   Hyperlipidemia Check CBC, C met, TSH, lipid panel.  Continue Lipitor 20 mg 4 times weekly.  Hyperglycemia Check A1c.   Body mass index is 26.87 kg/m. / Overweight BMI Metric Follow Up - 02/02/19 0941      BMI Metric Follow Up-Please document annually   BMI Metric Follow Up  Education provided       Preventative Healthcare: Discussed Covid vaccine.  Check CBC, C met, TSH, lipid panel, PSA.  Will order Cologuard as well.  Patient Counseling(The following topics were reviewed and/or handout was given):  -Nutrition: Stressed importance of moderation in sodium/caffeine intake, saturated fat and cholesterol, caloric balance, sufficient intake of fresh fruits, vegetables, and fiber.  -Stressed the importance of regular exercise.   -Substance Abuse: Discussed cessation/primary prevention of tobacco, alcohol, or other drug use; driving or other dangerous activities under the influence; availability of treatment for abuse.   -Injury prevention: Discussed safety belts, safety helmets, smoke detector, smoking near bedding or upholstery.   -Sexuality: Discussed sexually transmitted diseases, partner selection, use of condoms, avoidance of unintended pregnancy and contraceptive alternatives.   -Dental health: Discussed importance of regular  tooth brushing, flossing, and dental visits.  -Health maintenance and immunizations reviewed. Please refer to Health maintenance section.  Return to care in 1 year for next preventative visit.     Subjective:  HPI:  He has no acute complaints today.   Lifestyle Diet: Balanced.  Exercise: Walks three times weekly.   Depression screen [facility] 2/9 02/02/2019  Decreased Interest 0  Down, Depressed, Hopeless 0  PHQ - 2 Score 0  Altered sleeping -  Tired, decreased energy -  Change in appetite -  Feeling bad or failure about yourself  -  Trouble concentrating -  Moving slowly or fidgety/restless -  Suicidal thoughts -  PHQ-9 Score -  Difficult doing work/chores -   ROS: A complete review of systems was negative.   PMH:  The following were reviewed and entered/updated in epic: Past Medical History:  Diagnosis Date  . Chronic atrial fibrillation (HCC)    Multiple failed cardioversions (even on flecanide). Normal LV function by echo in 2009 with LA 44m, reported nonischemic myoview at that time  . COPD (chronic obstructive pulmonary disease) ([facility]   . Gilbert's disease   . Gout   . Hyperlipidemia   . Hypertension    Patient Active Problem List   Diagnosis Date Noted  . Actinic keratoses 01/27/2017  . Gilbert's disease   . [place] disease[place] 03/28/2015  . Encounter for therapeutic drug monitoring 02/26/2013  . Hypertension 01/29/2011  . Vertigo 01/10/2011  . History of gout 01/23/2009  . Hyperlipidemia 01/23/2009  . Atrial fibrillation ([facility] 02/02/2007  . Asthma 02/02/2007  . 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 APPENDECTOMY  At time of hernia repair in 1979  . COLONOSCOPY  2011   neg, [facility], Due 2021   . FLEXIBLE SIGMOIDOSCOPY  05/12/1997  . HERNIA REPAIR     Undescended testicle brought down at this time in 1979  . TONSILLECTOMY      Family  History  Problem Relation Age of Onset  . Stroke Mother 9  . Hypertension Mother   . Heart attack Father 47  . Heart attack Maternal Uncle 55  . Diabetes Maternal Grandmother   . Asthma Neg Hx   . COPD Neg Hx     Medications- reviewed and updated Current Outpatient Medications  Medication Sig Dispense Refill  . allopurinol (ZYLOPRIM) 300 MG tablet TAKE 1/2 TABLET BY MOUTH DAILY 30 tablet 1  . atorvastatin (LIPITOR) 20 MG tablet Take 1 tablet by mouth 4 days per week. 90 tablet 2  . fluticasone (FLONASE) 50 MCG/ACT nasal spray SHAKE GENTLY BEFOR EUSE AND INSTILL ONE SPRAY IN EACH NOSTRIL TWO TIMES A DAY 48 g 1  . Fluticasone-Salmeterol (ADVAIR) 250-50 MCG/DOSE AEPB INHALE ONE PUFF BY MOUTH EVERY 12 HOURS 180 each 2  . lisinopril (ZESTRIL) 20 MG tablet Take 0.5 tablets (10 mg total) by mouth daily. 45 tablet 3  . metoprolol tartrate (LOPRESSOR) 25 MG tablet Take 0.5 tablets (12.5 mg total) by mouth 2 (two) times daily. 90 tablet 3  . Multiple Vitamin (MULTIVITAMIN) tablet Take 1 tablet by mouth daily.      Alveda Reasons 20 MG TABS tablet TAKE ONE TABLET BY MOUTH DAILY WITH SUPPER 90 tablet 2   No current facility-administered medications for this visit.    Allergies-reviewed and updated No Known Allergies  Social History   Socioeconomic History  . Marital status: Married    Spouse name: Not on file  . Number of children: 3  . Years of education: 87  . Highest education level: Not on file  Occupational History  . Occupation: Retired   Tobacco Use  . Smoking status: Former Smoker    Packs/day: 2.00    Years: 22.00    Pack years: 44.00    Types: Cigarettes    Quit date: 02/01/1987    Years since quitting: 32.0  . Smokeless tobacco: Never Used  . Tobacco comment: smoked 1967-1989 , up to 2 ppd  Substance and Sexual Activity  . Alcohol use: Yes    Alcohol/week: 14.0 standard drinks    Types: 14 Cans of beer per week  . Drug use: No  . Sexual activity: Never  Other Topics  Concern  . Not on file  Social History Narrative   Fun: Work out in the yard, chase [employer].    Social Determinants of Health   Financial Resource Strain:   . Difficulty of Paying Living Expenses: Not on file  Food Insecurity:   . Worried About Charity fundraiser in the Last Year: Not on file  . Ran Out of Food in the Last Year: Not on file  Transportation Needs:   . Lack of Transportation (Medical): Not on file  . Lack of Transportation (Non-Medical): Not on file  Physical Activity:   . Days of Exercise per Week: Not on file  . Minutes of Exercise per Session: Not on file  Stress:   . Feeling of Stress : Not on file  Social Connections:   . Frequency of Communication with Friends and Family: Not on file  . Frequency of Social Gatherings with Friends and Family: Not on file  .  Attends Religious Services: Not on file  . Active Member of Clubs or Organizations: Not on file  . Attends Archivist Meetings: Not on file  . Marital Status: Not on file        Objective:  Physical Exam: BP 126/70   Pulse 60   Temp (!) 97 F (36.1 C) (Temporal)   Ht 6' 3"  (1.905 m)   Wt 215 lb (97.5 kg)   BMI 26.87 kg/m   Body mass index is 26.87 kg/m. Wt Readings from Last 3 Encounters:  02/02/19 215 lb (97.5 kg)  02/02/19 215 lb (97.5 kg)  04/27/18 225 lb (102.1 kg)   Gen: NAD, resting comfortably HEENT: TMs normal bilaterally. OP clear. No thyromegaly noted.  CV: RRR with no murmurs appreciated Pulm: NWOB, CTAB with no crackles, wheezes, or rhonchi GI: Normal bowel sounds present. Soft, Nontender, Nondistended. MSK: no edema, cyanosis, or clubbing noted Skin: warm, dry Neuro: CN2-12 grossly intact. Strength 5/5 in upper and lower extremities. Reflexes symmetric and intact bilaterally.  Psych: Normal affect and thought content     Rashi Giuliani M. Jerline Pain, MD 02/02/2019 9:41 AM

## 2019-02-02 NOTE — Assessment & Plan Note (Signed)
Stable.  Continue Advair. 

## 2019-02-02 NOTE — Patient Instructions (Signed)
It was very nice to see you today!  We will check blood work today.   Come back in 1 year for your next physical, or sooner if needed.   Take care, Dr Jerline Pain  Please try these tips to maintain a healthy lifestyle:   Eat at least 3 REAL meals and 1-2 snacks per day.  Aim for no more than 5 hours between eating.  If you eat breakfast, please do so within one hour of getting up.    Each meal should contain half fruits/vegetables, one quarter protein, and one quarter carbs (no bigger than a computer mouse)   Cut down on sweet beverages. This includes juice, soda, and sweet tea.     Drink at least 1 glass of water with each meal and aim for at least 8 glasses per day   Exercise at least 150 minutes every week.    Preventive Care 72 Years and Older, Male Preventive care refers to lifestyle choices and visits with your health care provider that can promote health and wellness. This includes:  A yearly physical exam. This is also called an annual well check.  Regular dental and eye exams.  Immunizations.  Screening for certain conditions.  Healthy lifestyle choices, such as diet and exercise. What can I expect for my preventive care visit? Physical exam Your health care provider will check:  Height and weight. These may be used to calculate body mass index (BMI), which is a measurement that tells if you are at a healthy weight.  Heart rate and blood pressure.  Your skin for abnormal spots. Counseling Your health care provider may ask you questions about:  Alcohol, tobacco, and drug use.  Emotional well-being.  Home and relationship well-being.  Sexual activity.  Eating habits.  History of falls.  Memory and ability to understand (cognition).  Work and work Statistician. What immunizations do I need?  Influenza (flu) vaccine  This is recommended every year. Tetanus, diphtheria, and pertussis (Tdap) vaccine  You may need a Td booster every 10 years.  Varicella (chickenpox) vaccine  You may need this vaccine if you have not already been vaccinated. Zoster (shingles) vaccine  You may need this after age 73. Pneumococcal conjugate (PCV13) vaccine  One dose is recommended after age 57. Pneumococcal polysaccharide (PPSV23) vaccine  One dose is recommended after age 30. Measles, mumps, and rubella (MMR) vaccine  You may need at least one dose of MMR if you were born in 1957 or later. You may also need a second dose. Meningococcal conjugate (MenACWY) vaccine  You may need this if you have certain conditions. Hepatitis A vaccine  You may need this if you have certain conditions or if you travel or work in places where you may be exposed to hepatitis A. Hepatitis B vaccine  You may need this if you have certain conditions or if you travel or work in places where you may be exposed to hepatitis B. Haemophilus influenzae type b (Hib) vaccine  You may need this if you have certain conditions. You may receive vaccines as individual doses or as more than one vaccine together in one shot (combination vaccines). Talk with your health care provider about the risks and benefits of combination vaccines. What tests do I need? Blood tests  Lipid and cholesterol levels. These may be checked every 5 years, or more frequently depending on your overall health.  Hepatitis C test.  Hepatitis B test. Screening  Lung cancer screening. You may have this screening every  year starting at age 75 if you have a 30-pack-year history of smoking and currently smoke or have quit within the past 15 years.  Colorectal cancer screening. All adults should have this screening starting at age 38 and continuing until age 75. Your health care provider may recommend screening at age 78 if you are at increased risk. You will have tests every 1-10 years, depending on your results and the type of screening test.  Prostate cancer screening. Recommendations will vary  depending on your family history and other risks.  Diabetes screening. This is done by checking your blood sugar (glucose) after you have not eaten for a while (fasting). You may have this done every 1-3 years.  Abdominal aortic aneurysm (AAA) screening. You may need this if you are a current or former smoker.  Sexually transmitted disease (STD) testing. Follow these instructions at home: Eating and drinking  Eat a diet that includes fresh fruits and vegetables, whole grains, lean protein, and low-fat dairy products. Limit your intake of foods with high amounts of sugar, saturated fats, and salt.  Take vitamin and mineral supplements as recommended by your health care provider.  Do not drink alcohol if your health care provider tells you not to drink.  If you drink alcohol: ? Limit how much you have to 0-2 drinks a day. ? Be aware of how much alcohol is in your drink. In the [place] one drink equals one 12 oz bottle of beer (355 mL), one 5 oz glass of wine (148 mL), or one 1 oz glass of hard liquor (44 mL). Lifestyle  Take daily care of your teeth and gums.  Stay active. Exercise for at least 30 minutes on 5 or more days each week.  Do not use any products that contain nicotine or tobacco, such as cigarettes, e-cigarettes, and chewing tobacco. If you need help quitting, ask your health care provider.  If you are sexually active, practice safe sex. Use a condom or other form of protection to prevent STIs (sexually transmitted infections).  Talk with your health care provider about taking a low-dose aspirin or statin. What's next?  Visit your health care provider once a year for a well check visit.  Ask your health care provider how often you should have your eyes and teeth checked.  Stay up to date on all vaccines. This information is not intended to replace advice given to you by your health care provider. Make sure you discuss any questions you have with your health care  provider. Document Revised: 12/18/2017 Document Reviewed: 12/18/2017 Elsevier Patient Education  2020 [employer].

## 2019-02-02 NOTE — Assessment & Plan Note (Signed)
Check CBC, C met, TSH, lipid panel.  Continue Lipitor 20 mg 4 times weekly.

## 2019-02-02 NOTE — Assessment & Plan Note (Signed)
Check uric acid level today.  Continue allopurinol 150 mg daily.

## 2019-02-02 NOTE — Assessment & Plan Note (Signed)
Stable.  Rate controlled on metoprolol.  Anticoagulated with Xarelto.

## 2019-02-02 NOTE — Patient Instructions (Addendum)
[name] , Thank you for taking time to come for your Medicare Wellness Visit. I appreciate your ongoing commitment to your health goals. Please review the following plan we discussed and let me know if I can assist you in the future.   Screening recommendations/referrals: Colorectal Screening: up to date; last 02/15/09 (repeat due 2021) Cologuard ordered today   Vision and Dental Exams: Recommended annual ophthalmology exams for early detection of glaucoma and other disorders of the eye Recommended annual dental exams for proper oral hygiene  Vaccinations: Influenza vaccine: completed 09/17/18 Pneumococcal vaccine: up to date; last 05/09/14 Tdap vaccine: up to date; last 01/26/10 Shingles vaccine: Shingrix completed   Advanced directives: We have received a copy of your POA (Power of Reginald Green) and/or Living Will. These documents can be located in your chart.  Goals: Recommend to drink at least 6-8 8oz glasses of water per day and consume a balanced diet rich in fresh fruits and vegetables.   Next appointment: Please schedule your Annual Wellness Visit with your Nurse Health Advisor in one year.  Preventive Care 72 Years and Older, Male Preventive care refers to lifestyle choices and visits with your health care provider that can promote health and wellness. What does preventive care include?  A yearly physical exam. This is also called an annual well check.  Dental exams once or twice a year.  Routine eye exams. Ask your health care provider how often you should have your eyes checked.  Personal lifestyle choices, including:  Daily care of your teeth and gums.  Regular physical activity.  Eating a healthy diet.  Avoiding tobacco and drug use.  Limiting alcohol use.  Practicing safe sex.  Taking low doses of aspirin every day if recommended by your health care provider..  Taking vitamin and mineral supplements as recommended by your health care provider. What happens  during an annual well check? The services and screenings done by your health care provider during your annual well check will depend on your age, overall health, lifestyle risk factors, and family history of disease. Counseling  Your health care provider may ask you questions about your:  Alcohol use.  Tobacco use.  Drug use.  Emotional well-being.  Home and relationship well-being.  Sexual activity.  Eating habits.  History of falls.  Memory and ability to understand (cognition).  Work and work Astronomer. Screening  You may have the following tests or measurements:  Height, weight, and BMI.  Blood pressure.  Lipid and cholesterol levels. These may be checked every 5 years, or more frequently if you are over 38 years old.  Skin check.  Lung cancer screening. You may have this screening every year starting at age 21 if you have a 30-pack-year history of smoking and currently smoke or have quit within the past 15 years.  Fecal occult blood test (FOBT) of the stool. You may have this test every year starting at age 33.  Flexible sigmoidoscopy or colonoscopy. You may have a sigmoidoscopy every 5 years or a colonoscopy every 10 years starting at age 61.  Prostate cancer screening. Recommendations will vary depending on your family history and other risks.  Hepatitis C blood test.  Hepatitis B blood test.  Sexually transmitted disease (STD) testing.  Diabetes screening. This is done by checking your blood sugar (glucose) after you have not eaten for a while (fasting). You may have this done every 1-3 years.  Abdominal aortic aneurysm (AAA) screening. You may need this if you are a current  or former smoker.  Osteoporosis. You may be screened starting at age 29 if you are at high risk. Talk with your health care provider about your test results, treatment options, and if necessary, the need for more tests. Vaccines  Your health care provider may recommend certain  vaccines, such as:  Influenza vaccine. This is recommended every year.  Tetanus, diphtheria, and acellular pertussis (Tdap, Td) vaccine. You may need a Td booster every 10 years.  Zoster vaccine. You may need this after age 49.  Pneumococcal 13-valent conjugate (PCV13) vaccine. One dose is recommended after age 79.  Pneumococcal polysaccharide (PPSV23) vaccine. One dose is recommended after age 9. Talk to your health care provider about which screenings and vaccines you need and how often you need them. This information is not intended to replace advice given to you by your health care provider. Make sure you discuss any questions you have with your health care provider. Document Released: 01/20/2015 Document Revised: 09/13/2015 Document Reviewed: 10/25/2014 Elsevier Interactive Patient Education  2017 [place] Prevention in the Home Falls can cause injuries. They can happen to people of all ages. There are many things you can do to make your home safe and to help prevent falls. What can I do on the outside of my home?  Regularly fix the edges of walkways and driveways and fix any cracks.  Remove anything that might make you trip as you walk through a door, such as a raised step or threshold.  Trim any bushes or trees on the path to your home.  Use bright outdoor lighting.  Clear any walking paths of anything that might make someone trip, such as rocks or tools.  Regularly check to see if handrails are loose or broken. Make sure that both sides of any steps have handrails.  Any raised decks and porches should have guardrails on the edges.  Have any leaves, snow, or ice cleared regularly.  Use sand or salt on walking paths during winter.  Clean up any spills in your garage right away. This includes oil or grease spills. What can I do in the bathroom?  Use night lights.  Install grab bars by the toilet and in the tub and shower. Do not use towel bars as grab  bars.  Use non-skid mats or decals in the tub or shower.  If you need to sit down in the shower, use a plastic, non-slip stool.  Keep the floor dry. Clean up any water that spills on the floor as soon as it happens.  Remove soap buildup in the tub or shower regularly.  Attach bath mats securely with double-sided non-slip rug tape.  Do not have throw rugs and other things on the floor that can make you trip. What can I do in the bedroom?  Use night lights.  Make sure that you have a light by your bed that is easy to reach.  Do not use any sheets or blankets that are too big for your bed. They should not hang down onto the floor.  Have a firm chair that has side arms. You can use this for support while you get dressed.  Do not have throw rugs and other things on the floor that can make you trip. What can I do in the kitchen?  Clean up any spills right away.  Avoid walking on wet floors.  Keep items that you use a lot in easy-to-reach places.  If you need to reach something above you,  use a strong step stool that has a grab bar.  Keep electrical cords out of the way.  Do not use floor polish or wax that makes floors slippery. If you must use wax, use non-skid floor wax.  Do not have throw rugs and other things on the floor that can make you trip. What can I do with my stairs?  Do not leave any items on the stairs.  Make sure that there are handrails on both sides of the stairs and use them. Fix handrails that are broken or loose. Make sure that handrails are as long as the stairways.  Check any carpeting to make sure that it is firmly attached to the stairs. Fix any carpet that is loose or worn.  Avoid having throw rugs at the top or bottom of the stairs. If you do have throw rugs, attach them to the floor with carpet tape.  Make sure that you have a light switch at the top of the stairs and the bottom of the stairs. If you do not have them, ask someone to add them for  you. What else can I do to help prevent falls?  Wear shoes that:  Do not have high heels.  Have rubber bottoms.  Are comfortable and fit you well.  Are closed at the toe. Do not wear sandals.  If you use a stepladder:  Make sure that it is fully opened. Do not climb a closed stepladder.  Make sure that both sides of the stepladder are locked into place.  Ask someone to hold it for you, if possible.  Clearly mark and make sure that you can see:  Any grab bars or handrails.  First and last steps.  Where the edge of each step is.  Use tools that help you move around (mobility aids) if they are needed. These include:  Canes.  Walkers.  Scooters.  Crutches.  Turn on the lights when you go into a dark area. Replace any light bulbs as soon as they burn out.  Set up your furniture so you have a clear path. Avoid moving your furniture around.  If any of your floors are uneven, fix them.  If there are any pets around you, be aware of where they are.  Review your medicines with your doctor. Some medicines can make you feel dizzy. This can increase your chance of falling. Ask your doctor what other things that you can do to help prevent falls. This information is not intended to replace advice given to you by your health care provider. Make sure you discuss any questions you have with your health care provider. Document Released: 10/20/2008 Document Revised: 06/01/2015 Document Reviewed: 01/28/2014 Elsevier Interactive Patient Education  2017 [employer].

## 2019-02-02 NOTE — Assessment & Plan Note (Signed)
Stable off meds.  Declines pharmacotherapy for now.

## 2019-02-02 NOTE — Assessment & Plan Note (Signed)
At goal today.  Continue lisinopril 10 mg daily metoprolol tartrate 12.5 mg daily.  Check CBC, CMP, TSH.

## 2019-02-02 NOTE — Progress Notes (Signed)
Subjective:   Reginald Green is a 74 y.o. male who presents for Medicare Annual/Subsequent preventive examination.  Review of Systems:   Cardiac Risk Factors include: advanced age (>42men, >66 women);hypertension;male gender;dyslipidemia    Objective:    Vitals: BP 126/70   Pulse 60   Temp (!) 97 F (36.1 C) (Temporal)   Ht 6\' 3"  (1.905 m)   Wt 215 lb (97.5 kg)   BMI 26.87 kg/m   Body mass index is 26.87 kg/m.  Advanced Directives 02/02/2019 01/24/2017 12/13/2014 01/10/2011  Does Patient Have a Medical Advance Directive? Yes Yes No Patient has advance directive, copy not in chart  Type of Advance Directive Living will;Healthcare Power of 03/10/2011 Power of [place];Living will - Living will  Does patient want to make changes to medical advance directive? No - Patient declined No - Patient declined - -  Copy of Healthcare Power of Attorney in Chart? Yes - validated most recent copy scanned in chart (See row information) No - copy requested - -  Pre-existing out of facility DNR order (yellow form or pink MOST form) - - - No    Tobacco Social History   Tobacco Use  Smoking Status Former Smoker  . Packs/day: 2.00  . Years: 22.00  . Pack years: 44.00  . Types: Cigarettes  . Quit date: 02/01/1987  . Years since quitting: 32.0  Smokeless Tobacco Never Used  Tobacco Comment   smoked [phone] , up to 2 ppd     Counseling given: Not Answered Comment: smoked 1967-1989 , up to 2 ppd   Clinical Intake:  Pre-visit preparation completed: Yes  Pain : No/denies pain  Diabetes: No  How often do you need to have someone help you when you read instructions, pamphlets, or other written materials from your doctor or pharmacy?: 1 - Never  Interpreter Needed?: No  Information entered by :: [email or web address] LPN  Past Medical History:  Diagnosis Date  . Chronic atrial fibrillation (HCC)    Multiple failed cardioversions (even on flecanide). Normal LV function by echo  in 2009 with LA 46mm, reported nonischemic myoview at that time  . COPD (chronic obstructive pulmonary disease) (HCC)   . Gilbert's disease   . Gout   . Hyperlipidemia   . Hypertension    Past Surgical History:  Procedure Laterality Date  . APPENDECTOMY     At time of hernia repair in 1979  . COLONOSCOPY  2011   neg, [facility], Due 2021   . FLEXIBLE SIGMOIDOSCOPY  05/12/1997  . HERNIA REPAIR     Undescended testicle brought down at this time in 1979  . TONSILLECTOMY     Family History  Problem Relation Age of Onset  . Stroke Mother 60  . Hypertension Mother   . Heart attack Father 8  . Heart attack Maternal Uncle 55  . Diabetes Maternal Grandmother   . Asthma Neg Hx   . COPD Neg Hx    Social History   Socioeconomic History  . Marital status: Married    Spouse name: Not on file  . Number of children: 3  . Years of education: 90  . Highest education level: Not on file  Occupational History  . Occupation: Retired   Tobacco Use  . Smoking status: Former Smoker    Packs/day: 2.00    Years: 22.00    Pack years: 44.00    Types: Cigarettes    Quit date: 02/01/1987    Years since quitting: 32.0  .  Smokeless tobacco: Never Used  . Tobacco comment: smoked 1967-1989 , up to 2 ppd  Substance and Sexual Activity  . Alcohol use: Yes    Alcohol/week: 14.0 standard drinks    Types: 14 Cans of beer per week  . Drug use: No  . Sexual activity: Never  Other Topics Concern  . Not on file  Social History Narrative   Fun: Work out in the yard, chase [employer].    Social Determinants of Health   Financial Resource Strain:   . Difficulty of Paying Living Expenses: Not on file  Food Insecurity:   . Worried About Programme researcher, broadcasting/film/video in the Last Year: Not on file  . Ran Out of Food in the Last Year: Not on file  Transportation Needs:   . Lack of Transportation (Medical): Not on file  . Lack of Transportation (Non-Medical): Not on file  Physical Activity:   . Days of  Exercise per Week: Not on file  . Minutes of Exercise per Session: Not on file  Stress:   . Feeling of Stress : Not on file  Social Connections:   . Frequency of Communication with Friends and Family: Not on file  . Frequency of Social Gatherings with Friends and Family: Not on file  . Attends Religious Services: Not on file  . Active Member of Clubs or Organizations: Not on file  . Attends Banker Meetings: Not on file  . Marital Status: Not on file    Outpatient Encounter Medications as of 02/02/2019  Medication Sig  . allopurinol (ZYLOPRIM) 300 MG tablet TAKE 1/2 TABLET BY MOUTH DAILY  . amoxicillin-clavulanate (AUGMENTIN) 875-125 MG tablet Take 1 tablet by mouth 2 (two) times daily.  Marland Kitchen atorvastatin (LIPITOR) 20 MG tablet Take 1 tablet by mouth 4 days per week.  . fluticasone (FLONASE) 50 MCG/ACT nasal spray SHAKE GENTLY BEFOR EUSE AND INSTILL ONE SPRAY IN EACH NOSTRIL TWO TIMES A DAY  . Fluticasone-Salmeterol (ADVAIR) 250-50 MCG/DOSE AEPB INHALE ONE PUFF BY MOUTH EVERY 12 HOURS  . ipratropium (ATROVENT) 0.06 % nasal spray Place 2 sprays into both nostrils 4 (four) times daily.  Marland Kitchen lisinopril (ZESTRIL) 20 MG tablet Take 0.5 tablets (10 mg total) by mouth daily.  . metoprolol tartrate (LOPRESSOR) 25 MG tablet Take 0.5 tablets (12.5 mg total) by mouth 2 (two) times daily.  . Multiple Vitamin (MULTIVITAMIN) tablet Take 1 tablet by mouth daily.    [name] 20 MG TABS tablet TAKE ONE TABLET BY MOUTH DAILY WITH SUPPER   No facility-administered encounter medications on file as of 02/02/2019.    Activities of Daily Living In your present state of health, do you have any difficulty performing the following activities: 02/02/2019  Hearing? N  Vision? N  Difficulty concentrating or making decisions? N  Walking or climbing stairs? N  Dressing or bathing? N  Doing errands, shopping? N  Preparing Food and eating ? N  Using the Toilet? N  In the past six months, have you  accidently leaked urine? N  Do you have problems with loss of bowel control? N  Managing your Medications? N  Managing your Finances? N  Housekeeping or managing your Housekeeping? N  Some recent data might be hidden    Patient Care Team: Ardith Dark, MD as PCP - General (Family Medicine) Mia Creek, MD as Consulting Physician (Ophthalmology) Wendall Stade, MD as Consulting Physician (Cardiology)   Assessment:   This is a routine wellness examination for [place].  Exercise Activities and Dietary recommendations Current Exercise Habits: Home exercise routine, Type of exercise: walking;stretching;strength training/weights, Time (Minutes): 45, Frequency (Times/Week): 5, Weekly Exercise (Minutes/Week): 225, Intensity: Moderate  Goals    . Maintain current health status.       Fall Risk Fall Risk  02/02/2019 01/28/2018 01/24/2017 01/17/2016 05/09/2014  Falls in the past year? 0 0 No No No  Number falls in past yr: 0 - - - -  Injury with Fall? 0 - - - -  Follow up Falls evaluation completed;Education provided;Falls prevention discussed - - - -   Is the patient's home free of loose throw rugs in walkways, pet beds, electrical cords, etc?   yes      Grab bars in the bathroom? yes      Handrails on the stairs?   yes      Adequate lighting?   yes  Timed Get Up and Go Performed: completed and within normal timeframe; no gait abnormalities noted   Depression Screen PHQ 2/9 Scores 02/02/2019 01/24/2017 01/17/2016 05/09/2014  PHQ - 2 Score 0 0 0 0  PHQ- 9 Score - 0 - -    Cognitive Function     6CIT Screen 02/02/2019  What Year? 0 points  What month? 0 points  What time? 0 points  Count back from 20 0 points  Months in reverse 0 points  Repeat phrase 0 points  Total Score 0    Immunization History  Administered Date(s) Administered  . Influenza, High Dose Seasonal PF 10/15/2013, 09/17/2018  . Influenza, Seasonal, Injecte, Preservative Fre 09/24/2012  . Influenza-Unspecified  10/17/2014, 10/20/2015, 10/15/2016, 10/12/2017  . Pneumococcal Conjugate-13 05/09/2014  . Pneumococcal Polysaccharide-23 05/05/2012  . Td 01/26/2010  . Zoster 06/25/2012, 02/16/2016, 05/15/2016    Qualifies for Shingles Vaccine? Shingrix completed   Screening Tests Health Maintenance  Topic Date Due  . COLONOSCOPY  02/16/2019  . TETANUS/TDAP  01/27/2020  . INFLUENZA VACCINE  Completed  . Hepatitis C Screening  Completed  . PNA vac Low Risk Adult  Completed   Cancer Screenings: Lung: Low Dose CT Chest recommended if Age 59-80 years, 30 pack-year currently smoking OR have quit w/in 15years. Patient does not qualify. Colorectal: last colonoscopy 02/15/09 normal; Cologuard ordered today    Plan:  I have personally reviewed and addressed the Medicare Annual Wellness questionnaire and have noted the following in the patient's chart:  A. Medical and social history B. Use of alcohol, tobacco or illicit drugs  C. Current medications and supplements D. Functional ability and status E.  Nutritional status F.  Physical activity G. Advance directives H. List of other physicians I.  Hospitalizations, surgeries, and ER visits in previous 12 months J.  [place] such as hearing and vision if needed, cognitive and depression L. Referrals, records requested, and appointments- Cologuard ordered   In addition, I have reviewed and discussed with patient certain preventive protocols, quality metrics, and best practice recommendations. A written personalized care plan for preventive services as well as general preventive health recommendations were provided to patient.   Signed,  Denman George, LPN  Nurse Health Advisor   Nurse Notes: no additional

## 2019-02-03 NOTE — Progress Notes (Signed)
Please inform patient of the following:  Blood work is stable compared to last year. Would like for him to keep up the good work and we can recheck in a year or so.  Katina Degree. Jimmey Ralph, MD 02/03/2019 8:07 AM

## 2019-02-15 DIAGNOSIS — Z1211 Encounter for screening for malignant neoplasm of colon: Secondary | ICD-10-CM | POA: Diagnosis not present

## 2019-02-16 ENCOUNTER — Other Ambulatory Visit: Payer: Self-pay | Admitting: Family Medicine

## 2019-02-18 LAB — COLOGUARD
COLOGUARD: NEGATIVE
Cologuard: NEGATIVE

## 2019-02-22 ENCOUNTER — Other Ambulatory Visit: Payer: Self-pay | Admitting: Cardiovascular Disease

## 2019-02-23 NOTE — Telephone Encounter (Signed)
Prescription refill request for Xarelto received.   Last office visit: Nishan 04/27/2018 Weight: 97.5kg Age: 74 y.o. Scr: 0.96, 02/02/2019 CrCl: 97 ml/min   Prescription refill sent.

## 2019-03-10 ENCOUNTER — Encounter: Payer: Self-pay | Admitting: Family Medicine

## 2019-03-10 ENCOUNTER — Other Ambulatory Visit: Payer: Self-pay | Admitting: Family Medicine

## 2019-03-16 ENCOUNTER — Encounter: Payer: Self-pay | Admitting: Family Medicine

## 2019-03-17 NOTE — Progress Notes (Signed)
Please inform patient of the following:  Cologuard is negative.  Geselle Cardosa M. Herman Fiero, MD 03/17/2019 7:54 AM  

## 2019-04-21 ENCOUNTER — Other Ambulatory Visit: Payer: Self-pay | Admitting: Cardiovascular Disease

## 2019-04-27 NOTE — Progress Notes (Signed)
Evaluation Performed:  Follow-up visit  Date:  05/05/2019   ID:  Reginald Green, DOB 1945/12/21, MRN [id number]  PCP:  Reginald Barrack, MD  Cardiologist:  Reginald Green    History of Present Illness:    74 y.o. history of chronic afib on Xarelto, HTN, HLD and COPD. 12/15/15 had left gluteal hematoma after trauma No neurologic issues when anticoagulation held. Last testing with echo/myovue in 2013 showed no evidence CAD and normal EF  I take care of his wife Reginald Green as well who has aortic stenosis   No issues Enjoys following grand daughters softball games   Brother moved  here from [place] so daughter can go to [employer]  But she ended up at [employer]  Labs from 02/02/19 reviewed and A1c 5.4 TSH 1.54 LDL 63   No cardiac symptoms  Has had COVID vaccine    The patient does not have symptoms concerning for COVID-19 infection (fever, chills, cough, or new shortness of breath).    Past Medical History:  Diagnosis Date  . Chronic atrial fibrillation (HCC)    Multiple failed cardioversions (even on flecanide). Normal LV function by echo in 2009 with LA 47mm, reported nonischemic myoview at that time  . COPD (chronic obstructive pulmonary disease) ([place])   . Gilbert's disease   . Gout   . Hyperlipidemia   . Hypertension    Past Surgical History:  Procedure Laterality Date  . APPENDECTOMY     At time of hernia repair in 1979  . COLONOSCOPY  2011   neg, [facility], Due 2021   . FLEXIBLE SIGMOIDOSCOPY  05/12/1997  . HERNIA REPAIR     Undescended testicle brought down at this time in 1979  . TONSILLECTOMY       Current Meds  Medication Sig  . allopurinol (ZYLOPRIM) 300 MG tablet TAKE ONE-HALF (150 MG TOTAL) TABLET BY MOUTH DAILY  . atorvastatin (LIPITOR) 20 MG tablet Take 1 tablet by mouth 4 days per week.  . fluticasone (FLONASE) 50 MCG/ACT nasal spray SHAKE GENTLY BEFOR EUSE AND INSTILL ONE SPRAY IN EACH NOSTRIL TWO TIMES A DAY  . Fluticasone-Salmeterol (ADVAIR) 250-50  MCG/DOSE AEPB INHALE ONE PUFF BY MOUTH EVERY 12 HOURS  . lisinopril (ZESTRIL) 20 MG tablet TAKE ONE-HALF TABLET BY MOUTH DAILY  . metoprolol tartrate (LOPRESSOR) 25 MG tablet Take 0.5 tablets (12.5 mg total) by mouth 2 (two) times daily.  . Multiple Vitamin (MULTIVITAMIN) tablet Take 1 tablet by mouth daily.    Reginald Green 20 MG TABS tablet TAKE ONE TABLET BY MOUTH DAILY WITH SUPPER     Allergies:   Patient has no known allergies.   Social History   Tobacco Use  . Smoking status: Former Smoker    Packs/day: 2.00    Years: 22.00    Pack years: 44.00    Types: Cigarettes    Quit date: 02/01/1987    Years since quitting: 32.2  . Smokeless tobacco: Never Used  . Tobacco comment: smoked 1967-1989 , up to 2 ppd  Substance Use Topics  . Alcohol use: Yes    Alcohol/week: 14.0 standard drinks    Types: 14 Cans of beer per week  . Drug use: No     Family Hx: The patient's family history includes Diabetes in his maternal grandmother; Heart attack (age of onset: 77) in his maternal uncle; Heart attack (age of onset: 45) in his father; Hypertension in his mother; Stroke (age of onset: 61) in his mother. There is  no history of Asthma or COPD.  ROS:   Please see the history of present illness.     All other systems reviewed and are negative.   Prior CV studies:   The following studies were reviewed today:  Myovue 01/17/11 Echo 01/15/11  Labs/Other Tests and Data Reviewed:    EKG:  04/25/17 afib rate 69 otherwise normal Afib/flutter RBBB rate 44   Recent Labs: 02/02/2019: ALT 16; BUN 16; Creatinine, Ser 0.96; Hemoglobin 14.9; Platelets 267.0; Potassium 4.8; Sodium 136; TSH 1.54   Recent Lipid Panel Lab Results  Component Value Date/Time   CHOL 146 02/02/2019 09:39 AM   CHOL 149 05/09/2014 10:03 AM   TRIG 75.0 02/02/2019 09:39 AM   TRIG 75 05/09/2014 10:03 AM   TRIG 77 01/14/2006 10:29 AM   HDL 67.70 02/02/2019 09:39 AM   HDL 68 05/09/2014 10:03 AM   CHOLHDL 2 02/02/2019 09:39 AM    LDLCALC 63 02/02/2019 09:39 AM   LDLCALC 66 05/09/2014 10:03 AM    Wt Readings from Last 3 Encounters:  05/05/19 215 lb (97.5 kg)  02/02/19 215 lb (97.5 kg)  02/02/19 215 lb (97.5 kg)     Objective:    Vital Signs:  BP 114/80   Pulse (!) 44   Ht 6\' 3"  (1.905 m)   Wt 215 lb (97.5 kg)   SpO2 98%   BMI 26.87 kg/m    Affect appropriate Healthy:  appears stated age HEENT: normal Neck supple with no adenopathy JVP normal no bruits no thyromegaly Lungs clear with no wheezing and good diaphragmatic motion Heart:  S1/S2 no murmur, no rub, gallop or click PMI normal Abdomen: benighn, BS positve, no tenderness, no AAA no bruit.  No HSM or HJR Distal pulses intact with no bruits No edema Neuro non-focal Skin warm and dry No muscular weakness    ASSESSMENT & PLAN:    1. Chronic afib:  Good rate control and anticoagulation renal function normal stable 2. HLD:  On statin labs with primary LDL at goal  3. HTN:  Well controlled.  Continue current medications and low sodium Dash type diet.   4. Gout:  On zyloprim avoid diuretics   Medication Adjustments/Labs and Tests Ordered: Current medicines are reviewed at length with the patient today.  Concerns regarding medicines are outlined above.   Tests Ordered:  Echo for afib   Medication Changes: No orders of the defined types were placed in this encounter.   Disposition:  Follow up in a year if echo shows normal EF   Signed, , MD  05/05/2019 8:56 AM    [facility]

## 2019-04-28 ENCOUNTER — Other Ambulatory Visit: Payer: Self-pay | Admitting: Cardiovascular Disease

## 2019-05-05 ENCOUNTER — Ambulatory Visit: Payer: Medicare PPO | Admitting: Cardiovascular Disease

## 2019-05-05 ENCOUNTER — Encounter: Payer: Self-pay | Admitting: Cardiovascular Disease

## 2019-05-05 ENCOUNTER — Other Ambulatory Visit: Payer: Self-pay

## 2019-05-05 VITALS — BP 114/80 | HR 44 | Ht 75.0 in | Wt 215.0 lb

## 2019-05-05 DIAGNOSIS — I4891 Unspecified atrial fibrillation: Secondary | ICD-10-CM | POA: Diagnosis not present

## 2019-05-05 NOTE — Patient Instructions (Addendum)

## 2019-05-08 ENCOUNTER — Other Ambulatory Visit: Payer: Self-pay | Admitting: Family Medicine

## 2019-05-10 ENCOUNTER — Other Ambulatory Visit: Payer: Self-pay | Admitting: Family Medicine

## 2019-05-18 ENCOUNTER — Other Ambulatory Visit: Payer: Self-pay | Admitting: Family Medicine

## 2019-06-09 DIAGNOSIS — L821 Other seborrheic keratosis: Secondary | ICD-10-CM | POA: Diagnosis not present

## 2019-06-09 DIAGNOSIS — L570 Actinic keratosis: Secondary | ICD-10-CM | POA: Diagnosis not present

## 2019-06-13 ENCOUNTER — Other Ambulatory Visit: Payer: Self-pay | Admitting: Cardiovascular Disease

## 2019-08-03 ENCOUNTER — Other Ambulatory Visit: Payer: Self-pay | Admitting: Family Medicine

## 2019-08-19 ENCOUNTER — Other Ambulatory Visit: Payer: Self-pay | Admitting: Family Medicine

## 2019-09-11 ENCOUNTER — Other Ambulatory Visit: Payer: Self-pay | Admitting: Family Medicine

## 2019-11-09 ENCOUNTER — Other Ambulatory Visit: Payer: Self-pay | Admitting: Family Medicine

## 2019-11-11 ENCOUNTER — Other Ambulatory Visit: Payer: Self-pay | Admitting: Family Medicine

## 2019-11-15 ENCOUNTER — Other Ambulatory Visit: Payer: Self-pay | Admitting: Family Medicine

## 2020-02-04 ENCOUNTER — Ambulatory Visit (INDEPENDENT_AMBULATORY_CARE_PROVIDER_SITE_OTHER): Payer: Medicare PPO

## 2020-02-04 ENCOUNTER — Other Ambulatory Visit: Payer: Self-pay

## 2020-02-04 ENCOUNTER — Encounter: Payer: Self-pay | Admitting: Family Medicine

## 2020-02-04 ENCOUNTER — Ambulatory Visit (INDEPENDENT_AMBULATORY_CARE_PROVIDER_SITE_OTHER): Payer: Medicare PPO | Admitting: Family Medicine

## 2020-02-04 VITALS — BP 122/80 | HR 65 | Temp 97.5°F | Ht 75.0 in | Wt 220.2 lb

## 2020-02-04 VITALS — BP 120/80 | HR 82 | Wt 220.2 lb

## 2020-02-04 DIAGNOSIS — Z0001 Encounter for general adult medical examination with abnormal findings: Secondary | ICD-10-CM

## 2020-02-04 DIAGNOSIS — M1990 Unspecified osteoarthritis, unspecified site: Secondary | ICD-10-CM

## 2020-02-04 DIAGNOSIS — Z8739 Personal history of other diseases of the musculoskeletal system and connective tissue: Secondary | ICD-10-CM | POA: Diagnosis not present

## 2020-02-04 DIAGNOSIS — I482 Chronic atrial fibrillation, unspecified: Secondary | ICD-10-CM

## 2020-02-04 DIAGNOSIS — Z0000 Encounter for general adult medical examination without abnormal findings: Secondary | ICD-10-CM

## 2020-02-04 DIAGNOSIS — I10 Essential (primary) hypertension: Secondary | ICD-10-CM | POA: Diagnosis not present

## 2020-02-04 DIAGNOSIS — R739 Hyperglycemia, unspecified: Secondary | ICD-10-CM | POA: Diagnosis not present

## 2020-02-04 DIAGNOSIS — R42 Dizziness and giddiness: Secondary | ICD-10-CM

## 2020-02-04 DIAGNOSIS — N401 Enlarged prostate with lower urinary tract symptoms: Secondary | ICD-10-CM

## 2020-02-04 DIAGNOSIS — J449 Chronic obstructive pulmonary disease, unspecified: Secondary | ICD-10-CM

## 2020-02-04 DIAGNOSIS — E782 Mixed hyperlipidemia: Secondary | ICD-10-CM | POA: Diagnosis not present

## 2020-02-04 DIAGNOSIS — N138 Other obstructive and reflux uropathy: Secondary | ICD-10-CM | POA: Diagnosis not present

## 2020-02-04 LAB — HEMOGLOBIN A1C: Hgb A1c MFr Bld: 5.4 % (ref 4.6–6.5)

## 2020-02-04 LAB — COMPREHENSIVE METABOLIC PANEL
ALT: 20 U/L (ref 0–53)
AST: 23 U/L (ref 0–37)
Albumin: 4 g/dL (ref 3.5–5.2)
Alkaline Phosphatase: 57 U/L (ref 39–117)
BUN: 22 mg/dL (ref 6–23)
CO2: 27 mEq/L (ref 19–32)
Calcium: 9.4 mg/dL (ref 8.4–10.5)
Chloride: 104 mEq/L (ref 96–112)
Creatinine, Ser: 1 mg/dL (ref 0.40–1.50)
GFR: 74.99 mL/min (ref >60.00)
Glucose, Bld: 93 mg/dL (ref 70–99)
Potassium: 4.7 mEq/L (ref 3.5–5.1)
Sodium: 137 mEq/L (ref 135–145)
Total Bilirubin: 1 mg/dL (ref 0.2–1.2)
Total Protein: 6.7 g/dL (ref 6.0–8.3)

## 2020-02-04 LAB — URIC ACID: Uric Acid, Serum: 5.6 mg/dL (ref 4.0–7.8)

## 2020-02-04 LAB — PSA: PSA: 0.29 ng/mL (ref 0.10–4.00)

## 2020-02-04 LAB — CBC
HCT: 42 % (ref 39.0–52.0)
Hemoglobin: 14.5 g/dL (ref 13.0–17.0)
MCHC: 34.6 g/dL (ref 30.0–36.0)
MCV: 100.2 fl — ABNORMAL HIGH (ref 78.0–100.0)
Platelets: 228 10*3/uL (ref 150.0–400.0)
RBC: 4.2 Mil/uL — ABNORMAL LOW (ref 4.22–5.81)
RDW: 12.7 % (ref 11.5–15.5)
WBC: 8.6 10*3/uL (ref 4.0–10.5)

## 2020-02-04 LAB — LIPID PANEL
Cholesterol: 148 mg/dL (ref 0–200)
HDL: 64 mg/dL (ref >39.00)
LDL Cholesterol: 67 mg/dL (ref 0–99)
NonHDL: 84.36
Total CHOL/HDL Ratio: 2
Triglycerides: 86 mg/dL (ref 0.0–149.0)
VLDL: 17.2 mg/dL (ref 0.0–40.0)

## 2020-02-04 LAB — TSH: TSH: 1.5 u[IU]/mL (ref 0.35–4.50)

## 2020-02-04 NOTE — Assessment & Plan Note (Signed)
had a flareup about a month ago.  Symptoms have resolved.  Discussed vestibular rehab though given the symptoms have resolved will defer for the time being.

## 2020-02-04 NOTE — Assessment & Plan Note (Signed)
Stable without meds.  Check PSA.

## 2020-02-04 NOTE — Progress Notes (Signed)
Chief Complaint:  Reginald Green is a 75 y.o. male who presents today for his annual comprehensive physical exam.    Assessment/Plan:  Chronic Problems Addressed Today: BPH with obstruction/lower urinary tract symptoms Stable without meds.  Check PSA.  Atrial fibrillation (HCC) Stable.  Rate controlled on metoprolol and anticoagulated on Xarelto.  History of gout Check uric acid.  No recent flares.  COPD (chronic obstructive pulmonary disease) (HCC) Stable.  Continue Advair.  Hypertension At goal.  Continue lisinopril 10 mg daily and metoprolol tartrate 12.5 mg daily.  Check labs.  Osteoarthritis Avoid NSAIDs.  Can use over-the-counter Tylenol.  Can also use over-the-counter Voltaren gel.  Hyperglycemia   Check A1c.  Vertigo had a flareup about a month ago.  Symptoms have resolved.  Discussed vestibular rehab though given the symptoms have resolved will defer for the time being.  Hyperlipidemia Check lipids.  Continue Lipitor 20 mg daily.   Body mass index is 27.52 kg/m. / Overweight    Preventative Healthcare: Check labs.  Up-to-date on colon cancer screening.  Up-to-date on vaccines.  Patient Counseling(The following topics were reviewed and/or handout was given):  -Nutrition: Stressed importance of moderation in sodium/caffeine intake, saturated fat and cholesterol, caloric balance, sufficient intake of fresh fruits, vegetables, and fiber.  -Stressed the importance of regular exercise.   -Substance Abuse: Discussed cessation/primary prevention of tobacco, alcohol, or other drug use; driving or other dangerous activities under the influence; availability of treatment for abuse.   -Injury prevention: Discussed safety belts, safety helmets, smoke detector, smoking near bedding or upholstery.   -Sexuality: Discussed sexually transmitted diseases, partner selection, use of condoms, avoidance of unintended pregnancy and contraceptive alternatives.   -Dental health:  Discussed importance of regular tooth brushing, flossing, and dental visits.  -Health maintenance and immunizations reviewed. Please refer to Health maintenance section.  Return to care in 1 year for next preventative visit.     Subjective:  HPI:  He has no acute complaints today.   Lifestyle Diet: Balanced.  Exercise: Walking regularly.   Depression screen [facility] 2/9 02/04/2020  Decreased Interest 0  Down, Depressed, Hopeless 0  PHQ - 2 Score 0  Altered sleeping -  Tired, decreased energy -  Change in appetite -  Feeling bad or failure about yourself  -  Trouble concentrating -  Moving slowly or fidgety/restless -  Suicidal thoughts -  PHQ-9 Score -  Difficult doing work/chores -    Health Maintenance Due  Topic Date Due  . TETANUS/TDAP  01/27/2020     ROS: Per HPI, otherwise a complete review of systems was negative.   PMH:  The following were reviewed and entered/updated in epic: Past Medical History:  Diagnosis Date  . Chronic atrial fibrillation (HCC)    Multiple failed cardioversions (even on flecanide). Normal LV function by echo in 2009 with LA 31mm, reported nonischemic myoview at that time  . COPD (chronic obstructive pulmonary disease) (HCC)   . Gilbert's disease   . Gout   . Hyperlipidemia   . Hypertension    Patient Active Problem List   Diagnosis Date Noted  . Hyperglycemia 02/04/2020  . Osteoarthritis 02/04/2020  . Actinic keratoses 01/27/2017  . Gilbert's disease   . COPD (chronic obstructive pulmonary disease) (HCC) 03/28/2015  . Encounter for therapeutic drug monitoring 02/26/2013  . Hypertension 01/29/2011  . Vertigo 01/10/2011  . History of gout 01/23/2009  . Hyperlipidemia 01/23/2009  . Atrial fibrillation (HCC) 02/02/2007  . Asthma 02/02/2007  . BPH with  obstruction/lower urinary tract symptoms 02/02/2007   Past Surgical History:  Procedure Laterality Date  . APPENDECTOMY     At time of hernia repair in 1979  . COLONOSCOPY  2011    neg, [facility], Due 2021   . FLEXIBLE SIGMOIDOSCOPY  05/12/1997  . HERNIA REPAIR     Undescended testicle brought down at this time in 1979  . TONSILLECTOMY      Family History  Problem Relation Age of Onset  . Stroke Mother 75  . Hypertension Mother   . Heart attack Father 63  . Heart attack Maternal Uncle 55  . Diabetes Maternal Grandmother   . Asthma Neg Hx   . COPD Neg Hx     Medications- reviewed and updated Current Outpatient Medications  Medication Sig Dispense Refill  . allopurinol (ZYLOPRIM) 300 MG tablet TAKE 1/2 TAKE BY MOUTH DAILY 45 tablet 0  . atorvastatin (LIPITOR) 20 MG tablet TAKE 1 TABLET BY MOUTH 4 TIMES WEEKLY 52 tablet 1  . fluticasone (FLONASE) 50 MCG/ACT nasal spray SHAKE GENTLY BEFORE USE AND SPRAY ONE SPRAY IN EACH NOSTRIL TWICE DAILY 48 mL 3  . Fluticasone-Salmeterol (ADVAIR) 250-50 MCG/DOSE AEPB INHALE ONE PUFF BY MOUTH EVERY 12 HOURS 180 each 1  . lisinopril (ZESTRIL) 20 MG tablet TAKE ONE-HALF TABLET BY MOUTH DAILY 45 tablet 2  . metoprolol tartrate (LOPRESSOR) 25 MG tablet TAKE ONE-HALF TABLET BY MOUTH TWO TIMES A DAY 90 tablet 2  . Multiple Vitamin (MULTIVITAMIN) tablet Take 1 tablet by mouth daily.    [name] 20 MG TABS tablet TAKE ONE TABLET BY MOUTH DAILY WITH SUPPER 90 tablet 1   No current facility-administered medications for this visit.    Allergies-reviewed and updated No Known Allergies  Social History   Socioeconomic History  . Marital status: Married    Spouse name: Not on file  . Number of children: 3  . Years of education: 5  . Highest education level: Not on file  Occupational History  . Occupation: Retired   Tobacco Use  . Smoking status: Former Smoker    Packs/day: 2.00    Years: 22.00    Pack years: 44.00    Types: Cigarettes    Quit date: 02/01/1987    Years since quitting: 33.0  . Smokeless tobacco: Never Used  . Tobacco comment: smoked 1967-1989 , up to 2 ppd  Vaping Use  . Vaping Use: Never used   Substance and Sexual Activity  . Alcohol use: Yes    Alcohol/week: 14.0 standard drinks    Types: 14 Cans of beer per week  . Drug use: No  . Sexual activity: Never  Other Topics Concern  . Not on file  Social History Narrative   Fun: Work out in the yard, chase [employer].    Social Determinants of Health   Financial Resource Strain: Not on file  Food Insecurity: Not on file  Transportation Needs: Not on file  Physical Activity: Not on file  Stress: Not on file  Social Connections: Not on file        Objective:  Physical Exam: BP 122/80   Pulse 65   Temp (!) 97.5 F (36.4 C)   Ht 6\' 3"  (1.905 m)   Wt 220 lb 3.2 oz (99.9 kg)   SpO2 95%   BMI 27.52 kg/m   Body mass index is 27.52 kg/m. Wt Readings from Last 3 Encounters:  02/04/20 220 lb 3.2 oz (99.9 kg)  05/05/19 215 lb (97.5 kg)  02/02/19 215 lb (97.5 kg)   Gen: NAD, resting comfortably HEENT: TMs normal bilaterally. OP clear. No thyromegaly noted.  CV: RRR with no murmurs appreciated Pulm: NWOB, CTAB with no crackles, wheezes, or rhonchi GI: Normal bowel sounds present. Soft, Nontender, Nondistended. MSK: no edema, cyanosis, or clubbing noted Skin: warm, dry Neuro: CN2-12 grossly intact. Strength 5/5 in upper and lower extremities. Reflexes symmetric and intact bilaterally.  Psych: Normal affect and thought content     Lenox Bink M. Jimmey Ralph, MD 02/04/2020 10:31 AM

## 2020-02-04 NOTE — Progress Notes (Signed)
fI have personally reviewed the Medicare Annual Wellness Visit and agree with the documentation.  Katina Degree. Jimmey Ralph, MD 02/04/2020 11:15 AM

## 2020-02-04 NOTE — Patient Instructions (Signed)
It was very nice to see you today!  We will check blood work today.  No changes today.  Please continue working on diet and exercise.  Please let me know if your vertigo comes back.  I will see you back in a year for your next physical.  Please come back to see me sooner if needed.  Take care, Dr Jimmey Ralph  Please try these tips to maintain a healthy lifestyle:   Eat at least 3 REAL meals and 1-2 snacks per day.  Aim for no more than 5 hours between eating.  If you eat breakfast, please do so within one hour of getting up.    Each meal should contain half fruits/vegetables, one quarter protein, and one quarter carbs (no bigger than a computer mouse)   Cut down on sweet beverages. This includes juice, soda, and sweet tea.     Drink at least 1 glass of water with each meal and aim for at least 8 glasses per day   Exercise at least 150 minutes every week.    Preventive Care 38 Years and Older, Male Preventive care refers to lifestyle choices and visits with your health care provider that can promote health and wellness. This includes:  A yearly physical exam. This is also called an annual wellness visit.  Regular dental and eye exams.  Immunizations.  Screening for certain conditions.  Healthy lifestyle choices, such as: ? Eating a healthy diet. ? Getting regular exercise. ? Not using drugs or products that contain nicotine and tobacco. ? Limiting alcohol use. What can I expect for my preventive care visit? Physical exam Your health care provider will check your:  Height and weight. These may be used to calculate your BMI (body mass index). BMI is a measurement that tells if you are at a healthy weight.  Heart rate and blood pressure.  Body temperature.  Skin for abnormal spots. Counseling Your health care provider may ask you questions about your:  Past medical problems.  Family's medical history.  Alcohol, tobacco, and drug use.  Emotional  well-being.  Home life and relationship well-being.  Sexual activity.  Diet, exercise, and sleep habits.  History of falls.  Memory and ability to understand (cognition).  Work and work Astronomer.  Access to firearms. What immunizations do I need? Vaccines are usually given at various ages, according to a schedule. Your health care provider will recommend vaccines for you based on your age, medical history, and lifestyle or other factors, such as travel or where you work.   What tests do I need? Blood tests  Lipid and cholesterol levels. These may be checked every 5 years, or more often depending on your overall health.  Hepatitis C test.  Hepatitis B test. Screening  Lung cancer screening. You may have this screening every year starting at age 61 if you have a 30-pack-year history of smoking and currently smoke or have quit within the past 15 years.  Colorectal cancer screening. ? All adults should have this screening starting at age 86 and continuing until age 49. ? Your health care provider may recommend screening at age 43 if you are at increased risk. ? You will have tests every 1-10 years, depending on your results and the type of screening test.  Prostate cancer screening. Recommendations will vary depending on your family history and other risks.  Genital exam to check for testicular cancer or hernias.  Diabetes screening. ? This is done by checking your blood sugar (glucose) after  you have not eaten for a while (fasting). ? You may have this done every 1-3 years.  Abdominal aortic aneurysm (AAA) screening. You may need this if you are a current or former smoker.  STD (sexually transmitted disease) testing, if you are at risk. Follow these instructions at home: Eating and drinking  Eat a diet that includes fresh fruits and vegetables, whole grains, lean protein, and low-fat dairy products. Limit your intake of foods with high amounts of sugar, saturated fats,  and salt.  Take vitamin and mineral supplements as recommended by your health care provider.  Do not drink alcohol if your health care provider tells you not to drink.  If you drink alcohol: ? Limit how much you have to 0-2 drinks a day. ? Be aware of how much alcohol is in your drink. In the [place] one drink equals one 12 oz bottle of beer (355 mL), one 5 oz glass of wine (148 mL), or one 1 oz glass of hard liquor (44 mL).   Lifestyle  Take daily care of your teeth and gums. Brush your teeth every morning and night with fluoride toothpaste. Floss one time each day.  Stay active. Exercise for at least 30 minutes 5 or more days each week.  Do not use any products that contain nicotine or tobacco, such as cigarettes, e-cigarettes, and chewing tobacco. If you need help quitting, ask your health care provider.  Do not use drugs.  If you are sexually active, practice safe sex. Use a condom or other form of protection to prevent STIs (sexually transmitted infections).  Talk with your health care provider about taking a low-dose aspirin or statin.  Find healthy ways to cope with stress, such as: ? Meditation, yoga, or listening to music. ? Journaling. ? Talking to a trusted person. ? Spending time with friends and family. Safety  Always wear your seat belt while driving or riding in a vehicle.  Do not drive: ? If you have been drinking alcohol. Do not ride with someone who has been drinking. ? When you are tired or distracted. ? While texting.  Wear a helmet and other protective equipment during sports activities.  If you have firearms in your house, make sure you follow all gun safety procedures. What's next?  Visit your health care provider once a year for an annual wellness visit.  Ask your health care provider how often you should have your eyes and teeth checked.  Stay up to date on all vaccines. This information is not intended to replace advice given to you by your  health care provider. Make sure you discuss any questions you have with your health care provider. Document Revised: 09/22/2018 Document Reviewed: 12/18/2017 Elsevier Patient Education  2021 [employer].

## 2020-02-04 NOTE — Patient Instructions (Addendum)
Reginald Green , Thank you for taking time to come for your Medicare Wellness Visit. I appreciate your ongoing commitment to your health goals. Please review the following plan we discussed and let me know if I can assist you in the future.   Screening recommendations/referrals: Colonoscopy: Done 02/18/19 cologuard Recommended yearly ophthalmology/optometry visit for glaucoma screening and checkup Recommended yearly dental visit for hygiene and checkup  Vaccinations: Influenza vaccine: Done 10/07/19 Pneumococcal vaccine: Up to date Tdap vaccine: Due and discussed Shingles vaccine: Completed zoster 02/16/16 & 05/15/16   Covid-19: Completed 1/30,3/1, & 11/04/19  Advanced directives: Copies in chart  Conditions/risks identified: Stay healthy  Next appointment: Follow up in one year for your annual wellness visit.   Preventive Care 13 Years and Older, Male Preventive care refers to lifestyle choices and visits with your health care provider that can promote health and wellness. What does preventive care include?  A yearly physical exam. This is also called an annual well check.  Dental exams once or twice a year.  Routine eye exams. Ask your health care provider how often you should have your eyes checked.  Personal lifestyle choices, including:  Daily care of your teeth and gums.  Regular physical activity.  Eating a healthy diet.  Avoiding tobacco and drug use.  Limiting alcohol use.  Practicing safe sex.  Taking low doses of aspirin every day.  Taking vitamin and mineral supplements as recommended by your health care provider. What happens during an annual well check? The services and screenings done by your health care provider during your annual well check will depend on your age, overall health, lifestyle risk factors, and family history of disease. Counseling  Your health care provider may ask you questions about your:  Alcohol use.  Tobacco use.  Drug  use.  Emotional well-being.  Home and relationship well-being.  Sexual activity.  Eating habits.  History of falls.  Memory and ability to understand (cognition).  Work and work Astronomer. Screening  You may have the following tests or measurements:  Height, weight, and BMI.  Blood pressure.  Lipid and cholesterol levels. These may be checked every 5 years, or more frequently if you are over 19 years old.  Skin check.  Lung cancer screening. You may have this screening every year starting at age 74 if you have a 30-pack-year history of smoking and currently smoke or have quit within the past 15 years.  Fecal occult blood test (FOBT) of the stool. You may have this test every year starting at age 49.  Flexible sigmoidoscopy or colonoscopy. You may have a sigmoidoscopy every 5 years or a colonoscopy every 10 years starting at age 80.  Prostate cancer screening. Recommendations will vary depending on your family history and other risks.  Hepatitis C blood test.  Hepatitis B blood test.  Sexually transmitted disease (STD) testing.  Diabetes screening. This is done by checking your blood sugar (glucose) after you have not eaten for a while (fasting). You may have this done every 1-3 years.  Abdominal aortic aneurysm (AAA) screening. You may need this if you are a current or former smoker.  Osteoporosis. You may be screened starting at age 40 if you are at high risk. Talk with your health care provider about your test results, treatment options, and if necessary, the need for more tests. Vaccines  Your health care provider may recommend certain vaccines, such as:  Influenza vaccine. This is recommended every year.  Tetanus, diphtheria, and acellular pertussis (Tdap,  Td) vaccine. You may need a Td booster every 10 years.  Zoster vaccine. You may need this after age 63.  Pneumococcal 13-valent conjugate (PCV13) vaccine. One dose is recommended after age  25.  Pneumococcal polysaccharide (PPSV23) vaccine. One dose is recommended after age 35. Talk to your health care provider about which screenings and vaccines you need and how often you need them. This information is not intended to replace advice given to you by your health care provider. Make sure you discuss any questions you have with your health care provider. Document Released: 01/20/2015 Document Revised: 09/13/2015 Document Reviewed: 10/25/2014 Elsevier Interactive Patient Education  2017 [place] Prevention in the Home Falls can cause injuries. They can happen to people of all ages. There are many things you can do to make your home safe and to help prevent falls. What can I do on the outside of my home?  Regularly fix the edges of walkways and driveways and fix any cracks.  Remove anything that might make you trip as you walk through a door, such as a raised step or threshold.  Trim any bushes or trees on the path to your home.  Use bright outdoor lighting.  Clear any walking paths of anything that might make someone trip, such as rocks or tools.  Regularly check to see if handrails are loose or broken. Make sure that both sides of any steps have handrails.  Any raised decks and porches should have guardrails on the edges.  Have any leaves, snow, or ice cleared regularly.  Use sand or salt on walking paths during winter.  Clean up any spills in your garage right away. This includes oil or grease spills. What can I do in the bathroom?  Use night lights.  Install grab bars by the toilet and in the tub and shower. Do not use towel bars as grab bars.  Use non-skid mats or decals in the tub or shower.  If you need to sit down in the shower, use a plastic, non-slip stool.  Keep the floor dry. Clean up any water that spills on the floor as soon as it happens.  Remove soap buildup in the tub or shower regularly.  Attach bath mats securely with double-sided  non-slip rug tape.  Do not have throw rugs and other things on the floor that can make you trip. What can I do in the bedroom?  Use night lights.  Make sure that you have a light by your bed that is easy to reach.  Do not use any sheets or blankets that are too big for your bed. They should not hang down onto the floor.  Have a firm chair that has side arms. You can use this for support while you get dressed.  Do not have throw rugs and other things on the floor that can make you trip. What can I do in the kitchen?  Clean up any spills right away.  Avoid walking on wet floors.  Keep items that you use a lot in easy-to-reach places.  If you need to reach something above you, use a strong step stool that has a grab bar.  Keep electrical cords out of the way.  Do not use floor polish or wax that makes floors slippery. If you must use wax, use non-skid floor wax.  Do not have throw rugs and other things on the floor that can make you trip. What can I do with my stairs?  Do not  leave any items on the stairs.  Make sure that there are handrails on both sides of the stairs and use them. Fix handrails that are broken or loose. Make sure that handrails are as long as the stairways.  Check any carpeting to make sure that it is firmly attached to the stairs. Fix any carpet that is loose or worn.  Avoid having throw rugs at the top or bottom of the stairs. If you do have throw rugs, attach them to the floor with carpet tape.  Make sure that you have a light switch at the top of the stairs and the bottom of the stairs. If you do not have them, ask someone to add them for you. What else can I do to help prevent falls?  Wear shoes that:  Do not have high heels.  Have rubber bottoms.  Are comfortable and fit you well.  Are closed at the toe. Do not wear sandals.  If you use a stepladder:  Make sure that it is fully opened. Do not climb a closed stepladder.  Make sure that both  sides of the stepladder are locked into place.  Ask someone to hold it for you, if possible.  Clearly mark and make sure that you can see:  Any grab bars or handrails.  First and last steps.  Where the edge of each step is.  Use tools that help you move around (mobility aids) if they are needed. These include:  Canes.  Walkers.  Scooters.  Crutches.  Turn on the lights when you go into a dark area. Replace any light bulbs as soon as they burn out.  Set up your furniture so you have a clear path. Avoid moving your furniture around.  If any of your floors are uneven, fix them.  If there are any pets around you, be aware of where they are.  Review your medicines with your doctor. Some medicines can make you feel dizzy. This can increase your chance of falling. Ask your doctor what other things that you can do to help prevent falls. This information is not intended to replace advice given to you by your health care provider. Make sure you discuss any questions you have with your health care provider. Document Released: 10/20/2008 Document Revised: 06/01/2015 Document Reviewed: 01/28/2014 Elsevier Interactive Patient Education  2017 [employer].

## 2020-02-04 NOTE — Assessment & Plan Note (Signed)
Check uric acid.  No recent flares. 

## 2020-02-04 NOTE — Assessment & Plan Note (Signed)
Stable.  Rate controlled on metoprolol and anticoagulated on Xarelto.

## 2020-02-04 NOTE — Assessment & Plan Note (Signed)
Avoid NSAIDs.  Can use over-the-counter Tylenol.  Can also use over-the-counter Voltaren gel.

## 2020-02-04 NOTE — Assessment & Plan Note (Signed)
Check lipids.  Continue Lipitor 20 mg daily. 

## 2020-02-04 NOTE — Assessment & Plan Note (Signed)
Stable.  Continue Advair. 

## 2020-02-04 NOTE — Assessment & Plan Note (Signed)
At goal.  Continue lisinopril 10 mg daily and metoprolol tartrate 12.5 mg daily.  Check labs.

## 2020-02-04 NOTE — Progress Notes (Signed)
Subjective:   Reginald Green is a 75 y.o. male who presents for Medicare Annual/Subsequent preventive examination.  Review of Systems     Cardiac Risk Factors include: advanced age (>68men, >51 women);hypertension;dyslipidemia;male gender     Objective:    Today's Vitals   02/04/20 1028  BP: 120/80  Pulse: 82  SpO2: 99%  Weight: 220 lb 3.2 oz (99.9 kg)   Body mass index is 27.52 kg/m.  Advanced Directives 02/04/2020 02/02/2019 01/24/2017 12/13/2014 01/10/2011  Does Patient Have a Medical Advance Directive? Yes Yes Yes No Patient has advance directive, copy not in chart  Type of Advance Directive Healthcare Power of [place];Living will Living will;Healthcare Power of [employer] Power of [place];Living will - Living will  Does patient want to make changes to medical advance directive? - No - Patient declined No - Patient declined - -  Copy of Healthcare Power of Attorney in Chart? Yes - validated most recent copy scanned in chart (See row information) Yes - validated most recent copy scanned in chart (See row information) No - copy requested - -  Pre-existing out of facility DNR order (yellow form or pink MOST form) - - - - No    Current Medications (verified) Outpatient Encounter Medications as of 02/04/2020  Medication Sig  . allopurinol (ZYLOPRIM) 300 MG tablet TAKE 1/2 TAKE BY MOUTH DAILY  . atorvastatin (LIPITOR) 20 MG tablet TAKE 1 TABLET BY MOUTH 4 TIMES WEEKLY  . DENTA 5000 PLUS 1.1 % CREA dental cream   . fluticasone (FLONASE) 50 MCG/ACT nasal spray SHAKE GENTLY BEFORE USE AND SPRAY ONE SPRAY IN EACH NOSTRIL TWICE DAILY  . Fluticasone-Salmeterol (ADVAIR) 250-50 MCG/DOSE AEPB INHALE ONE PUFF BY MOUTH EVERY 12 HOURS  . lisinopril (ZESTRIL) 20 MG tablet TAKE ONE-HALF TABLET BY MOUTH DAILY  . metoprolol tartrate (LOPRESSOR) 25 MG tablet TAKE ONE-HALF TABLET BY MOUTH TWO TIMES A DAY  . Multiple Vitamin (MULTIVITAMIN) tablet Take 1 tablet by mouth daily.  Reginald Green  20 MG TABS tablet TAKE ONE TABLET BY MOUTH DAILY WITH SUPPER   No facility-administered encounter medications on file as of 02/04/2020.    Allergies (verified) Patient has no known allergies.   History: Past Medical History:  Diagnosis Date  . Chronic atrial fibrillation (HCC)    Multiple failed cardioversions (even on flecanide). Normal LV function by echo in 2009 with LA 76mm, reported nonischemic myoview at that time  . COPD (chronic obstructive pulmonary disease) (HCC)   . Gilbert's disease   . Gout   . Hyperlipidemia   . Hypertension    Past Surgical History:  Procedure Laterality Date  . APPENDECTOMY     At time of hernia repair in 1979  . COLONOSCOPY  2011   neg, [facility], Due 2021   . FLEXIBLE SIGMOIDOSCOPY  05/12/1997  . HERNIA REPAIR     Undescended testicle brought down at this time in 1979  . TONSILLECTOMY     Family History  Problem Relation Age of Onset  . Stroke Mother 19  . Hypertension Mother   . Heart attack Father 38  . Heart attack Maternal Uncle 55  . Diabetes Maternal Grandmother   . Asthma Neg Hx   . COPD Neg Hx    Social History   Socioeconomic History  . Marital status: Married    Spouse name: Not on file  . Number of children: 3  . Years of education: 39  . Highest education level: Not on file  Occupational History  .  Occupation: Retired   Tobacco Use  . Smoking status: Former Smoker    Packs/day: 2.00    Years: 22.00    Pack years: 44.00    Types: Cigarettes    Quit date: 02/01/1987    Years since quitting: 33.0  . Smokeless tobacco: Never Used  . Tobacco comment: smoked 1967-1989 , up to 2 ppd  Vaping Use  . Vaping Use: Never used  Substance and Sexual Activity  . Alcohol use: Yes    Alcohol/week: 14.0 standard drinks    Types: 14 Cans of beer per week  . Drug use: No  . Sexual activity: Never  Other Topics Concern  . Not on file  Social History Narrative   Fun: Work out in the yard, chase [employer].    Social  Determinants of Health   Financial Resource Strain: Low Risk   . Difficulty of Paying Living Expenses: Not hard at all  Food Insecurity: No Food Insecurity  . Worried About Programme researcher, broadcasting/film/video in the Last Year: Never true  . Ran Out of Food in the Last Year: Never true  Transportation Needs: No Transportation Needs  . Lack of Transportation (Medical): No  . Lack of Transportation (Non-Medical): No  Physical Activity: Sufficiently Active  . Days of Exercise per Week: 7 days  . Minutes of Exercise per Session: 60 min  Stress: No Stress Concern Present  . Feeling of Stress : Not at all  Social Connections: Moderately Isolated  . Frequency of Communication with Friends and Family: More than three times a week  . Frequency of Social Gatherings with Friends and Family: Twice a week  . Attends Religious Services: Never  . Active Member of Clubs or Organizations: No  . Attends Banker Meetings: Never  . Marital Status: Married    Tobacco Counseling Counseling given: Not Answered Comment: smoked [phone] , up to 2 ppd   Clinical Intake:  Pre-visit preparation completed: Yes  Pain : No/denies pain     BMI - recorded: 27.52 Nutritional Risks: None Diabetes: No  How often do you need to have someone help you when you read instructions, pamphlets, or other written materials from your doctor or pharmacy?: 1 - Never  Diabetic?No  Interpreter Needed?: No  Information entered by :: Lanier Ensign, LPN   Activities of Daily Living In your present state of health, do you have any difficulty performing the following activities: 02/04/2020 02/04/2020  Hearing? Y N  Comment mild -  Vision? N N  Difficulty concentrating or making decisions? N N  Walking or climbing stairs? N N  Dressing or bathing? N N  Doing errands, shopping? N N  Preparing Food and eating ? N -  Using the Toilet? N -  In the past six months, have you accidently leaked urine? N -  Do you have  problems with loss of bowel control? N -  Managing your Medications? N -  Managing your Finances? N -  Housekeeping or managing your Housekeeping? N -  Some recent data might be hidden    Patient Care Team: Ardith Dark, MD as PCP - General (Family Medicine) Mia Creek, MD as Consulting Physician (Ophthalmology) Wendall Stade, MD as Consulting Physician (Cardiology)  Indicate any recent Medical Services you may have received from other than [facility] providers in the past year (date may be approximate).     Assessment:   This is a routine wellness examination for Naszir.  Hearing/Vision screen  Hearing Screening  125Hz  250Hz  500Hz  1000Hz  2000Hz  3000Hz  4000Hz  6000Hz  8000Hz   Right ear:           Left ear:           Comments: Pt mild loss  Vision Screening Comments: Pt doesn't follow up with eye care, encourage pt to follow up  Dietary issues and exercise activities discussed: Current Exercise Habits: Home exercise routine, Type of exercise: stretching;walking, Time (Minutes): > 60, Frequency (Times/Week): 7, Weekly Exercise (Minutes/Week): 0  Goals    . Maintain current health status.    . Patient Stated     Stay healthy      Depression Screen PHQ 2/9 Scores 02/04/2020 02/04/2020 02/02/2019 01/24/2017 01/17/2016 05/09/2014  PHQ - 2 Score 0 0 0 0 0 0  PHQ- 9 Score - - - 0 - -    Fall Risk Fall Risk  02/04/2020 02/04/2020 02/02/2019 01/28/2018 01/24/2017  Falls in the past year? 0 0 0 0 No  Number falls in past yr: 0 - 0 - -  Injury with Fall? 0 - 0 - -  Risk for fall due to : Impaired vision;Impaired balance/gait - - - -  Follow up Falls prevention discussed - Falls evaluation completed;Education provided;Falls prevention discussed - -    FALL RISK PREVENTION PERTAINING TO THE HOME:  Any stairs in or around the home? No  If so, are there any without handrails? No  Home free of loose throw rugs in walkways, pet beds, electrical cords, etc? Yes  Adequate lighting in your  home to reduce risk of falls? Yes   ASSISTIVE DEVICES UTILIZED TO PREVENT FALLS:  Life alert? No  Use of a cane, walker or w/c? No  Grab bars in the bathroom? No  Shower chair or bench in shower? No  Elevated toilet seat or a handicapped toilet? Yes   TIMED UP AND GO:  Was the test performed? Yes .  Length of time to ambulate 10 feet:   sec.   Gait steady and fast without use of assistive device  Cognitive Function:     6CIT Screen 02/04/2020 02/02/2019  What Year? 0 points 0 points  What month? 0 points 0 points  What time? - 0 points  Count back from 20 0 points 0 points  Months in reverse 0 points 0 points  Repeat phrase 0 points 0 points  Total Score - 0    Immunizations Immunization History  Administered Date(s) Administered  . Influenza, High Dose Seasonal PF 10/15/2013, 09/17/2018  . Influenza, Seasonal, Injecte, Preservative Fre 09/24/2012  . Influenza-Unspecified 10/17/2014, 10/20/2015, 10/15/2016, 10/12/2017, 10/07/2019  . Moderna Sars-Covid-2 Vaccination 02/06/2019, 03/08/2019, 11/04/2019  . Pneumococcal Conjugate-13 05/09/2014  . Pneumococcal Polysaccharide-23 05/05/2012  . Td 01/26/2010  . Zoster 06/25/2012, 02/16/2016, 05/15/2016    TDAP status: Up to date  Flu Vaccine status: Up to date Done 10/07/19  Pneumococcal vaccine status: Up to date  Covid-19 vaccine status: Completed vaccines  Qualifies for Shingles Vaccine? Yes   Zostavax completed Yes   Shingrix Completed?: Yes  Screening Tests Health Maintenance  Topic Date Due  . TETANUS/TDAP  01/27/2020  . COVID-19 Vaccine (4 - Booster for Moderna series) 05/04/2020  . Fecal DNA (Cologuard)  02/17/2022  . INFLUENZA VACCINE  Completed  . Hepatitis C Screening  Completed  . PNA vac Low Risk Adult  Completed    Health Maintenance  Health Maintenance Due  Topic Date Due  . TETANUS/TDAP  01/27/2020    Colorectal cancer screening: Type of screening:  Cologuard. Completed 02/18/19. Repeat  every 3 years   Additional Screening:  Hepatitis C Screening:  Completed 12/23/14  Vision Screening: Recommended annual ophthalmology exams for early detection of glaucoma and other disorders of the eye. Is the patient up to date with their annual eye exam?  No  Who is the provider or what is the name of the office in which the patient attends annual eye exams? Pt doesn't have provider and declines at this time    Dental Screening: Recommended annual dental exams for proper oral hygiene  Community Resource Referral / Chronic Care Management: CRR required this visit?  No   CCM required this visit?  No      Plan:     I have personally reviewed and noted the following in the patient's chart:   . Medical and social history . Use of alcohol, tobacco or illicit drugs  . Current medications and supplements . Functional ability and status . Nutritional status . Physical activity . Advanced directives . List of other physicians . Hospitalizations, surgeries, and ER visits in previous 12 months . Vitals . Screenings to include cognitive, depression, and falls . Referrals and appointments  In addition, I have reviewed and discussed with patient certain preventive protocols, quality metrics, and best practice recommendations. A written personalized care plan for preventive services as well as general preventive health recommendations were provided to patient.     Marzella Schlein, LPN   1/70/0174   Nurse Notes: None

## 2020-02-04 NOTE — Assessment & Plan Note (Signed)
Check A1c. 

## 2020-02-07 NOTE — Progress Notes (Signed)
Please inform patient of the following:  Labs are all STABLE. Would like for him to keep up the good work and we can recheck in a year or so.  Katina Degree. Jimmey Ralph, MD 02/07/2020 8:34 AM

## 2020-02-08 ENCOUNTER — Other Ambulatory Visit: Payer: Self-pay | Admitting: Family Medicine

## 2020-02-15 ENCOUNTER — Other Ambulatory Visit: Payer: Self-pay | Admitting: Cardiovascular Disease

## 2020-02-15 NOTE — Telephone Encounter (Signed)
Prescription refill request for Xarelto received.   Indication: Afib Last office visit: Nishan, 05/05/2019 Weight: 99.9 kg  Age: 75 yo  Scr: 1.0, 02/04/2020 CrCl: 94 ml/min   Prescription refill sent.

## 2020-03-07 DIAGNOSIS — H2513 Age-related nuclear cataract, bilateral: Secondary | ICD-10-CM | POA: Diagnosis not present

## 2020-03-09 ENCOUNTER — Encounter: Payer: Self-pay | Admitting: Family Medicine

## 2020-03-11 ENCOUNTER — Other Ambulatory Visit: Payer: Self-pay | Admitting: Cardiovascular Disease

## 2020-03-12 ENCOUNTER — Other Ambulatory Visit: Payer: Self-pay | Admitting: Family Medicine

## 2020-03-21 ENCOUNTER — Other Ambulatory Visit: Payer: Self-pay | Admitting: Cardiovascular Disease

## 2020-04-16 ENCOUNTER — Other Ambulatory Visit: Payer: Self-pay | Admitting: Cardiovascular Disease

## 2020-05-14 ENCOUNTER — Other Ambulatory Visit: Payer: Self-pay | Admitting: Family Medicine

## 2020-06-06 DIAGNOSIS — L570 Actinic keratosis: Secondary | ICD-10-CM | POA: Diagnosis not present

## 2020-06-06 DIAGNOSIS — L720 Epidermal cyst: Secondary | ICD-10-CM | POA: Diagnosis not present

## 2020-06-06 DIAGNOSIS — L814 Other melanin hyperpigmentation: Secondary | ICD-10-CM | POA: Diagnosis not present

## 2020-06-06 DIAGNOSIS — L821 Other seborrheic keratosis: Secondary | ICD-10-CM | POA: Diagnosis not present

## 2020-06-06 DIAGNOSIS — D1801 Hemangioma of skin and subcutaneous tissue: Secondary | ICD-10-CM | POA: Diagnosis not present

## 2020-06-13 ENCOUNTER — Encounter: Payer: Self-pay | Admitting: Family Medicine

## 2020-06-14 ENCOUNTER — Other Ambulatory Visit: Payer: Self-pay | Admitting: Cardiovascular Disease

## 2020-07-15 NOTE — Progress Notes (Deleted)
Evaluation Performed:  Follow-up visit  Date:  07/15/2020   ID:  Reginald Green, DOB March 03, 1945, MRN [id number]  PCP:  Ardith Dark, MD  Cardiologist:  Eden Emms    History of Present Illness:    75 y.o. history of chronic afib on Xarelto, HTN, HLD and COPD. 12/15/15 had left gluteal hematoma after trauma No neurologic issues when anticoagulation held. Testing with echo/myovue in 2013 showed no evidence CAD and normal EF  I take care of his wife Reginald Green as well who has aortic stenosis    No issues Enjoys following grand daughters softball games     Brother moved  here from [place] so daughter can go to [place]  But she ended up at [employer]  TTE ordered last visit 04/2019 but not done   ***   The patient does not have symptoms concerning for COVID-19 infection (fever, chills, cough, or new shortness of breath).    Past Medical History:  Diagnosis Date   Chronic atrial fibrillation (HCC)    Multiple failed cardioversions (even on flecanide). Normal LV function by echo in 2009 with LA 22mm, reported nonischemic myoview at that time   COPD (chronic obstructive pulmonary disease) (HCC)    Gilbert's disease    Gout    Hyperlipidemia    Hypertension    Past Surgical History:  Procedure Laterality Date   APPENDECTOMY     At time of hernia repair in 1979   COLONOSCOPY  2011   neg, [facility], Due 2021    FLEXIBLE SIGMOIDOSCOPY  05/12/1997   HERNIA REPAIR     Undescended testicle brought down at this time in 1979   TONSILLECTOMY       No outpatient medications have been marked as taking for the 07/24/20 encounter (Appointment) with Wendall Stade, MD.     Allergies:   Patient has no known allergies.   Social History   Tobacco Use   Smoking status: Former    Packs/day: 2.00    Years: 22.00    Pack years: 44.00    Types: Cigarettes    Quit date: 02/01/1987    Years since quitting: 33.4   Smokeless tobacco: Never   Tobacco comments:    smoked 1967-1989 ,  up to 2 ppd  Vaping Use   Vaping Use: Never used  Substance Use Topics   Alcohol use: Yes    Alcohol/week: 14.0 standard drinks    Types: 14 Cans of beer per week   Drug use: No     Family Hx: The patient's family history includes Diabetes in his maternal grandmother; Heart attack (age of onset: 15) in his maternal uncle; Heart attack (age of onset: 66) in his father; Hypertension in his mother; Stroke (age of onset: 63) in his mother. There is no history of Asthma or COPD.  ROS:   Please see the history of present illness.     All other systems reviewed and are negative.   Prior CV studies:   The following studies were reviewed today:  Myovue 01/17/11 Echo 01/15/11  Labs/Other Tests and Data Reviewed:    EKG:  04/25/17 afib rate 69 otherwise normal Afib/flutter RBBB rate 44   Recent Labs: 02/04/2020: ALT 20; BUN 22; Creatinine, Ser 1.00; Hemoglobin 14.5; Platelets 228.0; Potassium 4.7; Sodium 137; TSH 1.50   Recent Lipid Panel Lab Results  Component Value Date/Time   CHOL 148 02/04/2020 10:46 AM   CHOL 149 05/09/2014 10:03 AM   TRIG 86.0  02/04/2020 10:46 AM   TRIG 75 05/09/2014 10:03 AM   TRIG 77 01/14/2006 10:29 AM   HDL 64.00 02/04/2020 10:46 AM   HDL 68 05/09/2014 10:03 AM   CHOLHDL 2 02/04/2020 10:46 AM   LDLCALC 67 02/04/2020 10:46 AM   LDLCALC 66 05/09/2014 10:03 AM    Wt Readings from Last 3 Encounters:  02/04/20 99.9 kg  02/04/20 99.9 kg  05/05/19 97.5 kg     Objective:    Vital Signs:  There were no vitals taken for this visit.   Affect appropriate Healthy:  appears stated age HEENT: normal Neck supple with no adenopathy JVP normal no bruits no thyromegaly Lungs clear with no wheezing and good diaphragmatic motion Heart:  S1/S2 no murmur, no rub, gallop or click PMI normal Abdomen: benighn, BS positve, no tenderness, no AAA no bruit.  No HSM or HJR Distal pulses intact with no bruits No edema Neuro non-focal Skin warm and dry No muscular  weakness    ASSESSMENT & PLAN:    Chronic afib:  Good rate control and anticoagulation renal function normal stable Update echo  HLD:  On statin labs with primary LDL at goal 67 02/04/20  HTN:  Well controlled.  Continue current medications and low sodium Dash type diet.   Gout:  On zyloprim avoid diuretics Uric acid normal 5.6 02/04/20 COPD: no active wheezing on Advair   Medication Adjustments/Labs and Tests Ordered: Current medicines are reviewed at length with the patient today.  Concerns regarding medicines are outlined above.   Tests Ordered:  Echo for afib   Medication Changes: No orders of the defined types were placed in this encounter.   Disposition:  Follow up  in a year  I    Signed, Charlton Haws, MD  07/15/2020 2:51 PM    [facility]

## 2020-07-24 ENCOUNTER — Ambulatory Visit: Payer: Medicare PPO | Admitting: Cardiovascular Disease

## 2020-08-07 DIAGNOSIS — Z1329 Encounter for screening for other suspected endocrine disorder: Secondary | ICD-10-CM | POA: Diagnosis not present

## 2020-08-07 DIAGNOSIS — E785 Hyperlipidemia, unspecified: Secondary | ICD-10-CM | POA: Diagnosis not present

## 2020-08-07 DIAGNOSIS — J449 Chronic obstructive pulmonary disease, unspecified: Secondary | ICD-10-CM | POA: Diagnosis not present

## 2020-08-07 DIAGNOSIS — M109 Gout, unspecified: Secondary | ICD-10-CM | POA: Diagnosis not present

## 2020-08-07 DIAGNOSIS — I10 Essential (primary) hypertension: Secondary | ICD-10-CM | POA: Diagnosis not present

## 2020-08-07 DIAGNOSIS — I4891 Unspecified atrial fibrillation: Secondary | ICD-10-CM | POA: Diagnosis not present

## 2020-08-07 DIAGNOSIS — J302 Other seasonal allergic rhinitis: Secondary | ICD-10-CM | POA: Diagnosis not present

## 2020-08-07 DIAGNOSIS — Z1331 Encounter for screening for depression: Secondary | ICD-10-CM | POA: Diagnosis not present

## 2020-08-11 ENCOUNTER — Other Ambulatory Visit: Payer: Self-pay

## 2020-08-11 NOTE — Telephone Encounter (Signed)
Prescription refill request for Xarelto received.  Indication: Afib Last office visit: 05/05/19 Eden Emms) Weight: 99.9kg Age: 75 Scr: 1.00 (02/04/20) CrCl: 92.57ml/min

## 2020-08-14 NOTE — Telephone Encounter (Signed)
Pt has moved to the [facility] and has a Astronomer there. Will deny refill since no longer under Dr. Eden Emms.

## 2020-08-14 NOTE — Telephone Encounter (Signed)
-----   Message from Garald Braver sent at 08/14/2020 10:29 AM EDT ----- Patient is no longer living in [place] and has a Development worker, international aid at [employer] ----- Message ----- From: Memory Dance, RN Sent: 08/11/2020  12:57 PM EDT To: [address] Scheduling  Pt overdue for office visit.

## 2020-08-15 NOTE — Telephone Encounter (Signed)
Received another Xarelto refill request from [facility]; called pt to ensure that he has moved to another county and has another Development worker, international aid and he confirmed. Also, he states he had his Xarelto medication filled by his new PCP and is aware he will have his Cardiologist take over refills. He is aware I will call [facility] to advise them that he has a new Cardiologist in another town and they are filling.    Called the pharmacy and advised that he has moved and has been getting filled by another provider and the pharmacist verbalized understanding as she already knew he had moved.

## 2020-08-21 DIAGNOSIS — R030 Elevated blood-pressure reading, without diagnosis of hypertension: Secondary | ICD-10-CM | POA: Diagnosis not present

## 2020-08-23 DIAGNOSIS — R0600 Dyspnea, unspecified: Secondary | ICD-10-CM | POA: Diagnosis not present

## 2020-10-12 DIAGNOSIS — K4091 Unilateral inguinal hernia, without obstruction or gangrene, recurrent: Secondary | ICD-10-CM | POA: Diagnosis not present

## 2020-10-12 DIAGNOSIS — R112 Nausea with vomiting, unspecified: Secondary | ICD-10-CM | POA: Diagnosis not present

## 2020-10-12 DIAGNOSIS — J449 Chronic obstructive pulmonary disease, unspecified: Secondary | ICD-10-CM | POA: Diagnosis not present

## 2020-10-12 DIAGNOSIS — E785 Hyperlipidemia, unspecified: Secondary | ICD-10-CM | POA: Diagnosis not present

## 2020-10-12 DIAGNOSIS — K5730 Diverticulosis of large intestine without perforation or abscess without bleeding: Secondary | ICD-10-CM | POA: Diagnosis not present

## 2020-10-12 DIAGNOSIS — K4090 Unilateral inguinal hernia, without obstruction or gangrene, not specified as recurrent: Secondary | ICD-10-CM | POA: Diagnosis not present

## 2020-10-12 DIAGNOSIS — I10 Essential (primary) hypertension: Secondary | ICD-10-CM | POA: Diagnosis not present

## 2020-10-12 DIAGNOSIS — N3289 Other specified disorders of bladder: Secondary | ICD-10-CM | POA: Diagnosis not present

## 2020-10-12 DIAGNOSIS — R1031 Right lower quadrant pain: Secondary | ICD-10-CM | POA: Diagnosis not present

## 2020-10-12 DIAGNOSIS — K449 Diaphragmatic hernia without obstruction or gangrene: Secondary | ICD-10-CM | POA: Diagnosis not present

## 2020-10-16 DIAGNOSIS — K4091 Unilateral inguinal hernia, without obstruction or gangrene, recurrent: Secondary | ICD-10-CM | POA: Diagnosis not present

## 2020-10-19 DIAGNOSIS — J449 Chronic obstructive pulmonary disease, unspecified: Secondary | ICD-10-CM | POA: Diagnosis not present

## 2020-10-19 DIAGNOSIS — I10 Essential (primary) hypertension: Secondary | ICD-10-CM | POA: Diagnosis not present

## 2020-10-19 DIAGNOSIS — K4091 Unilateral inguinal hernia, without obstruction or gangrene, recurrent: Secondary | ICD-10-CM | POA: Diagnosis not present

## 2020-10-19 DIAGNOSIS — Z87891 Personal history of nicotine dependence: Secondary | ICD-10-CM | POA: Diagnosis not present

## 2020-10-19 DIAGNOSIS — K4090 Unilateral inguinal hernia, without obstruction or gangrene, not specified as recurrent: Secondary | ICD-10-CM | POA: Diagnosis not present

## 2021-01-30 DIAGNOSIS — L814 Other melanin hyperpigmentation: Secondary | ICD-10-CM | POA: Diagnosis not present

## 2021-01-30 DIAGNOSIS — L821 Other seborrheic keratosis: Secondary | ICD-10-CM | POA: Diagnosis not present

## 2021-01-30 DIAGNOSIS — Z7189 Other specified counseling: Secondary | ICD-10-CM | POA: Diagnosis not present

## 2021-01-30 DIAGNOSIS — L570 Actinic keratosis: Secondary | ICD-10-CM | POA: Diagnosis not present

## 2021-01-30 DIAGNOSIS — Z1283 Encounter for screening for malignant neoplasm of skin: Secondary | ICD-10-CM | POA: Diagnosis not present

## 2021-01-30 DIAGNOSIS — L853 Xerosis cutis: Secondary | ICD-10-CM | POA: Diagnosis not present

## 2021-01-30 DIAGNOSIS — D225 Melanocytic nevi of trunk: Secondary | ICD-10-CM | POA: Diagnosis not present

## 2021-02-05 ENCOUNTER — Encounter: Payer: Medicare PPO | Admitting: Family Medicine

## 2021-02-08 ENCOUNTER — Encounter: Payer: Medicare PPO | Admitting: Family Medicine

## 2021-02-08 ENCOUNTER — Ambulatory Visit: Payer: Medicare PPO

## 2021-02-13 DIAGNOSIS — I4891 Unspecified atrial fibrillation: Secondary | ICD-10-CM | POA: Diagnosis not present

## 2021-02-13 DIAGNOSIS — Z1329 Encounter for screening for other suspected endocrine disorder: Secondary | ICD-10-CM | POA: Diagnosis not present

## 2021-02-13 DIAGNOSIS — E785 Hyperlipidemia, unspecified: Secondary | ICD-10-CM | POA: Diagnosis not present

## 2021-02-13 DIAGNOSIS — M109 Gout, unspecified: Secondary | ICD-10-CM | POA: Diagnosis not present

## 2021-02-13 DIAGNOSIS — I10 Essential (primary) hypertension: Secondary | ICD-10-CM | POA: Diagnosis not present

## 2021-02-19 DIAGNOSIS — Z131 Encounter for screening for diabetes mellitus: Secondary | ICD-10-CM | POA: Diagnosis not present

## 2021-02-19 DIAGNOSIS — J302 Other seasonal allergic rhinitis: Secondary | ICD-10-CM | POA: Diagnosis not present

## 2021-02-19 DIAGNOSIS — Z1329 Encounter for screening for other suspected endocrine disorder: Secondary | ICD-10-CM | POA: Diagnosis not present

## 2021-02-19 DIAGNOSIS — E785 Hyperlipidemia, unspecified: Secondary | ICD-10-CM | POA: Diagnosis not present

## 2021-02-19 DIAGNOSIS — J449 Chronic obstructive pulmonary disease, unspecified: Secondary | ICD-10-CM | POA: Diagnosis not present

## 2021-02-19 DIAGNOSIS — I482 Chronic atrial fibrillation, unspecified: Secondary | ICD-10-CM | POA: Diagnosis not present

## 2021-02-19 DIAGNOSIS — I10 Essential (primary) hypertension: Secondary | ICD-10-CM | POA: Diagnosis not present

## 2021-02-19 DIAGNOSIS — M109 Gout, unspecified: Secondary | ICD-10-CM | POA: Diagnosis not present

## 2021-02-19 DIAGNOSIS — Z1331 Encounter for screening for depression: Secondary | ICD-10-CM | POA: Diagnosis not present

## 2021-10-01 ENCOUNTER — Encounter: Payer: Self-pay | Admitting: *Deleted

## 2021-12-20 ENCOUNTER — Encounter: Payer: Self-pay | Admitting: *Deleted

## 2022-05-08 LAB — COLOGUARD: COLOGUARD: NEGATIVE
